# Patient Record
Sex: Female | Born: 1945
Health system: Southern US, Community
[De-identification: ages and names within clinical notes are randomized; demographics above are authoritative.]

## PROBLEM LIST (undated history)

## (undated) DIAGNOSIS — I10 Essential (primary) hypertension: Secondary | ICD-10-CM

## (undated) DIAGNOSIS — K449 Diaphragmatic hernia without obstruction or gangrene: Secondary | ICD-10-CM

## (undated) DIAGNOSIS — I509 Heart failure, unspecified: Secondary | ICD-10-CM

## (undated) DIAGNOSIS — E119 Type 2 diabetes mellitus without complications: Secondary | ICD-10-CM

## (undated) DIAGNOSIS — E785 Hyperlipidemia, unspecified: Secondary | ICD-10-CM

## (undated) DIAGNOSIS — H409 Unspecified glaucoma: Secondary | ICD-10-CM

## (undated) DIAGNOSIS — K259 Gastric ulcer, unspecified as acute or chronic, without hemorrhage or perforation: Secondary | ICD-10-CM

## (undated) DIAGNOSIS — D649 Anemia, unspecified: Secondary | ICD-10-CM

---

## 2011-08-20 ENCOUNTER — Emergency Department (HOSPITAL_COMMUNITY): Payer: Medicare Other

## 2011-08-20 ENCOUNTER — Emergency Department (HOSPITAL_COMMUNITY)
Admission: EM | Admit: 2011-08-20 | Discharge: 2011-08-20 | Disposition: A | Discharge status: Home / Self Care | Payer: Medicare Other | Attending: Emergency Medicine | Admitting: Emergency Medicine

## 2011-08-20 ENCOUNTER — Encounter (HOSPITAL_COMMUNITY): Payer: Self-pay | Admitting: Cardiology

## 2011-08-20 DIAGNOSIS — R51 Headache: Secondary | ICD-10-CM

## 2011-08-20 DIAGNOSIS — I10 Essential (primary) hypertension: Secondary | ICD-10-CM | POA: Insufficient documentation

## 2011-08-20 DIAGNOSIS — R42 Dizziness and giddiness: Secondary | ICD-10-CM | POA: Insufficient documentation

## 2011-08-20 DIAGNOSIS — R110 Nausea: Secondary | ICD-10-CM | POA: Insufficient documentation

## 2011-08-20 HISTORY — DX: Essential (primary) hypertension: I10

## 2011-08-20 MED ORDER — IBUPROFEN 200 MG PO TABS
400.0000 mg | ORAL_TABLET | Freq: Once | ORAL | Status: AC
  Administered 2011-08-20: 400 mg via ORAL
  Filled 2011-08-20: qty 2

## 2011-08-20 MED ORDER — ACETAMINOPHEN 325 MG PO TABS
975.0000 mg | ORAL_TABLET | Freq: Once | ORAL | Status: AC
  Administered 2011-08-20: 975 mg via ORAL
  Filled 2011-08-20: qty 3

## 2011-08-20 NOTE — ED Notes (Signed)
Pt reports a generalized headache for a couple of days. States that she tried Aleve once without any relief. Denies any vision changes. Pt A&Ox4.

## 2011-08-20 NOTE — ED Provider Notes (Signed)
Medical screening examination/treatment/procedure(s) were performed by non-physician practitioner and as supervising physician I was immediately available for consultation/collaboration.   Rianna Lukes B. Bernette Mayers, MD 08/20/11 [phone]

## 2011-08-20 NOTE — ED Provider Notes (Signed)
History     CSN: [id number]  Arrival date & time 08/20/11  4098   First MD Initiated Contact with Patient 08/20/11 [phone]      Chief Complaint  Patient presents with  . Headache    (Consider location/radiation/quality/duration/timing/severity/associated sxs/prior treatment) HPI  Patient has history of hypertension and non-insulin-dependent diabetes who states that her primary care provider visits her at her home however she cannot remember the primary care providers name stating that she moved to [facility] area within the last year presents to emergency department complaining of headache. Patient states that she has had a headache over the last month intermittently. Patient states that some mornings she'll wake up with a headache and others she will wake up pain free. Patient states that on morning she wakes up with a headache she'll take her blood pressure medication and her diabetes medication and at times the headache goes away. Other times patient notes that she'll take her medications and she'll have ongoing headache. Patient has attempted to take Aleve for his headaches. Patient states the headache will resolve by 10 AM stating that she thinks the Aleve "may help a little." Patient states headache is associated with mild dizziness and mild nausea. Patient denies similar headache and her lifetime stating that over the last month at the first time she's had frequent recurrent headaches. She denies fevers, chills, neck stiffness, loss of coordination, difficulty ambulating, loss of vision, double vision, chest pain, shortness of breath, vomiting, extremity numbness/tingling/weakness, slurred speech, or facial droop. Patient states that when she woke this morning she had a more severe headache and took Aleve without any relief of pain and therefore presents to the emergency department for evaluation of her recurrent headaches.  Past Medical History  Diagnosis Date  . Hypertension     History  reviewed. No pertinent past surgical history.  History reviewed. No pertinent family history.  History  Substance Use Topics  . Smoking status: Not on file  . Smokeless tobacco: Not on file  . Alcohol Use:     OB History    Grav Para Term Preterm Abortions TAB SAB Ect Mult Living                  Review of Systems  All other systems reviewed and are negative.    Allergies  Review of patient's allergies indicates no known allergies.  Home Medications  No current outpatient prescriptions on file.  BP 157/82  Pulse 88  Temp 98.7 F (37.1 C) (Oral)  Resp 18  SpO2 99%  Physical Exam  Nursing note and vitals reviewed. Constitutional: She is oriented to person, place, and time. She appears well-developed and well-nourished. No distress.  HENT:  Head: Normocephalic and atraumatic.  Eyes: Conjunctivae and EOM are normal. Pupils are equal, round, and reactive to light.       No nystagmus  Neck: Normal range of motion. Neck supple.       No meningeal signs.   Cardiovascular: Normal rate, regular rhythm, normal heart sounds and intact distal pulses.  Exam reveals no gallop and no friction rub.   No murmur heard. Pulmonary/Chest: Effort normal and breath sounds normal. No respiratory distress. She has no wheezes. She has no rales. She exhibits no tenderness.  Abdominal: Soft. Bowel sounds are normal. She exhibits no distension and no mass. There is no tenderness. There is no rebound and no guarding.  Musculoskeletal: Normal range of motion. She exhibits no edema and no tenderness.  Neurological: She is  alert and oriented to person, place, and time. No cranial nerve deficit. Coordination normal.       No ataxia. Ambulating without difficulty.   Skin: Skin is warm and dry. No rash noted. She is not diaphoretic. No erythema.  Psychiatric: She has a normal mood and affect.    ED Course  Procedures (including critical care time)  PO tylenol and ibuprofen.   Labs Reviewed -  No data to display Ct Head Wo Contrast  08/20/2011  *RADIOLOGY REPORT*  Clinical Data: Headache, blurred vision, and dizziness for 2 days.  CT HEAD WITHOUT CONTRAST  Technique:  Contiguous axial images were obtained from the base of the skull through the vertex without contrast.  Comparison: None.  Findings: No acute cortical infarct, hemorrhage, or mass lesion is present.  The ventricles are of normal size.  No significant extra- axial fluid collection is present.  The paranasal sinuses and mastoid air cells are clear.  The osseous skull is intact.  IMPRESSION: Negative CT of the head.  Original Report Authenticated By: Jamesetta Orleans. MATTERN, M.D.     1. Headache     Case discussed with Dr. Bernette Mayers who is agreeable with assessment and plan.   MDM  Patient with intermittent recurrent headache x1 month with no neuro focal deficits or neurological findings is ambulating without difficulty. No worrisome signs for infectious with negative meningeal signs and afebrile. No concern for subarachnoid hemorrhage or any other intracranial bleed. Negative CT scan for mass or lesion. Spoke at length with patient about recurrent headaches and the need for primary care and/or headache wellness Center followup for further management of headaches. Patient voices her understanding and is agreeable to plan.        [place], [place] 08/20/11 [phone]

## 2011-08-20 NOTE — Discharge Instructions (Signed)
It is important to take a dose of aleve and or tylenol immediately at onset of headache and then you can alternate with the other medication as needed for ongoing headache. Follow up with your primary care provider to further discuss recurrent headaches and your blood pressure management or may seek additional evaluation and management by [facility] however return to ER at any time for emergent changing or worsening of symptoms.   Headaches, Frequently Asked Questions MIGRAINE HEADACHES Q: What is migraine? What causes it? How can I treat it? A: Generally, migraine headaches begin as a dull ache. Then they develop into a constant, throbbing, and pulsating pain. You may experience pain at the temples. You may experience pain at the front or back of one or both sides of the head. The pain is usually accompanied by a combination of:  Nausea.   Vomiting.   Sensitivity to light and noise.  Some people (about 15%) experience an aura (see below) before an attack. The cause of migraine is believed to be chemical reactions in the brain. Treatment for migraine may include over-the-counter or prescription medications. It may also include self-help techniques. These include relaxation training and biofeedback.  Q: What is an aura? A: About 15% of people with migraine get an "aura". This is a sign of neurological symptoms that occur before a migraine headache. You may see wavy or jagged lines, dots, or flashing lights. You might experience tunnel vision or blind spots in one or both eyes. The aura can include visual or auditory hallucinations (something imagined). It may include disruptions in smell (such as strange odors), taste or touch. Other symptoms include:  Numbness.   A "pins and needles" sensation.   Difficulty in recalling or speaking the correct word.  These neurological events may last as long as 60 minutes. These symptoms will fade as the headache begins. Q: What is a trigger? A:  Certain physical or environmental factors can lead to or "trigger" a migraine. These include:  Foods.   Hormonal changes.   Weather.   Stress.  It is important to remember that triggers are different for everyone. To help prevent migraine attacks, you need to figure out which triggers affect you. Keep a headache diary. This is a good way to track triggers. The diary will help you talk to your healthcare professional about your condition. Q: Does weather affect migraines? A: Bright sunshine, hot, humid conditions, and drastic changes in barometric pressure may lead to, or "trigger," a migraine attack in some people. But studies have shown that weather does not act as a trigger for everyone with migraines. Q: What is the link between migraine and hormones? A: Hormones start and regulate many of your body's functions. Hormones keep your body in balance within a constantly changing environment. The levels of hormones in your body are unbalanced at times. Examples are during menstruation, pregnancy, or menopause. That can lead to a migraine attack. In fact, about three quarters of all women with migraine report that their attacks are related to the menstrual cycle.  Q: Is there an increased risk of stroke for migraine sufferers? A: The likelihood of a migraine attack causing a stroke is very remote. That is not to say that migraine sufferers cannot have a stroke associated with their migraines. In persons under age 24, the most common associated factor for stroke is migraine headache. But over the course of a person's normal life span, the occurrence of migraine headache may actually be associated with  a reduced risk of dying from cerebrovascular disease due to stroke.  Q: What are acute medications for migraine? A: Acute medications are used to treat the pain of the headache after it has started. Examples over-the-counter medications, NSAIDs, ergots, and triptans.  Q: What are the triptans? A:  Triptans are the newest class of abortive medications. They are specifically targeted to treat migraine. Triptans are vasoconstrictors. They moderate some chemical reactions in the brain. The triptans work on receptors in your brain. Triptans help to restore the balance of a neurotransmitter called serotonin. Fluctuations in levels of serotonin are thought to be a main cause of migraine.  Q: Are over-the-counter medications for migraine effective? A: Over-the-counter, or "OTC," medications may be effective in relieving mild to moderate pain and associated symptoms of migraine. But you should see your caregiver before beginning any treatment regimen for migraine.  Q: What are preventive medications for migraine? A: Preventive medications for migraine are sometimes referred to as "prophylactic" treatments. They are used to reduce the frequency, severity, and length of migraine attacks. Examples of preventive medications include antiepileptic medications, antidepressants, beta-blockers, calcium channel blockers, and NSAIDs (nonsteroidal anti-inflammatory drugs). Q: Why are anticonvulsants used to treat migraine? A: During the past few years, there has been an increased interest in antiepileptic drugs for the prevention of migraine. They are sometimes referred to as "anticonvulsants". Both epilepsy and migraine may be caused by similar reactions in the brain.  Q: Why are antidepressants used to treat migraine? A: Antidepressants are typically used to treat people with depression. They may reduce migraine frequency by regulating chemical levels, such as serotonin, in the brain.  Q: What alternative therapies are used to treat migraine? A: The term "alternative therapies" is often used to describe treatments considered outside the scope of conventional Western medicine. Examples of alternative therapy include acupuncture, acupressure, and yoga. Another common alternative treatment is herbal therapy. Some herbs  are believed to relieve headache pain. Always discuss alternative therapies with your caregiver before proceeding. Some herbal products contain arsenic and other toxins. TENSION HEADACHES Q: What is a tension-type headache? What causes it? How can I treat it? A: Tension-type headaches occur randomly. They are often the result of temporary stress, anxiety, fatigue, or anger. Symptoms include soreness in your temples, a tightening band-like sensation around your head (a "vice-like" ache). Symptoms can also include a pulling feeling, pressure sensations, and contracting head and neck muscles. The headache begins in your forehead, temples, or the back of your head and neck. Treatment for tension-type headache may include over-the-counter or prescription medications. Treatment may also include self-help techniques such as relaxation training and biofeedback. CLUSTER HEADACHES Q: What is a cluster headache? What causes it? How can I treat it? A: Cluster headache gets its name because the attacks come in groups. The pain arrives with little, if any, warning. It is usually on one side of the head. A tearing or bloodshot eye and a runny nose on the same side of the headache may also accompany the pain. Cluster headaches are believed to be caused by chemical reactions in the brain. They have been described as the most severe and intense of any headache type. Treatment for cluster headache includes prescription medication and oxygen. SINUS HEADACHES Q: What is a sinus headache? What causes it? How can I treat it? A: When a cavity in the bones of the face and skull (a sinus) becomes inflamed, the inflammation will cause localized pain. This condition is usually the result of  an allergic reaction, a tumor, or an infection. If your headache is caused by a sinus blockage, such as an infection, you will probably have a fever. An x-ray will confirm a sinus blockage. Your caregiver's treatment might include antibiotics for  the infection, as well as antihistamines or decongestants.  REBOUND HEADACHES Q: What is a rebound headache? What causes it? How can I treat it? A: A pattern of taking acute headache medications too often can lead to a condition known as "rebound headache." A pattern of taking too much headache medication includes taking it more than 2 days per week or in excessive amounts. That means more than the label or a caregiver advises. With rebound headaches, your medications not only stop relieving pain, they actually begin to cause headaches. Doctors treat rebound headache by tapering the medication that is being overused. Sometimes your caregiver will gradually substitute a different type of treatment or medication. Stopping may be a challenge. Regularly overusing a medication increases the potential for serious side effects. Consult a caregiver if you regularly use headache medications more than 2 days per week or more than the label advises. ADDITIONAL QUESTIONS AND ANSWERS Q: What is biofeedback? A: Biofeedback is a self-help treatment. Biofeedback uses special equipment to monitor your body's involuntary physical responses. Biofeedback monitors:  Breathing.   Pulse.   Heart rate.   Temperature.   Muscle tension.   Brain activity.  Biofeedback helps you refine and perfect your relaxation exercises. You learn to control the physical responses that are related to stress. Once the technique has been mastered, you do not need the equipment any more. Q: Are headaches hereditary? A: Four out of five (80%) of people that suffer report a family history of migraine. Scientists are not sure if this is genetic or a family predisposition. Despite the uncertainty, a child has a 50% chance of having migraine if one parent suffers. The child has a 75% chance if both parents suffer.  Q: Can children get headaches? A: By the time they reach high school, most young people have experienced some type of headache.  Many safe and effective approaches or medications can prevent a headache from occurring or stop it after it has begun.  Q: What type of doctor should I see to diagnose and treat my headache? A: Start with your primary caregiver. Discuss his or her experience and approach to headaches. Discuss methods of classification, diagnosis, and treatment. Your caregiver may decide to recommend you to a headache specialist, depending upon your symptoms or other physical conditions. Having diabetes, allergies, etc., may require a more comprehensive and inclusive approach to your headache. The [employer] will provide, upon request, a list of [facility] physician members in your state. Document Released: 04/27/2003 Document Revised: 01/24/2011 Document Reviewed: 10/05/2007 [facility] Patient Information 2012 [place], [place].

## 2012-09-10 ENCOUNTER — Other Ambulatory Visit (HOSPITAL_COMMUNITY): Payer: Self-pay | Admitting: Geriatric Medicine

## 2012-09-10 DIAGNOSIS — Z1231 Encounter for screening mammogram for malignant neoplasm of breast: Secondary | ICD-10-CM

## 2012-09-16 ENCOUNTER — Ambulatory Visit (HOSPITAL_COMMUNITY)
Admission: RE | Admit: 2012-09-16 | Discharge: 2012-09-16 | Disposition: A | Discharge status: Home / Self Care | Payer: Medicare Other | Source: Ambulatory Visit | Attending: Geriatric Medicine | Admitting: Geriatric Medicine

## 2012-09-16 DIAGNOSIS — Z1231 Encounter for screening mammogram for malignant neoplasm of breast: Secondary | ICD-10-CM

## 2012-11-26 ENCOUNTER — Encounter (HOSPITAL_COMMUNITY): Payer: Self-pay | Admitting: Emergency Medicine

## 2012-11-26 ENCOUNTER — Emergency Department (HOSPITAL_COMMUNITY)
Admission: EM | Admit: 2012-11-26 | Discharge: 2012-11-26 | Disposition: A | Discharge status: Home / Self Care | Payer: Medicare Other | Attending: Emergency Medicine | Admitting: Emergency Medicine

## 2012-11-26 ENCOUNTER — Emergency Department (HOSPITAL_COMMUNITY): Payer: Medicare Other

## 2012-11-26 DIAGNOSIS — N390 Urinary tract infection, site not specified: Secondary | ICD-10-CM | POA: Insufficient documentation

## 2012-11-26 DIAGNOSIS — E119 Type 2 diabetes mellitus without complications: Secondary | ICD-10-CM | POA: Insufficient documentation

## 2012-11-26 DIAGNOSIS — M7989 Other specified soft tissue disorders: Secondary | ICD-10-CM | POA: Insufficient documentation

## 2012-11-26 DIAGNOSIS — Z79899 Other long term (current) drug therapy: Secondary | ICD-10-CM | POA: Insufficient documentation

## 2012-11-26 DIAGNOSIS — I509 Heart failure, unspecified: Secondary | ICD-10-CM | POA: Insufficient documentation

## 2012-11-26 DIAGNOSIS — R0602 Shortness of breath: Secondary | ICD-10-CM | POA: Insufficient documentation

## 2012-11-26 DIAGNOSIS — R110 Nausea: Secondary | ICD-10-CM | POA: Insufficient documentation

## 2012-11-26 DIAGNOSIS — I10 Essential (primary) hypertension: Secondary | ICD-10-CM | POA: Insufficient documentation

## 2012-11-26 DIAGNOSIS — R5381 Other malaise: Secondary | ICD-10-CM | POA: Insufficient documentation

## 2012-11-26 DIAGNOSIS — R6883 Chills (without fever): Secondary | ICD-10-CM | POA: Insufficient documentation

## 2012-11-26 DIAGNOSIS — R61 Generalized hyperhidrosis: Secondary | ICD-10-CM | POA: Insufficient documentation

## 2012-11-26 HISTORY — DX: Heart failure, unspecified: I50.9

## 2012-11-26 HISTORY — DX: Type 2 diabetes mellitus without complications: E11.9

## 2012-11-26 LAB — CBC WITH DIFFERENTIAL/PLATELET
Basophils Absolute: 0 10*3/uL (ref 0.0–0.1)
Eosinophils Absolute: 0.1 10*3/uL (ref 0.0–0.7)
Eosinophils Relative: 1 % (ref 0–5)
MCH: 27.7 pg (ref 26.0–34.0)
MCV: 85.6 fL (ref 78.0–100.0)
Platelets: 448 10*3/uL — ABNORMAL HIGH (ref 150–400)
RDW: 14.1 % (ref 11.5–15.5)

## 2012-11-26 LAB — URINALYSIS, ROUTINE W REFLEX MICROSCOPIC
Bilirubin Urine: NEGATIVE
Glucose, UA: NEGATIVE mg/dL
Hgb urine dipstick: NEGATIVE
Ketones, ur: NEGATIVE mg/dL
Nitrite: NEGATIVE
Protein, ur: NEGATIVE mg/dL
Specific Gravity, Urine: 1.004 — ABNORMAL LOW (ref 1.005–1.030)
Urobilinogen, UA: 0.2 mg/dL (ref 0.0–1.0)
pH: 7.5 (ref 5.0–8.0)

## 2012-11-26 LAB — COMPREHENSIVE METABOLIC PANEL WITH GFR
ALT: 11 U/L (ref 0–35)
AST: 25 U/L (ref 0–37)
Albumin: 4.4 g/dL (ref 3.5–5.2)
Alkaline Phosphatase: 80 U/L (ref 39–117)
BUN: 12 mg/dL (ref 6–23)
CO2: 29 meq/L (ref 19–32)
Calcium: 9.1 mg/dL (ref 8.4–10.5)
Chloride: 95 meq/L — ABNORMAL LOW (ref 96–112)
Creatinine, Ser: 0.61 mg/dL (ref 0.50–1.10)
GFR calc Af Amer: >90 mL/min
GFR calc non Af Amer: >90 mL/min
Glucose, Bld: 100 mg/dL — ABNORMAL HIGH (ref 70–99)
Potassium: 3.8 meq/L (ref 3.5–5.1)
Sodium: 138 meq/L (ref 135–145)
Total Bilirubin: 0.2 mg/dL — ABNORMAL LOW (ref 0.3–1.2)
Total Protein: 8.5 g/dL — ABNORMAL HIGH (ref 6.0–8.3)

## 2012-11-26 LAB — URINE MICROSCOPIC-ADD ON

## 2012-11-26 LAB — PRO B NATRIURETIC PEPTIDE: Pro B Natriuretic peptide (BNP): 54.1 pg/mL (ref 0–125)

## 2012-11-26 LAB — LIPASE, BLOOD: Lipase: 28 U/L (ref 11–59)

## 2012-11-26 LAB — TROPONIN I: Troponin I: <0.3 ng/mL

## 2012-11-26 MED ORDER — SODIUM CHLORIDE 0.9 % IV SOLN: INTRAVENOUS | Status: DC

## 2012-11-26 MED ORDER — ONDANSETRON 4 MG PO TBDP: 4.0000 mg | ORAL_TABLET | Freq: Three times a day (TID) | ORAL | Status: DC | PRN

## 2012-11-26 MED ORDER — CEPHALEXIN 500 MG PO CAPS: 500.0000 mg | ORAL_CAPSULE | Freq: Four times a day (QID) | ORAL | Status: DC

## 2012-11-26 MED ORDER — SODIUM CHLORIDE 0.9 % IV BOLUS (SEPSIS)
250.0000 mL | Freq: Once | INTRAVENOUS | Status: AC
  Administered 2012-11-26: 250 mL via INTRAVENOUS

## 2012-11-26 NOTE — ED Notes (Signed)
Pt placed back on monitor, continuous pulse oximetry and blood pressure; family at bedside

## 2012-11-26 NOTE — ED Notes (Addendum)
Pt brought by daughter for weakness/dizziness and nausea since the beginning of October.  Pt states nausea increases with smells and change in position.  Hx of chf, but denies need to increase lasix and denies sob.  Pt also c/o swollen lymph node to R groin.

## 2012-11-26 NOTE — ED Notes (Signed)
Pt placed on monitor, continuous pulse oximetry and blood pressure cuff; family at bedside 

## 2012-11-26 NOTE — ED Notes (Signed)
Pt d/c'd from IV, monitor, continuous pulse oximetry and blood pressure cuff; pt getting dressed to be discharged home; family at bedside 

## 2012-11-26 NOTE — ED Notes (Signed)
Family at bedside. 

## 2012-11-26 NOTE — ED Provider Notes (Signed)
CSN: [id number]     Arrival date & time 11/26/12  1247 History   First MD Initiated Contact with Patient 11/26/12 1305     Chief Complaint  Patient presents with  . Dizziness  . Weakness   (Consider location/radiation/quality/duration/timing/severity/associated sxs/prior Treatment) The history is provided by the patient and a relative.   patient is 67 year old female brought in by daughter. Patient lives with daughter. Patient primary care doctors and retract. Patient with a week and a half of right groin inner thighs swelling. Dizziness for about a week some nausea no vomiting no diarrhea no fever some diaphoresis some chills some shortness of breath no chest pain no abdominal pain. Patients is felt fatigued with dizziness and lightheadedness. No chest pain.  Past Medical History  Diagnosis Date  . Hypertension   . CHF (congestive heart failure)   . Diabetes mellitus without complication    History reviewed. No pertinent past surgical history. No family history on file. History  Substance Use Topics  . Smoking status: Never Smoker   . Smokeless tobacco: Not on file  . Alcohol Use: No   OB History   Grav Para Term Preterm Abortions TAB SAB Ect Mult Living                 Review of Systems  Constitutional: Positive for chills, diaphoresis and fatigue. Negative for fever.  Eyes: Negative for redness.  Respiratory: Positive for shortness of breath.   Cardiovascular: Negative for chest pain.  Gastrointestinal: Positive for nausea. Negative for vomiting, abdominal pain and diarrhea.  Genitourinary: Negative for dysuria.  Musculoskeletal: Negative for back pain.  Skin: Negative for rash.  Neurological: Positive for dizziness and light-headedness.  Hematological: Does not bruise/bleed easily.  Psychiatric/Behavioral: Negative for confusion.    Allergies  Review of patient's allergies indicates no known allergies.  Home Medications   Current Outpatient Rx  Name  Route  Sig   Dispense  Refill  . cetirizine (ZYRTEC) 10 MG tablet   Oral   Take 10 mg by mouth daily.         . cholecalciferol (VITAMIN D) 1000 UNITS tablet   Oral   Take 1,000 Units by mouth daily.         . cyclobenzaprine (FLEXERIL) 5 MG tablet   Oral   Take 5 mg by mouth 2 (two) times daily.         Marland Kitchen esomeprazole (NEXIUM) 40 MG capsule   Oral   Take 40 mg by mouth daily before breakfast.         . furosemide (LASIX) 20 MG tablet   Oral   Take 20 mg by mouth 2 (two) times daily.         Marland Kitchen GARLIC PO   Oral   Take 1 tablet by mouth daily.         . hydrALAZINE (APRESOLINE) 50 MG tablet   Oral   Take 75 mg by mouth 3 (three) times daily.         . hydrochlorothiazide (HYDRODIURIL) 25 MG tablet   Oral   Take 25 mg by mouth daily.         Marland Kitchen HYDROcodone-acetaminophen (NORCO/VICODIN) 5-325 MG per tablet   Oral   Take 1 tablet by mouth every 6 (six) hours as needed for pain.         . metFORMIN (GLUCOPHAGE) 500 MG tablet   Oral   Take 500 mg by mouth daily.         Marland Kitchen  metoprolol tartrate (LOPRESSOR) 25 MG tablet   Oral   Take 25 mg by mouth 2 (two) times daily.         Marland Kitchen omega-3 acid ethyl esters (LOVAZA) 1 G capsule   Oral   Take 2 g by mouth 2 (two) times daily.         . vitamin E 400 UNIT capsule   Oral   Take 400 Units by mouth daily.         . cephALEXin (KEFLEX) 500 MG capsule   Oral   Take 1 capsule (500 mg total) by mouth 4 (four) times daily.   28 capsule   0   . ondansetron (ZOFRAN ODT) 4 MG disintegrating tablet   Oral   Take 1 tablet (4 mg total) by mouth every 8 (eight) hours as needed for nausea.   20 tablet   0    BP 129/72  Pulse 67  Temp(Src) 98.1 F (36.7 C) (Oral)  Resp 18  Ht 5\' 8"  (1.727 m)  Wt 238 lb 6.4 oz (108.138 kg)  BMI 36.26 kg/m2  SpO2 96% Physical Exam  Nursing note and vitals reviewed. Constitutional: She is oriented to person, place, and time. She appears well-developed and well-nourished. No  distress.  HENT:  Head: Normocephalic and atraumatic.  Mouth/Throat: Oropharynx is clear and moist.  Eyes: Conjunctivae and EOM are normal. Pupils are equal, round, and reactive to light.  Neck: Normal range of motion.  Cardiovascular: Normal rate, regular rhythm and normal heart sounds.   No murmur heard. Pulmonary/Chest: Effort normal and breath sounds normal. No respiratory distress.  Abdominal: Soft. Bowel sounds are normal. There is no tenderness.  Musculoskeletal: Normal range of motion. She exhibits no edema.  Patient when she stands up as a 4-5 cm soft tissue mass upper inner right thigh feels a collection of veins. This goes away when she lays down not consistent with a hernia not consistent with a lipoma. Patient does have extensive verrucous veins on the lower part of that same leg.  Neurological: She is alert and oriented to person, place, and time. No cranial nerve deficit. She exhibits normal muscle tone. Coordination normal.  Skin: Skin is warm. No rash noted.    ED Course  Procedures (including critical care time) Labs Review Labs Reviewed  CBC WITH DIFFERENTIAL - Abnormal; Notable for the following:    Platelets 448 (*)    All other components within normal limits  COMPREHENSIVE METABOLIC PANEL - Abnormal; Notable for the following:    Chloride 95 (*)    Glucose, Bld 100 (*)    Total Protein 8.5 (*)    Total Bilirubin 0.2 (*)    All other components within normal limits  URINALYSIS, ROUTINE W REFLEX MICROSCOPIC - Abnormal; Notable for the following:    Specific Gravity, Urine 1.004 (*)    Leukocytes, UA LARGE (*)    All other components within normal limits  TROPONIN I  LIPASE, BLOOD  PRO B NATRIURETIC PEPTIDE  URINE MICROSCOPIC-ADD ON   Results for orders placed during the hospital encounter of 11/26/12  CBC WITH DIFFERENTIAL      Result Value Range   WBC 7.8  4.0 - 10.5 K/uL   RBC 4.59  3.87 - 5.11 MIL/uL   Hemoglobin 12.7  12.0 - 15.0 g/dL   HCT 16.1   09.6 - 04.5 %   MCV 85.6  78.0 - 100.0 fL   MCH 27.7  26.0 - 34.0 pg   MCHC  32.3  30.0 - 36.0 g/dL   RDW 54.0  98.1 - 19.1 %   Platelets 448 (*) 150 - 400 K/uL   Neutrophils Relative % 57  43 - 77 %   Neutro Abs 4.5  1.7 - 7.7 K/uL   Lymphocytes Relative 35  12 - 46 %   Lymphs Abs 2.7  0.7 - 4.0 K/uL   Monocytes Relative 6  3 - 12 %   Monocytes Absolute 0.5  0.1 - 1.0 K/uL   Eosinophils Relative 1  0 - 5 %   Eosinophils Absolute 0.1  0.0 - 0.7 K/uL   Basophils Relative 0  0 - 1 %   Basophils Absolute 0.0  0.0 - 0.1 K/uL  COMPREHENSIVE METABOLIC PANEL      Result Value Range   Sodium 138  135 - 145 mEq/L   Potassium 3.8  3.5 - 5.1 mEq/L   Chloride 95 (*) 96 - 112 mEq/L   CO2 29  19 - 32 mEq/L   Glucose, Bld 100 (*) 70 - 99 mg/dL   BUN 12  6 - 23 mg/dL   Creatinine, Ser 4.78  0.50 - 1.10 mg/dL   Calcium 9.1  8.4 - 29.5 mg/dL   Total Protein 8.5 (*) 6.0 - 8.3 g/dL   Albumin 4.4  3.5 - 5.2 g/dL   AST 25  0 - 37 U/L   ALT 11  0 - 35 U/L   Alkaline Phosphatase 80  39 - 117 U/L   Total Bilirubin 0.2 (*) 0.3 - 1.2 mg/dL   GFR calc non Af Amer >90  >90 mL/min   GFR calc Af Amer >90  >90 mL/min  TROPONIN I      Result Value Range   Troponin I <0.30  <0.30 ng/mL  LIPASE, BLOOD      Result Value Range   Lipase 28  11 - 59 U/L  URINALYSIS, ROUTINE W REFLEX MICROSCOPIC      Result Value Range   Color, Urine YELLOW  YELLOW   APPearance CLEAR  CLEAR   Specific Gravity, Urine 1.004 (*) 1.005 - 1.030   pH 7.5  5.0 - 8.0   Glucose, UA NEGATIVE  NEGATIVE mg/dL   Hgb urine dipstick NEGATIVE  NEGATIVE   Bilirubin Urine NEGATIVE  NEGATIVE   Ketones, ur NEGATIVE  NEGATIVE mg/dL   Protein, ur NEGATIVE  NEGATIVE mg/dL   Urobilinogen, UA 0.2  0.0 - 1.0 mg/dL   Nitrite NEGATIVE  NEGATIVE   Leukocytes, UA LARGE (*) NEGATIVE  PRO B NATRIURETIC PEPTIDE      Result Value Range   Pro B Natriuretic peptide (BNP) 54.1  0 - 125 pg/mL  URINE MICROSCOPIC-ADD ON      Result Value Range   Squamous  Epithelial / LPF RARE  RARE   WBC, UA 3-6  <3 WBC/hpf   RBC / HPF 0-2  <3 RBC/hpf   Bacteria, UA RARE  RARE   Urine-Other MUCOUS PRESENT      Imaging Review Dg Chest 2 View  11/26/2012   CLINICAL DATA:  67 year old female with left lower chest pain, shortness of breath, dizziness, nausea.  EXAM: CHEST  2 VIEW  COMPARISON:  None.  FINDINGS: Cardiac size within normal limits. Tortuous descending thoracic aorta. Other mediastinal contours are within normal limits. Visualized tracheal air column is within normal limits. No pneumothorax or pulmonary edema. No pleural effusion or confluent pulmonary opacity. Suspect hiatal hernia. No acute osseous abnormality identified.  IMPRESSION:  No acute cardiopulmonary abnormality.  Hiatal hernia suspected.   Electronically Signed   By: Augusto Gamble M.D.   On: 11/26/2012 15:06   Dg Hip Bilateral W/pelvis  11/26/2012   CLINICAL DATA:  67 year old female with bilateral groin pain. Fall 1 year ago with persistent pain.  EXAM: BILATERAL HIP WITH PELVIS - 4+ VIEW  COMPARISON:  None.  FINDINGS: Diastases of the pubic symphysis, 12.5 mm. Pelvis otherwise appears intact. Mildly asymmetric sclerosis along the SI joints. Sacral ala appear intact.  Normal left hip joint space. Proximal left femur intact.  Normal right hip joint space. Proximal right femur intact.  IMPRESSION: 1. There is a degree of pubic symphysis diastases, probably chronic in this setting. Asymmetric bilateral SI joint sclerosis may reflect associated SI joint degeneration due to altered biomechanics.  2. Hip joint spaces are within normal limits. No acute fracture or dislocation identified.   Electronically Signed   By: Augusto Gamble M.D.   On: 11/26/2012 15:09   Ct Head Wo Contrast  11/26/2012   CLINICAL DATA:  Weakness, dizziness, nausea, history of hypertension, diabetes, CHF  EXAM: CT HEAD WITHOUT CONTRAST  TECHNIQUE: Contiguous axial images were obtained from the base of the skull through the vertex without  intravenous contrast.  COMPARISON:  08/20/2011  FINDINGS: Normal ventricular morphology.  No midline shift or mass effect.  Scattered dural calcifications within falx.  Normal appearance of brain parenchyma.  No definite intracranial hemorrhage, mass lesion, or evidence acute infarction.  No extra-axial fluid collections.  Bones and sinuses unremarkable  IMPRESSION: No acute intracranial abnormalities.   Electronically Signed   By: Ulyses Southward M.D.   On: 11/26/2012 16:19    EKG Interpretation   None      Results for orders placed during the hospital encounter of 11/26/12  CBC WITH DIFFERENTIAL      Result Value Range   WBC 7.8  4.0 - 10.5 K/uL   RBC 4.59  3.87 - 5.11 MIL/uL   Hemoglobin 12.7  12.0 - 15.0 g/dL   HCT 08.6  57.8 - 46.9 %   MCV 85.6  78.0 - 100.0 fL   MCH 27.7  26.0 - 34.0 pg   MCHC 32.3  30.0 - 36.0 g/dL   RDW 62.9  52.8 - 41.3 %   Platelets 448 (*) 150 - 400 K/uL   Neutrophils Relative % 57  43 - 77 %   Neutro Abs 4.5  1.7 - 7.7 K/uL   Lymphocytes Relative 35  12 - 46 %   Lymphs Abs 2.7  0.7 - 4.0 K/uL   Monocytes Relative 6  3 - 12 %   Monocytes Absolute 0.5  0.1 - 1.0 K/uL   Eosinophils Relative 1  0 - 5 %   Eosinophils Absolute 0.1  0.0 - 0.7 K/uL   Basophils Relative 0  0 - 1 %   Basophils Absolute 0.0  0.0 - 0.1 K/uL  COMPREHENSIVE METABOLIC PANEL      Result Value Range   Sodium 138  135 - 145 mEq/L   Potassium 3.8  3.5 - 5.1 mEq/L   Chloride 95 (*) 96 - 112 mEq/L   CO2 29  19 - 32 mEq/L   Glucose, Bld 100 (*) 70 - 99 mg/dL   BUN 12  6 - 23 mg/dL   Creatinine, Ser 2.44  0.50 - 1.10 mg/dL   Calcium 9.1  8.4 - 01.0 mg/dL   Total Protein 8.5 (*) 6.0 - 8.3 g/dL  Albumin 4.4  3.5 - 5.2 g/dL   AST 25  0 - 37 U/L   ALT 11  0 - 35 U/L   Alkaline Phosphatase 80  39 - 117 U/L   Total Bilirubin 0.2 (*) 0.3 - 1.2 mg/dL   GFR calc non Af Amer >90  >90 mL/min   GFR calc Af Amer >90  >90 mL/min  TROPONIN I      Result Value Range   Troponin I <0.30  <0.30  ng/mL  LIPASE, BLOOD      Result Value Range   Lipase 28  11 - 59 U/L  URINALYSIS, ROUTINE W REFLEX MICROSCOPIC      Result Value Range   Color, Urine YELLOW  YELLOW   APPearance CLEAR  CLEAR   Specific Gravity, Urine 1.004 (*) 1.005 - 1.030   pH 7.5  5.0 - 8.0   Glucose, UA NEGATIVE  NEGATIVE mg/dL   Hgb urine dipstick NEGATIVE  NEGATIVE   Bilirubin Urine NEGATIVE  NEGATIVE   Ketones, ur NEGATIVE  NEGATIVE mg/dL   Protein, ur NEGATIVE  NEGATIVE mg/dL   Urobilinogen, UA 0.2  0.0 - 1.0 mg/dL   Nitrite NEGATIVE  NEGATIVE   Leukocytes, UA LARGE (*) NEGATIVE  PRO B NATRIURETIC PEPTIDE      Result Value Range   Pro B Natriuretic peptide (BNP) 54.1  0 - 125 pg/mL  URINE MICROSCOPIC-ADD ON      Result Value Range   Squamous Epithelial / LPF RARE  RARE   WBC, UA 3-6  <3 WBC/hpf   RBC / HPF 0-2  <3 RBC/hpf   Bacteria, UA RARE  RARE   Urine-Other MUCOUS PRESENT      Date: 11/26/2012  Rate: 64  Rhythm: normal sinus rhythm  QRS Axis: left  Intervals: normal  ST/T Wave abnormalities: nonspecific ST/T changes  Conduction Disutrbances:none  Narrative Interpretation:   Old EKG Reviewed: none available    MDM   1. UTI (lower urinary tract infection)    Workup for the patient's symptoms without any significant findings other than perhaps early urinary tract infection. We'll treat that with Keflex and she is a diabetic. Has followup with primary care Dr. as needed. EKG without acute cardiac changes no evidence of a silent MI troponin was negative. No significant.chest x-ray findings to suggest pneumonia pulmonary edema or pneumothorax. No leukocytosis no anemia no significant electrolyte abnormalities renal function is normal.  The mass on her right inner thigh bulges out when she stands up this would be consistent with like a collection of varicose veins. Does not seem to be consistent with a lipoma. Goes away when she lays down. Also not consistent with a hernia.   Shelda Jakes, MD 11/26/12 [phone]

## 2012-11-26 NOTE — ED Notes (Signed)
Pt getting undressed and into a gown at this time; family at bedside 

## 2012-11-26 NOTE — ED Notes (Signed)
Pt to xray at this time.

## 2014-01-11 ENCOUNTER — Ambulatory Visit (INDEPENDENT_AMBULATORY_CARE_PROVIDER_SITE_OTHER): Payer: Medicare Other | Admitting: Podiatry

## 2014-01-11 ENCOUNTER — Ambulatory Visit (INDEPENDENT_AMBULATORY_CARE_PROVIDER_SITE_OTHER): Payer: Medicare Other

## 2014-01-11 ENCOUNTER — Encounter: Payer: Self-pay | Admitting: Podiatry

## 2014-01-11 VITALS — BP 115/69 | HR 66 | Resp 16 | Ht 71.0 in | Wt 230.0 lb

## 2014-01-11 DIAGNOSIS — M779 Enthesopathy, unspecified: Secondary | ICD-10-CM

## 2014-01-11 DIAGNOSIS — Q6650 Congenital pes planus, unspecified foot: Secondary | ICD-10-CM

## 2014-01-11 DIAGNOSIS — M722 Plantar fascial fibromatosis: Secondary | ICD-10-CM

## 2014-01-11 MED ORDER — MELOXICAM 15 MG PO TABS: 15.0000 mg | ORAL_TABLET | Freq: Every day | ORAL | Status: DC

## 2014-01-11 NOTE — Progress Notes (Signed)
   Subjective:    Patient ID: Susan BeachBetty Souter, female    DOB: [birth year], 68 y.o.   MRN: [id number]  HPI Comments: Left ankle pain medial side of the foot , and the bottom of the foot. Been going on for 2-3 months now  Diabetic for years , last blood sugar 111  Foot Pain Associated symptoms include coughing.      Review of Systems  HENT: Positive for sneezing.        Sinus problems   Respiratory: Positive for cough.   Endocrine:       Diabetes   Musculoskeletal:       Muscle pain  Difficulty walking   All other systems reviewed and are negative.      Objective:   Physical Exam I have reviewed her past mental history medications allergies surgery social history and review of systems. Pulses are strongly palpable bilateral. Neurologic sensorium is intact versus we'll see monofilament. Deep tendon reflexes are intact bilateral muscle strength +5 over 5 dorsiflexion plantar flexors and inverters everters all intrinsic musculature is intact. Orthopedic evaluation demonstrates all joints distal to the ankle have a full range of motion without crepitation. She has pain on palpation of the posterior tibial tendon with swelling overlying the tendon distal to the medial malleolar region extending to the level of the navicular tuberosity left. Pes planus is flexible in nature left. She also has pain on palpation medial calcaneal tubercle of the left heel. Radiographic evaluation does demonstrate soft tissue increase in density at the talonavicular region. Also at the plantar fascial calcaneal insertion site.        Assessment & Plan:  Assessment: Pes planus plantar fasciitis posterior tibial tendinitis all left.  Plan: Injected the plantar fascia today with Kenalog and local anesthetic. Injected the point of maximal tenderness avoiding the posterior tibial tendon with dexamethasone and local anesthetic. I then placed her in a Cam Walker and wrote a prescription for meloxicam 7.5 mg. I will  follow-up with her 1 month.

## 2014-02-08 ENCOUNTER — Ambulatory Visit (INDEPENDENT_AMBULATORY_CARE_PROVIDER_SITE_OTHER): Payer: Medicare Other | Admitting: Podiatry

## 2014-02-08 ENCOUNTER — Encounter: Payer: Self-pay | Admitting: Podiatry

## 2014-02-08 VITALS — BP 135/68 | HR 68 | Resp 16

## 2014-02-08 DIAGNOSIS — M722 Plantar fascial fibromatosis: Secondary | ICD-10-CM

## 2014-02-08 DIAGNOSIS — M779 Enthesopathy, unspecified: Secondary | ICD-10-CM

## 2014-02-08 DIAGNOSIS — Q6650 Congenital pes planus, unspecified foot: Secondary | ICD-10-CM

## 2014-02-08 MED ORDER — DICLOFENAC SODIUM 75 MG PO TBEC: 75.0000 mg | DELAYED_RELEASE_TABLET | Freq: Two times a day (BID) | ORAL | Status: DC

## 2014-02-08 NOTE — Progress Notes (Signed)
She presents for follow-up of her plantar fasciitis left heel and posterior tibial tendinitis left foot. She states that she was unable to take the meloxicam because it upset her stomach. She's been wearing her Cam Walker for the past month.  Objective: Much decreased in edema to the left lower extremity however she still has tenderness and fluctuance on palpation of the posterior tibial tendon at its insertion site left ankle and foot.  Assessment: Posterior tibial tendon dysfunction Rule out a tear of the posterior tibial tendinitis left foot. Associated pes planus and plantar fasciitis.  Plan: Injected the point of maximal tenderness today for radiating pain along the tibialis anterior insertion site. Dexamethasone was injected with local anesthetic. I wrote a prescription for diclofenac 75 mg 1 by mouth twice a day. Follow up with her in the near future.

## 2014-02-25 DIAGNOSIS — Z0000 Encounter for general adult medical examination without abnormal findings: Secondary | ICD-10-CM | POA: Diagnosis not present

## 2014-03-08 ENCOUNTER — Ambulatory Visit: Payer: Medicare Other | Admitting: Podiatry

## 2014-03-15 ENCOUNTER — Ambulatory Visit: Payer: Medicare Other | Admitting: Podiatry

## 2014-03-31 ENCOUNTER — Ambulatory Visit: Payer: Medicare Other | Admitting: Podiatry

## 2014-03-31 DIAGNOSIS — E119 Type 2 diabetes mellitus without complications: Secondary | ICD-10-CM | POA: Diagnosis not present

## 2014-03-31 DIAGNOSIS — G4700 Insomnia, unspecified: Secondary | ICD-10-CM | POA: Diagnosis not present

## 2014-03-31 DIAGNOSIS — G8929 Other chronic pain: Secondary | ICD-10-CM | POA: Diagnosis not present

## 2014-03-31 DIAGNOSIS — M1990 Unspecified osteoarthritis, unspecified site: Secondary | ICD-10-CM | POA: Diagnosis not present

## 2014-03-31 DIAGNOSIS — I119 Hypertensive heart disease without heart failure: Secondary | ICD-10-CM | POA: Diagnosis not present

## 2014-03-31 DIAGNOSIS — E559 Vitamin D deficiency, unspecified: Secondary | ICD-10-CM | POA: Diagnosis not present

## 2014-03-31 DIAGNOSIS — J310 Chronic rhinitis: Secondary | ICD-10-CM | POA: Diagnosis not present

## 2014-04-05 ENCOUNTER — Other Ambulatory Visit: Payer: Self-pay | Admitting: Podiatry

## 2014-05-13 DIAGNOSIS — G4700 Insomnia, unspecified: Secondary | ICD-10-CM | POA: Diagnosis not present

## 2014-05-13 DIAGNOSIS — M1990 Unspecified osteoarthritis, unspecified site: Secondary | ICD-10-CM | POA: Diagnosis not present

## 2014-05-13 DIAGNOSIS — G8929 Other chronic pain: Secondary | ICD-10-CM | POA: Diagnosis not present

## 2014-05-13 DIAGNOSIS — I119 Hypertensive heart disease without heart failure: Secondary | ICD-10-CM | POA: Diagnosis not present

## 2014-05-13 DIAGNOSIS — E119 Type 2 diabetes mellitus without complications: Secondary | ICD-10-CM | POA: Diagnosis not present

## 2014-05-13 DIAGNOSIS — J310 Chronic rhinitis: Secondary | ICD-10-CM | POA: Diagnosis not present

## 2014-05-13 DIAGNOSIS — E559 Vitamin D deficiency, unspecified: Secondary | ICD-10-CM | POA: Diagnosis not present

## 2014-08-19 DIAGNOSIS — G4700 Insomnia, unspecified: Secondary | ICD-10-CM | POA: Diagnosis not present

## 2014-08-19 DIAGNOSIS — I119 Hypertensive heart disease without heart failure: Secondary | ICD-10-CM | POA: Diagnosis not present

## 2014-08-19 DIAGNOSIS — J310 Chronic rhinitis: Secondary | ICD-10-CM | POA: Diagnosis not present

## 2014-08-19 DIAGNOSIS — E119 Type 2 diabetes mellitus without complications: Secondary | ICD-10-CM | POA: Diagnosis not present

## 2014-08-19 DIAGNOSIS — E559 Vitamin D deficiency, unspecified: Secondary | ICD-10-CM | POA: Diagnosis not present

## 2014-08-19 DIAGNOSIS — G8929 Other chronic pain: Secondary | ICD-10-CM | POA: Diagnosis not present

## 2014-08-19 DIAGNOSIS — M1990 Unspecified osteoarthritis, unspecified site: Secondary | ICD-10-CM | POA: Diagnosis not present

## 2014-08-19 DIAGNOSIS — I10 Essential (primary) hypertension: Secondary | ICD-10-CM | POA: Diagnosis not present

## 2014-11-22 ENCOUNTER — Other Ambulatory Visit (INDEPENDENT_AMBULATORY_CARE_PROVIDER_SITE_OTHER): Payer: Medicare Other

## 2014-11-22 ENCOUNTER — Encounter: Payer: Self-pay | Admitting: Internal Medicine

## 2014-11-22 ENCOUNTER — Ambulatory Visit (INDEPENDENT_AMBULATORY_CARE_PROVIDER_SITE_OTHER): Payer: Medicare Other | Admitting: Internal Medicine

## 2014-11-22 VITALS — BP 110/50 | HR 55 | Temp 98.1°F | Ht 71.0 in | Wt 219.0 lb

## 2014-11-22 DIAGNOSIS — M1990 Unspecified osteoarthritis, unspecified site: Secondary | ICD-10-CM | POA: Diagnosis not present

## 2014-11-22 DIAGNOSIS — I519 Heart disease, unspecified: Secondary | ICD-10-CM | POA: Diagnosis not present

## 2014-11-22 DIAGNOSIS — Z23 Encounter for immunization: Secondary | ICD-10-CM

## 2014-11-22 DIAGNOSIS — E119 Type 2 diabetes mellitus without complications: Secondary | ICD-10-CM

## 2014-11-22 DIAGNOSIS — I8390 Asymptomatic varicose veins of unspecified lower extremity: Secondary | ICD-10-CM | POA: Insufficient documentation

## 2014-11-22 DIAGNOSIS — I8000 Phlebitis and thrombophlebitis of superficial vessels of unspecified lower extremity: Secondary | ICD-10-CM | POA: Insufficient documentation

## 2014-11-22 DIAGNOSIS — H409 Unspecified glaucoma: Secondary | ICD-10-CM | POA: Insufficient documentation

## 2014-11-22 DIAGNOSIS — I10 Essential (primary) hypertension: Secondary | ICD-10-CM | POA: Diagnosis not present

## 2014-11-22 DIAGNOSIS — I252 Old myocardial infarction: Secondary | ICD-10-CM

## 2014-11-22 DIAGNOSIS — K219 Gastro-esophageal reflux disease without esophagitis: Secondary | ICD-10-CM

## 2014-11-22 DIAGNOSIS — I8001 Phlebitis and thrombophlebitis of superficial vessels of right lower extremity: Secondary | ICD-10-CM

## 2014-11-22 LAB — CBC WITH DIFFERENTIAL/PLATELET
Basophils Absolute: 0 10*3/uL (ref 0.0–0.1)
Basophils Relative: 0.3 % (ref 0.0–3.0)
EOS PCT: 2.4 % (ref 0.0–5.0)
Eosinophils Absolute: 0.1 10*3/uL (ref 0.0–0.7)
HCT: 38.2 % (ref 36.0–46.0)
HEMOGLOBIN: 12.1 g/dL (ref 12.0–15.0)
Lymphocytes Relative: 37.4 % (ref 12.0–46.0)
Lymphs Abs: 2.2 10*3/uL (ref 0.7–4.0)
MCHC: 31.7 g/dL (ref 30.0–36.0)
MCV: 88.3 fl (ref 78.0–100.0)
MONO ABS: 0.6 10*3/uL (ref 0.1–1.0)
Monocytes Relative: 10.9 % (ref 3.0–12.0)
Neutro Abs: 2.9 10*3/uL (ref 1.4–7.7)
Neutrophils Relative %: 49 % (ref 43.0–77.0)
Platelets: 381 10*3/uL (ref 150.0–400.0)
RBC: 4.33 Mil/uL (ref 3.87–5.11)
RDW: 13.7 % (ref 11.5–15.5)
WBC: 5.8 10*3/uL (ref 4.0–10.5)

## 2014-11-22 LAB — MICROALBUMIN / CREATININE URINE RATIO
Creatinine,U: 66.8 mg/dL
Microalb Creat Ratio: 1 mg/g (ref 0.0–30.0)
Microalb, Ur: <0.7 mg/dL (ref 0.0–1.9)

## 2014-11-22 LAB — COMPREHENSIVE METABOLIC PANEL
ALK PHOS: 47 U/L (ref 39–117)
ALT: 7 U/L (ref 0–35)
AST: 13 U/L (ref 0–37)
Albumin: 4.3 g/dL (ref 3.5–5.2)
BILIRUBIN TOTAL: 0.4 mg/dL (ref 0.2–1.2)
BUN: 11 mg/dL (ref 6–23)
CO2: 30 mEq/L (ref 19–32)
Calcium: 9.7 mg/dL (ref 8.4–10.5)
Chloride: 104 mEq/L (ref 96–112)
Creatinine, Ser: 0.59 mg/dL (ref 0.40–1.20)
GFR: 129.55 mL/min (ref >60.00)
GLUCOSE: 94 mg/dL (ref 70–99)
Potassium: 3.9 mEq/L (ref 3.5–5.1)
SODIUM: 142 meq/L (ref 135–145)
TOTAL PROTEIN: 7.2 g/dL (ref 6.0–8.3)

## 2014-11-22 LAB — LIPID PANEL
CHOLESTEROL: 201 mg/dL — AB (ref 0–200)
HDL: 61.1 mg/dL (ref >39.00)
LDL Cholesterol: 107 mg/dL — ABNORMAL HIGH (ref 0–99)
NonHDL: 139.55
Total CHOL/HDL Ratio: 3
Triglycerides: 164 mg/dL — ABNORMAL HIGH (ref 0.0–149.0)
VLDL: 32.8 mg/dL (ref 0.0–40.0)

## 2014-11-22 LAB — HEMOGLOBIN A1C: HEMOGLOBIN A1C: 6 % (ref 4.6–6.5)

## 2014-11-22 LAB — TSH: TSH: 0.41 u[IU]/mL (ref 0.35–4.50)

## 2014-11-22 NOTE — Assessment & Plan Note (Addendum)
Type 2, controlled according to home measurements, last a1c unknown No complications Check a1c, cmp, lipids Continue metformin at current dose Need to work on compliance with diabetic diet and increasing exercise Due for an eye appointment - she will schedule Follow up in 3 months

## 2014-11-22 NOTE — Assessment & Plan Note (Signed)
Superficial vein red, swollen and tender No evidence of DVT on exam Elevate leg when at rest, but do not remain sedentary Warm compresses Start advil 600 mg TID with food - stop if stomach becomes upset and hold diclofenac while taking advil Call if no improvement or worsening

## 2014-11-22 NOTE — Assessment & Plan Note (Signed)
Prior MI according to daughter - we need to get previous medical records Not following with cardiology Currently asymptomatic Not on a statin - has never been on one Check lipid panel and start lipitor if LFTs are ok

## 2014-11-22 NOTE — Progress Notes (Signed)
Pre visit review using our clinic review tool, if applicable. No additional management support is needed unless otherwise documented below in the visit note. 

## 2014-11-22 NOTE — Addendum Note (Signed)
Addended by: Donnita Falls on: 11/22/2014 11:32 AM   Modules accepted: Orders

## 2014-11-22 NOTE — Patient Instructions (Addendum)
It was nice to meet you today.  Test(s) ordered today. Your results will be released to MyChart (or called to you) after review, usually within 72hours after test completion. If any changes need to be made, you will be notified at that same time.  You should have your mammogram and make an eye appointment for your routine eye exam.  You received the flu vaccine today.   Medications reviewed and updated.  You should discontinue the nexium and try using Tums or Rolaids as needed for heartburn.   For the swollen, painful vein in your right leg you should apply warm compresses, elevate the leg when at rest and start taking advil 600 mg three times daily with your meals.  Hold the diclofenac while taking the advil. Stop the advil and restart the diclofenac when your symptoms have resolved.   If you have stomach upset stop the advil.  Please schedule followup in 3 months  Phlebitis Phlebitis is soreness and swelling (inflammation) of a vein. This can occur in your arms, legs, or torso (trunk), as well as deeper inside your body. Phlebitis is usually not serious when it occurs close to the surface of the body. However, it can cause serious problems when it occurs in a vein deeper inside the body. CAUSES  Phlebitis can be triggered by various things, including:   Reduced blood flow through your veins. This can happen with:  Bed rest over a long period.  Long-distance travel.  Injury.  Surgery.  Being overweight (obese) or pregnant.  Having an IV tube put in the vein and getting certain medicines through the vein.  Cancer and cancer treatment.  Use of illegal drugs taken through the vein.  Inflammatory diseases.  Inherited (genetic) diseases that increase the risk of blood clots.  Hormone therapy, such as birth control pills. SIGNS AND SYMPTOMS   Red, tender, swollen, and painful area on your skin. Usually, the area will be long and narrow.  Firmness along the center of the  affected area. This can indicate that a blood clot has formed.  Low-grade fever. DIAGNOSIS  A health care provider can usually diagnose phlebitis by examining the affected area and asking about your symptoms. To check for infection or blood clots, your health care provider may order blood tests or an ultrasound exam of the area. Blood tests and your family history may also indicate if you have an underlying genetic disease that causes blood clots. Occasionally, a piece of tissue is taken from the body (biopsy sample) if an unusual cause of phlebitis is suspected. TREATMENT  Treatment will vary depending on the severity of the condition and the area of the body affected. Treatment may include:  Use of a warm compress or heating pad.  Use of compression stockings or bandages.  Anti-inflammatory medicines.  Removal of any IV tube that may be causing the problem.  Medicines that kill germs (antibiotics) if an infection is present.  Blood-thinning medicines if a blood clot is suspected or present.  In rare cases, surgery may be needed to remove damaged sections of vein. HOME CARE INSTRUCTIONS   Only take over-the-counter or prescription medicines as directed by your health care provider. Take all medicines exactly as prescribed.  Raise (elevate) the affected area above the level of your heart as directed by your health care provider.  Apply a warm compress or heating pad to the affected area as directed by your health care provider. Do not sleep with the heating pad.  Use  compression stockings or bandages as directed. These will speed healing and prevent the condition from coming back.  If you are on blood thinners:  Get follow-up blood tests as directed by your health care provider.  Check with your health care provider before using any new medicines.  Carry a medical alert card or wear your medical alert jewelry to show that you are on blood thinners.  For phlebitis in the  legs:  Avoid prolonged standing or bed rest.  Keep your legs moving. Raise your legs when sitting or lying.  Do not smoke.  Women, particularly those over the age of 33, should consider the risks and benefits of taking the contraceptive pill. This kind of hormone treatment can increase your risk for blood clots.  Follow up with your health care provider as directed. SEEK MEDICAL CARE IF:   You have unusual bruising or any bleeding problems.  Your swelling or pain in the affected area is not improving.  You are on anti-inflammatory medicine, and you develop belly (abdominal) pain. SEEK IMMEDIATE MEDICAL CARE IF:   You have a sudden onset of chest pain or difficulty breathing.  You have a fever or persistent symptoms for more than 2-3 days.  You have a fever and your symptoms suddenly get worse. MAKE SURE YOU:  Understand these instructions.  Will watch your condition.  Will get help right away if you are not doing well or get worse. Document Released: 01/29/2001 Document Revised: 11/25/2012 Document Reviewed: 10/12/2012 [facility] Patient Information 2015 [place], [place]. This information is not intended to replace advice given to you by your health care provider. Make sure you discuss any questions you have with your health care provider.

## 2014-11-22 NOTE — Assessment & Plan Note (Signed)
Occasional, twice a month Stop nexium as needed and try an otc antacid Will do more in depth counseling on foods to avoid at next visit

## 2014-11-22 NOTE — Assessment & Plan Note (Signed)
BP at goal of less than 140/90 Continue current medication at current doses Check routine blood work, including cmp

## 2014-11-22 NOTE — Progress Notes (Signed)
Subjective:    Patient ID: Susan Brady, female    DOB: 1945-10-10, 69 y.o.   MRN: [id number]  HPI  She is here to establish with a new pcp.  Her daughter is here with her today.   Diabetes: She is taking her medication daily as prescribed. She is not compliant with a diabetic diet. She states she walks a lot, but is not exercising regularly. She monitors her sugars and they have been running 140's. She checks her feet daily and denies foot lesions. She is not up-to-date with an ophthalmology examination - her last eye exam was 1 1/2 years ago.   Hypertension: She is taking her medication daily. She is not compliant with a low sodium diet.  She has intermittent palpitations, but they are chronic and unchanged.  She denies chest pain, edema, shortness of breath and regular headaches. She is active, but not exercising regularly.    Swollen right leg vein:  She has b/l lower extremity varicose veins and for the past week the right leg has a red, swollen, and painful vein.  She has been very active over the past month and has not been able to rest.  She has tried warm compresses intermittently, but they have not helped.   GERD:  She has occaissonal GERD - once every two weeks.  She was recently prescribed nexium and she only takes it when she has heartburn.  She is unsure how well it has worked.  She does not take any otc antacids.   Heart disease, prior MI, history of CHF:  Her medical records are not available.  Her daughter states she had a small heart attack years ago.  She had an episode of CHF and takes lasix daily.  She denies chest pain, sob and edema.    Arthritis:  She has pain in her right shoulder and knee after she fell years ago.  She takes diclofenac twice daily and vicodin only for severe pain, which is not often.    Medications and allergies reviewed with patient and updated if appropriate.  Past Medical History  Diagnosis Date  . Hypertension   . CHF (congestive heart  failure) (HCC)   . Diabetes mellitus without complication (HCC)     History reviewed. No pertinent past surgical history.  Social History   Social History  . Marital Status: Divorced    Spouse Name: N/A  . Number of Children: N/A  . Years of Education: N/A   Social History Main Topics  . Smoking status: Former Games developer  . Smokeless tobacco: None  . Alcohol Use: No  . Drug Use: No  . Sexual Activity: Not Asked   Other Topics Concern  . None   Social History Narrative    Review of Systems  Constitutional: Negative for fever, chills, appetite change and unexpected weight change.       Low energy level  HENT: Negative for congestion, ear pain and sore throat.   Eyes:       Intermittent blurry vision  Respiratory: Negative for cough, shortness of breath and wheezing.   Cardiovascular: Positive for palpitations (intermittent, chronic and unchanged). Negative for chest pain and leg swelling.  Gastrointestinal: Negative for nausea, abdominal pain, diarrhea, constipation and blood in stool.       GERD about twice a month  Genitourinary: Negative for dysuria and hematuria.  Musculoskeletal: Positive for arthralgias (right knee, right shoulder).  Neurological: Positive for headaches (occasional). Negative for dizziness, light-headedness and numbness.  Psychiatric/Behavioral:  Negative for dysphoric mood. The patient is not nervous/anxious.        Objective:   Filed Vitals:   11/22/14 1014  BP: 110/50  Pulse: 55  Temp: 98.1 F (36.7 C)   Filed Weights   11/22/14 1014  Weight: 219 lb (99.338 kg)   Body mass index is 30.56 kg/(m^2).   Physical Exam  Constitutional: She appears well-developed and well-nourished. No distress.  HENT:  Head: Normocephalic and atraumatic.  Right Ear: External ear normal.  Left Ear: External ear normal.  Mouth/Throat: Oropharynx is clear and moist.  Eyes: Conjunctivae are normal.  Neck: Neck supple. No tracheal deviation present. No  thyromegaly present.  No carotid bruit  Cardiovascular: Normal rate, regular rhythm and normal heart sounds.   No murmur heard. Right anterior proximal distal leg with swollen, red, tender/palpable cord superficial vein  Pulmonary/Chest: Effort normal and breath sounds normal. No respiratory distress. She has no wheezes.  Abdominal: Soft. She exhibits no distension. There is no tenderness.  Musculoskeletal: She exhibits no edema (no ankle edema b/l).  Lymphadenopathy:    She has no cervical adenopathy.  Skin: Skin is warm and dry.  Psychiatric: She has a normal mood and affect. Her behavior is normal.          Assessment & Plan:   Flu vaccine today She will schedule a mammogram and eye appointment  See problem list for complete assessment and plan

## 2014-11-25 ENCOUNTER — Telehealth: Payer: Self-pay | Admitting: Internal Medicine

## 2014-11-25 MED ORDER — ATORVASTATIN CALCIUM 20 MG PO TABS: 20.0000 mg | ORAL_TABLET | Freq: Every day | ORAL | Status: DC

## 2014-11-25 NOTE — Telephone Encounter (Signed)
Please let her know I sent the cholesterol medication to her pharmacy.  It is lipitor (atorvastatin).  She should follow up with me in 3 months.

## 2014-12-08 ENCOUNTER — Emergency Department (HOSPITAL_COMMUNITY)
Admission: EM | Admit: 2014-12-08 | Discharge: 2014-12-09 | Disposition: A | Discharge status: Home / Self Care | Payer: Medicare Other | Attending: Emergency Medicine | Admitting: Emergency Medicine

## 2014-12-08 ENCOUNTER — Encounter (HOSPITAL_COMMUNITY): Payer: Self-pay | Admitting: Emergency Medicine

## 2014-12-08 DIAGNOSIS — I10 Essential (primary) hypertension: Secondary | ICD-10-CM | POA: Diagnosis not present

## 2014-12-08 DIAGNOSIS — J0100 Acute maxillary sinusitis, unspecified: Secondary | ICD-10-CM | POA: Diagnosis not present

## 2014-12-08 DIAGNOSIS — R202 Paresthesia of skin: Secondary | ICD-10-CM | POA: Insufficient documentation

## 2014-12-08 DIAGNOSIS — R51 Headache: Secondary | ICD-10-CM

## 2014-12-08 DIAGNOSIS — M542 Cervicalgia: Secondary | ICD-10-CM | POA: Insufficient documentation

## 2014-12-08 DIAGNOSIS — E119 Type 2 diabetes mellitus without complications: Secondary | ICD-10-CM | POA: Insufficient documentation

## 2014-12-08 DIAGNOSIS — R519 Headache, unspecified: Secondary | ICD-10-CM

## 2014-12-08 DIAGNOSIS — Z79899 Other long term (current) drug therapy: Secondary | ICD-10-CM | POA: Insufficient documentation

## 2014-12-08 DIAGNOSIS — Z791 Long term (current) use of non-steroidal anti-inflammatories (NSAID): Secondary | ICD-10-CM | POA: Insufficient documentation

## 2014-12-08 DIAGNOSIS — J329 Chronic sinusitis, unspecified: Secondary | ICD-10-CM | POA: Diagnosis not present

## 2014-12-08 DIAGNOSIS — I509 Heart failure, unspecified: Secondary | ICD-10-CM | POA: Insufficient documentation

## 2014-12-08 DIAGNOSIS — J320 Chronic maxillary sinusitis: Secondary | ICD-10-CM

## 2014-12-08 DIAGNOSIS — Z87891 Personal history of nicotine dependence: Secondary | ICD-10-CM | POA: Insufficient documentation

## 2014-12-08 LAB — BASIC METABOLIC PANEL
Anion gap: 10 (ref 5–15)
BUN: 8 mg/dL (ref 6–20)
CALCIUM: 9.6 mg/dL (ref 8.9–10.3)
CO2: 29 mmol/L (ref 22–32)
Chloride: 98 mmol/L — ABNORMAL LOW (ref 101–111)
Creatinine, Ser: 0.6 mg/dL (ref 0.44–1.00)
GFR calc Af Amer: >60 mL/min (ref >60)
Glucose, Bld: 98 mg/dL (ref 65–99)
POTASSIUM: 3.4 mmol/L — AB (ref 3.5–5.1)
SODIUM: 137 mmol/L (ref 135–145)

## 2014-12-08 LAB — CBC WITH DIFFERENTIAL/PLATELET
Basophils Absolute: 0 10*3/uL (ref 0.0–0.1)
Basophils Relative: 0 %
Eosinophils Absolute: 0.1 10*3/uL (ref 0.0–0.7)
Eosinophils Relative: 2 %
HCT: 38.6 % (ref 36.0–46.0)
Hemoglobin: 12.1 g/dL (ref 12.0–15.0)
Lymphocytes Relative: 47 %
Lymphs Abs: 3.2 10*3/uL (ref 0.7–4.0)
MCH: 27.9 pg (ref 26.0–34.0)
MCHC: 31.3 g/dL (ref 30.0–36.0)
MCV: 89.1 fL (ref 78.0–100.0)
Monocytes Absolute: 0.6 10*3/uL (ref 0.1–1.0)
Monocytes Relative: 9 %
Neutro Abs: 2.8 10*3/uL (ref 1.7–7.7)
Neutrophils Relative %: 42 %
Platelets: 364 10*3/uL (ref 150–400)
RBC: 4.33 MIL/uL (ref 3.87–5.11)
RDW: 13.3 % (ref 11.5–15.5)
WBC: 6.6 10*3/uL (ref 4.0–10.5)

## 2014-12-08 MED ORDER — KETOROLAC TROMETHAMINE 30 MG/ML IJ SOLN
30.0000 mg | Freq: Once | INTRAMUSCULAR | Status: AC
  Administered 2014-12-08: 30 mg via INTRAVENOUS
  Filled 2014-12-08: qty 1

## 2014-12-08 NOTE — ED Provider Notes (Signed)
CSN: [id number]     Arrival date & time 12/08/14  2019 History  By signing my name below, I, Freida Busman, attest that this documentation has been prepared under the direction and in the presence of Laurence Spates, MD . Electronically Signed: Freida Busman, Scribe. 12/09/2014. 12:37 AM.   Chief Complaint  Patient presents with  . Headache   The history is provided by the patient. No language interpreter was used.     HPI Comments:  Susan Brady is a 69 y.o. female who presents to the Emergency Department complaining of a gradual onset left sided HA that has progressively worsened since onset ~ 2 days ago. She notes her pain has been constant but waxes and wanes in severity. Her pain starts on the left side of her neck and radiates up to the top of her head along the left side.  She describes her pain as sharp when she moves her neck; states it feels as if she pulled a muscle. She reports occasional photophobia and intermittent mild left hand tingling. Pt denies weakness, abdominal pain, nausea, vomiting, recent head injury, dizziness or vertigo. No sudden onset of severe headache. She also denies h/o frequent HA/migraines. She has taken tylenol without relief.  Pt has been compliant with current medications.    Past Medical History  Diagnosis Date  . Hypertension   . CHF (congestive heart failure) (HCC)   . Diabetes mellitus without complication (HCC)    History reviewed. No pertinent past surgical history. Family History  Problem Relation Age of Onset  . Heart disease Mother   . Alcohol abuse Father   . Alcohol abuse Sister   . Diabetes Sister   . Hypertension Sister   . Cancer Sister   . Alcohol abuse Brother   . Diabetes Brother   . Hypertension Brother    Social History  Substance Use Topics  . Smoking status: Former Games developer  . Smokeless tobacco: None  . Alcohol Use: No   OB History    No data available     Review of Systems  10 systems reviewed and all are  negative for acute change except as noted in the HPI.  Allergies  Lisinopril  Home Medications   Prior to Admission medications   Medication Sig Start Date End Date Taking? Authorizing Provider  atorvastatin (LIPITOR) 20 MG tablet Take 1 tablet (20 mg total) by mouth daily. 11/25/14  Yes Pincus Sanes, MD  cholecalciferol (VITAMIN D) 1000 UNITS tablet Take 1,000 Units by mouth once a week.    Yes Historical Provider, MD  diclofenac (VOLTAREN) 75 MG EC tablet TAKE 1 TABLET BY MOUTH TWICE DAILY 04/05/14  Yes Max T Hyatt, DPM  furosemide (LASIX) 20 MG tablet Take 20 mg by mouth 2 (two) times daily.   Yes Historical Provider, MD  Garlic 1000 MG CAPS Take 1,000 mg by mouth daily.   Yes Historical Provider, MD  hydrALAZINE (APRESOLINE) 50 MG tablet Take 75 mg by mouth 3 (three) times daily.   Yes Historical Provider, MD  hydrochlorothiazide (HYDRODIURIL) 25 MG tablet Take 25 mg by mouth daily.   Yes Historical Provider, MD  HYDROcodone-acetaminophen (NORCO/VICODIN) 5-325 MG per tablet Take 1 tablet by mouth every 6 (six) hours as needed for pain.   Yes Historical Provider, MD  metFORMIN (GLUCOPHAGE) 500 MG tablet Take 500 mg by mouth daily.   Yes Historical Provider, MD  metoprolol (LOPRESSOR) 50 MG tablet Take 50 mg by mouth 2 (two) times daily.  12/31/13  Yes Historical Provider, MD  nystatin (MYCOSTATIN/NYSTOP) 100000 UNIT/GM POWD APPLY TO ABDOMEN FOLDS AND UNDER BREAST 2-3 TIMES DAILY 09/14/14  Yes Historical Provider, MD  omega-3 acid ethyl esters (LOVAZA) 1 G capsule Take 2 g by mouth 2 (two) times daily.   Yes Historical Provider, MD  vitamin E 400 UNIT capsule Take 400 Units by mouth daily.   Yes Historical Provider, MD  ACCU-CHEK AVIVA PLUS test strip TEST QD 09/18/14   Historical Provider, MD   BP 126/61 mmHg  Pulse 50  Temp(Src) 97.9 F (36.6 C) (Oral)  Resp 16  Ht $R'5\' 11"'Jv$  (1.803 m)  Wt 214 lb (97.07 kg)  BMI 29.86 kg/m2  SpO2 98% Physical Exam  Constitutional: She is oriented to  person, place, and time. She appears well-developed and well-nourished. No distress.  HENT:  Head: Normocephalic and atraumatic.  Mouth/Throat: Oropharynx is clear and moist. No oropharyngeal exudate.  Moist mucous membranes  Eyes: Conjunctivae and EOM are normal. Pupils are equal, round, and reactive to light.  Neck: Normal range of motion. Neck supple.  Left paraspinal muscle tenderness at base of neck   Cardiovascular: Normal rate, regular rhythm and normal heart sounds.   No murmur heard. Pulmonary/Chest: Effort normal and breath sounds normal.  Abdominal: Soft. Bowel sounds are normal. She exhibits no distension. There is no tenderness.  Musculoskeletal: She exhibits no edema.  Neurological: She is alert and oriented to person, place, and time. She displays normal reflexes. No cranial nerve deficit. She exhibits normal muscle tone.  Fluent speech 5/5 strength and normal sensation throughout negative protonator drift Normal finger to nose test  Skin: Skin is warm and dry.  Psychiatric: She has a normal mood and affect. Judgment normal.  Nursing note and vitals reviewed.   ED Course  Procedures   DIAGNOSTIC STUDIES:  Oxygen Saturation is 98% on RA, normal by my interpretation.    COORDINATION OF CARE:  11:40 PM Pt updated with partial results.  Will order toradol, and labs.  Discussed treatment plan with pt at bedside and pt agreed to plan.  Labs Review Labs Reviewed  BASIC METABOLIC PANEL - Abnormal; Notable for the following:    Potassium 3.4 (*)    Chloride 98 (*)    All other components within normal limits  CBC WITH DIFFERENTIAL/PLATELET  SEDIMENTATION RATE    Imaging Review Ct Head Wo Contrast  12/09/2014  CLINICAL DATA:  Left-sided headache for 2 days. EXAM: CT HEAD WITHOUT CONTRAST TECHNIQUE: Contiguous axial images were obtained from the base of the skull through the vertex without intravenous contrast. COMPARISON:  11/26/2012 FINDINGS: No intracranial  hemorrhage, mass effect, or midline shift. No hydrocephalus. The basilar cisterns are patent. No evidence of territorial infarct. No intracranial fluid collection. Calvarium is intact. Mild chronic mucosal thickening of left maxillary sinus, remaining paranasal sinuses are well-aerated. The mastoid air cells are well aerated. IMPRESSION: 1.  No acute intracranial abnormality. 2. Mild chronic left maxillary sinus disease. Electronically Signed   By: Jeb Levering M.D.   On: 12/09/2014 00:37   I have personally reviewed and evaluated these lab results as part of my medical decision-making.   EKG Interpretation None      MDM   Final diagnoses:  Nonintractable headache, unspecified chronicity pattern, unspecified headache type  Maxillary sinusitis, unspecified chronicity   69yo  F who p/w 2 days of intermittent, gradual onset of headache starting on L side of neck and radiating up left side of head. Pt well  appearing w/ normal VS at presentation. No neurologic deficits on exam. Obtained basic labs including ESR to r/o temporal arteritis. Labs unremarkable. No visual changes to suggest ophthalmologic problem. No vertigo sx to suggest vertebral artery dissection. No sudden onset to suggest [place]. Obtained CT head which was negative for intracranial process but showed left maxillary sinus disease. Given left-sided sx, gave pt augmentin to treat sinusitis as it may be contributing to sx. After receiving toradol, pt's headache improved. She may have some left paraspinal cervical muscle strain contributing to sx. Reviewed return precautions including sudden onset pain, vertigo, fever, or upper extremity weakness/numbness. Pt voiced understanding and discharged in satisfactory condition.  I personally performed the services described in this documentation, which was scribed in my presence. The recorded information has been reviewed and is accurate.    Sharlett Iles, MD 12/09/14 [phone]

## 2014-12-08 NOTE — ED Notes (Signed)
Pt states headache started the day before yesterday shooting up back of head and painful behind her eyes also causing neck pain and stiffness.

## 2014-12-08 NOTE — ED Notes (Signed)
Pt. reports headache onset 2 days ago , denies head injury , no nausea or vomitting , denies fever , pain radiating to left neck and left shoulder .

## 2014-12-09 ENCOUNTER — Emergency Department (HOSPITAL_COMMUNITY): Payer: Medicare Other

## 2014-12-09 DIAGNOSIS — R51 Headache: Secondary | ICD-10-CM | POA: Diagnosis not present

## 2014-12-09 DIAGNOSIS — J329 Chronic sinusitis, unspecified: Secondary | ICD-10-CM | POA: Diagnosis not present

## 2014-12-09 LAB — SEDIMENTATION RATE: SED RATE: 9 mm/h (ref 0–22)

## 2014-12-09 MED ORDER — AMOXICILLIN-POT CLAVULANATE 875-125 MG PO TABS: 1.0000 | ORAL_TABLET | Freq: Two times a day (BID) | ORAL | Status: DC

## 2014-12-09 NOTE — Discharge Instructions (Signed)

## 2014-12-09 NOTE — ED Notes (Signed)
EDP at bedside  

## 2014-12-13 ENCOUNTER — Ambulatory Visit (INDEPENDENT_AMBULATORY_CARE_PROVIDER_SITE_OTHER): Payer: Medicare Other | Admitting: Internal Medicine

## 2014-12-13 ENCOUNTER — Encounter: Payer: Self-pay | Admitting: Internal Medicine

## 2014-12-13 VITALS — BP 112/58 | HR 63 | Temp 98.4°F | Resp 16 | Wt 213.0 lb

## 2014-12-13 DIAGNOSIS — M542 Cervicalgia: Secondary | ICD-10-CM

## 2014-12-13 NOTE — Progress Notes (Signed)
Pre visit review using our clinic review tool, if applicable. No additional management support is needed unless otherwise documented below in the visit note. 

## 2014-12-13 NOTE — Patient Instructions (Addendum)
Your neck pain is muscular in nature.   Continue taking the tylenol for your neck pain. Apply heat and try an over the counter muscle ache rub, such as bengay.  If no improvement we can consider physical therapy or a mild muscle relaxant.   Cal with any questions or concerns.

## 2014-12-13 NOTE — Progress Notes (Signed)
Subjective:    Patient ID: Susan Brady, female    DOB: [birth year], 69 y.o.   MRN: [id number]  HPI She is here for follow up from the ED.  She went to the ED 10/20 for a headache.  The headache had a gradual onset and was located on the left posterior neck and radiated upward.  The pain was sharp when she moved her neck and she thought it felt like a pulled muscle.  It worsened over the two days prior to her going to the ED.  The pain was constant,but the severity of pain varied in intensity.  She told the ED she had occasional photophobia and intermittent left hand tingling.  She tried tylenol without relief.   She denied weakness, dizziness/vertigo, nausea/vomiting and recent head injuries. Neurologically she was intact in the ED.  Labs were unremarkable.  A CT of the head was unremarkalble except for possible sinus disease.  She was prescribed augmentin.  She received toradol in the EDand her headache improved.   She is stll having neck pain when she turns her head to the right, but denies pain when still. The pain shoots up her back of head. She has pain in her posterior neck, upper back radiating to her shoulders.  She denies a genrealized headache. The posterior neck feels stiff.  Tylenol and heat have not helped.  Has not had simlar symptoms in the past. There has been some improvement, but she still has the symptoms.   Medications and allergies reviewed with patient and updated if appropriate.  Patient Active Problem List   Diagnosis Date Noted  . Essential hypertension 11/22/2014  . Diabetes mellitus (HCC) 11/22/2014  . Arthritis 11/22/2014  . Glaucoma 11/22/2014  . GERD (gastroesophageal reflux disease) 11/22/2014  . Varicose veins 11/22/2014  . Heart disease 11/22/2014  . Myocardial infarct, old 11/22/2014  . Superficial thrombophlebitis of lower extremity 11/22/2014    Past Medical History  Diagnosis Date  . Hypertension   . CHF (congestive heart failure) (HCC)   .  Diabetes mellitus without complication (HCC)     No past surgical history on file.  Social History   Social History  . Marital Status: Divorced    Spouse Name: N/A  . Number of Children: N/A  . Years of Education: N/A   Social History Main Topics  . Smoking status: Former Games developer  . Smokeless tobacco: None  . Alcohol Use: No  . Drug Use: No  . Sexual Activity: Not Asked   Other Topics Concern  . None   Social History Narrative    Review of Systems  Constitutional: Negative for fever and chills.  HENT: Negative for congestion and sinus pressure.   Musculoskeletal: Positive for neck pain and neck stiffness.  Neurological: Positive for headaches. Negative for dizziness, weakness, light-headedness and numbness.       Objective:   Filed Vitals:   12/13/14 1433  BP: 112/58  Pulse: 63  Temp: 98.4 F (36.9 C)  Resp: 16   Filed Weights   12/13/14 1433  Weight: 213 lb (96.616 kg)   Body mass index is 29.72 kg/(m^2).   Physical Exam  Constitutional: She is oriented to person, place, and time. She appears well-developed and well-nourished. No distress.  Musculoskeletal: She exhibits no edema.  FROM of neck but pain with movement, muscular tenderness with palpation of b/l trapezius muscles  Neurological: She is alert and oriented to person, place, and time. She exhibits normal muscle tone. Coordination normal.  Normal strength and sensation upper extremities        Assessment & Plan:   Muscular neck pain/trapezius strain, nerve pain in back of head related to muscle strain Reassured her this pain is muscular in nature Continue tylenol and heat Gentle stretching  Already taking diclofenac Used otc topical arthritic med such as bengay  If no improvement she will call - can consider PT or mild muscle relaxer

## 2014-12-27 ENCOUNTER — Telehealth: Payer: Self-pay | Admitting: *Deleted

## 2014-12-27 MED ORDER — HYDROCODONE-ACETAMINOPHEN 5-325 MG PO TABS: 1.0000 | ORAL_TABLET | Freq: Four times a day (QID) | ORAL | Status: DC | PRN

## 2014-12-27 NOTE — Telephone Encounter (Signed)
printed

## 2014-12-27 NOTE — Telephone Encounter (Signed)
Left msg on triage requesting refill on pain medication hydrocodone...Raechel Chute/lmb

## 2014-12-28 NOTE — Telephone Encounter (Signed)
Notified caller [facility]) rx ready for pick-up...Raechel Chute/lmb

## 2015-01-02 ENCOUNTER — Telehealth: Payer: Self-pay | Admitting: Internal Medicine

## 2015-01-02 MED ORDER — HYDRALAZINE HCL 50 MG PO TABS: 75.0000 mg | ORAL_TABLET | Freq: Three times a day (TID) | ORAL | Status: DC

## 2015-01-02 NOTE — Telephone Encounter (Signed)
Notified [facility] refill sent to [facility]...Raechel Chute/lmb

## 2015-01-02 NOTE — Telephone Encounter (Signed)
Verified pharmacy is [facility] on file. requesting a fill of hydrALAZINE (APRESOLINE) 50 MG tablet [[id number]][[id number]] .

## 2015-01-27 ENCOUNTER — Telehealth: Payer: Self-pay | Admitting: Internal Medicine

## 2015-01-27 NOTE — Telephone Encounter (Signed)
Pt request refill for hydrocodone.  °

## 2015-01-27 NOTE — Telephone Encounter (Signed)
Will have to wait until Monday.  I would also like her to f/u with me to discuss other pain management options.

## 2015-01-30 MED ORDER — HYDROCODONE-ACETAMINOPHEN 5-325 MG PO TABS: 1.0000 | ORAL_TABLET | Freq: Four times a day (QID) | ORAL | Status: DC | PRN

## 2015-01-30 NOTE — Telephone Encounter (Signed)
Spoke with pts daughter to inform RX was ready for pick up at front office.

## 2015-03-24 ENCOUNTER — Other Ambulatory Visit (INDEPENDENT_AMBULATORY_CARE_PROVIDER_SITE_OTHER): Payer: Medicare Other

## 2015-03-24 ENCOUNTER — Ambulatory Visit (INDEPENDENT_AMBULATORY_CARE_PROVIDER_SITE_OTHER): Payer: Medicare Other | Admitting: Internal Medicine

## 2015-03-24 ENCOUNTER — Encounter: Payer: Self-pay | Admitting: Internal Medicine

## 2015-03-24 VITALS — BP 118/58 | HR 56 | Temp 98.4°F | Resp 16 | Wt 211.0 lb

## 2015-03-24 DIAGNOSIS — I10 Essential (primary) hypertension: Secondary | ICD-10-CM | POA: Diagnosis not present

## 2015-03-24 DIAGNOSIS — E119 Type 2 diabetes mellitus without complications: Secondary | ICD-10-CM | POA: Diagnosis not present

## 2015-03-24 DIAGNOSIS — M1990 Unspecified osteoarthritis, unspecified site: Secondary | ICD-10-CM

## 2015-03-24 DIAGNOSIS — Z139 Encounter for screening, unspecified: Secondary | ICD-10-CM

## 2015-03-24 DIAGNOSIS — I519 Heart disease, unspecified: Secondary | ICD-10-CM

## 2015-03-24 DIAGNOSIS — Z23 Encounter for immunization: Secondary | ICD-10-CM | POA: Diagnosis not present

## 2015-03-24 LAB — COMPREHENSIVE METABOLIC PANEL
ALK PHOS: 48 U/L (ref 39–117)
ALT: 12 U/L (ref 0–35)
AST: 17 U/L (ref 0–37)
Albumin: 4.5 g/dL (ref 3.5–5.2)
BILIRUBIN TOTAL: 0.6 mg/dL (ref 0.2–1.2)
BUN: 17 mg/dL (ref 6–23)
CALCIUM: 9.9 mg/dL (ref 8.4–10.5)
CO2: 32 mEq/L (ref 19–32)
Chloride: 104 mEq/L (ref 96–112)
Creatinine, Ser: 0.6 mg/dL (ref 0.40–1.20)
GFR: 126.94 mL/min (ref >60.00)
GLUCOSE: 116 mg/dL — AB (ref 70–99)
POTASSIUM: 4 meq/L (ref 3.5–5.1)
Sodium: 143 mEq/L (ref 135–145)
TOTAL PROTEIN: 7.5 g/dL (ref 6.0–8.3)

## 2015-03-24 LAB — LIPID PANEL
CHOL/HDL RATIO: 2
Cholesterol: 130 mg/dL (ref 0–200)
HDL: 60.6 mg/dL (ref >39.00)
LDL CALC: 56 mg/dL (ref 0–99)
NONHDL: 69.56
Triglycerides: 69 mg/dL (ref 0.0–149.0)
VLDL: 13.8 mg/dL (ref 0.0–40.0)

## 2015-03-24 LAB — HEMOGLOBIN A1C: Hgb A1c MFr Bld: 6 % (ref 4.6–6.5)

## 2015-03-24 MED ORDER — HYDROCHLOROTHIAZIDE 25 MG PO TABS
25.0000 mg | ORAL_TABLET | Freq: Every day | ORAL | Status: DC
Start: 2015-03-24 — End: 2015-06-22

## 2015-03-24 MED ORDER — NYSTATIN 100000 UNIT/GM EX POWD
CUTANEOUS | Status: DC
Start: 2015-03-24 — End: 2016-04-05

## 2015-03-24 MED ORDER — DICLOFENAC SODIUM 75 MG PO TBEC: 75.0000 mg | DELAYED_RELEASE_TABLET | Freq: Two times a day (BID) | ORAL | Status: DC

## 2015-03-24 MED ORDER — METFORMIN HCL 500 MG PO TABS: 500.0000 mg | ORAL_TABLET | Freq: Every day | ORAL | Status: DC

## 2015-03-24 MED ORDER — METOPROLOL TARTRATE 50 MG PO TABS
50.0000 mg | ORAL_TABLET | Freq: Two times a day (BID) | ORAL | Status: DC
Start: 2015-03-24 — End: 2016-05-27

## 2015-03-24 MED ORDER — HYDROCODONE-ACETAMINOPHEN 5-325 MG PO TABS: 1.0000 | ORAL_TABLET | Freq: Four times a day (QID) | ORAL | Status: DC | PRN

## 2015-03-24 MED ORDER — ATORVASTATIN CALCIUM 20 MG PO TABS: 20.0000 mg | ORAL_TABLET | Freq: Every day | ORAL | Status: DC

## 2015-03-24 MED ORDER — ASPIRIN EC 81 MG PO TBEC: 81.0000 mg | DELAYED_RELEASE_TABLET | Freq: Every day | ORAL | Status: DC

## 2015-03-24 MED ORDER — FUROSEMIDE 20 MG PO TABS: 20.0000 mg | ORAL_TABLET | Freq: Two times a day (BID) | ORAL | Status: DC

## 2015-03-24 MED ORDER — HYDRALAZINE HCL 50 MG PO TABS: 75.0000 mg | ORAL_TABLET | Freq: Three times a day (TID) | ORAL | Status: DC

## 2015-03-24 NOTE — Assessment & Plan Note (Signed)
Generalized osteoarthritis Taking diclofenac twice daily Exercising, although minimally Takes hydrocodone approximately once a day for pain

## 2015-03-24 NOTE — Progress Notes (Signed)
Subjective:    Patient ID: Susan Brady, female    DOB: 05-13-1945, 70 y.o.   MRN: [id number]  HPI She is here for follow up.   She has a dry mouth.  It bothers her most at night.  It has been there for a while but is worse.  She drinks a lot of water.  She denies a sore throat or difficulty swallowing.  She denies snoring or choking or gasping for air at night.    Diabetes: She is taking her medication daily as prescribed. She is not compliant with a diabetic diet. She is exercising regularly - walks the dog. She monitors her sugars and they have been running normal.   Hypertension: She is taking her medication daily. She is compliant with a low sodium diet.  She denies chest pain, palpitations, edema, shortness of breath and regular headaches. She is exercising regularly - walking dog.  She does not monitor her blood pressure at home.    CAD, s/p MI:  She is taking all of her medications as prescribed.  We started her on a statin, lipitor, a few months ago because she was not one one.  She is Tolerating the medication well and denies side effects. She is currently not taking an aspirin daily.  Osteoarthritis: She has fairly diffuse osteoarthritis. She takes diclofenac twice daily. She uses the hydrocodone only as needed and at most takes it once a day.    Medications and allergies reviewed with patient and updated if appropriate.  Patient Active Problem List   Diagnosis Date Noted  . Essential hypertension 11/22/2014  . Diabetes mellitus (HCC) 11/22/2014  . Arthritis 11/22/2014  . Glaucoma 11/22/2014  . GERD (gastroesophageal reflux disease) 11/22/2014  . Varicose veins 11/22/2014  . Heart disease 11/22/2014  . Myocardial infarct, old 11/22/2014  . Superficial thrombophlebitis of lower extremity 11/22/2014    Current Outpatient Prescriptions on File Prior to Visit  Medication Sig Dispense Refill  . ACCU-CHEK AVIVA PLUS test strip TEST QD  5  . atorvastatin (LIPITOR) 20 MG  tablet Take 1 tablet (20 mg total) by mouth daily. 90 tablet 1  . cholecalciferol (VITAMIN D) 1000 UNITS tablet Take 1,000 Units by mouth once a week.     . diclofenac (VOLTAREN) 75 MG EC tablet TAKE 1 TABLET BY MOUTH TWICE DAILY 60 tablet 0  . furosemide (LASIX) 20 MG tablet Take 20 mg by mouth 2 (two) times daily.    . Garlic 1000 MG CAPS Take 1,000 mg by mouth daily.    . hydrALAZINE (APRESOLINE) 50 MG tablet Take 1.5 tablets (75 mg total) by mouth 3 (three) times daily. 45 tablet 3  . hydrochlorothiazide (HYDRODIURIL) 25 MG tablet Take 25 mg by mouth daily.    Marland Kitchen HYDROcodone-acetaminophen (NORCO/VICODIN) 5-325 MG tablet Take 1 tablet by mouth every 6 (six) hours as needed. 30 tablet 0  . metFORMIN (GLUCOPHAGE) 500 MG tablet Take 500 mg by mouth daily.    . metoprolol (LOPRESSOR) 50 MG tablet Take 50 mg by mouth 2 (two) times daily.   5  . nystatin (MYCOSTATIN/NYSTOP) 100000 UNIT/GM POWD APPLY TO ABDOMEN FOLDS AND UNDER BREAST 2-3 TIMES DAILY  5  . omega-3 acid ethyl esters (LOVAZA) 1 G capsule Take 2 g by mouth 2 (two) times daily.    . vitamin E 400 UNIT capsule Take 400 Units by mouth daily.     No current facility-administered medications on file prior to visit.    Past Medical  History  Diagnosis Date  . Hypertension   . CHF (congestive heart failure) (HCC)   . Diabetes mellitus without complication (HCC)     No past surgical history on file.  Social History   Social History  . Marital Status: Divorced    Spouse Name: N/A  . Number of Children: N/A  . Years of Education: N/A   Social History Main Topics  . Smoking status: Former Games developer  . Smokeless tobacco: Not on file  . Alcohol Use: No  . Drug Use: No  . Sexual Activity: Not on file   Other Topics Concern  . Not on file   Social History Narrative    Family History  Problem Relation Age of Onset  . Heart disease Mother   . Alcohol abuse Father   . Alcohol abuse Sister   . Diabetes Sister   . Hypertension  Sister   . Cancer Sister   . Alcohol abuse Brother   . Diabetes Brother   . Hypertension Brother     Review of Systems  Constitutional: Negative for fever.  Respiratory: Positive for cough. Negative for shortness of breath and wheezing.   Cardiovascular: Positive for palpitations (occasional). Negative for chest pain and leg swelling.  Musculoskeletal: Negative for myalgias.  Neurological: Positive for headaches (occasional). Negative for dizziness and light-headedness.       Objective:   Filed Vitals:   03/24/15 0901  BP: 118/58  Pulse: 56  Temp: 98.4 F (36.9 C)  Resp: 16   Filed Weights   03/24/15 0901  Weight: 211 lb (95.709 kg)   Body mass index is 29.44 kg/(m^2).   Physical Exam Constitutional: Appears well-developed and well-nourished. No distress.  Neck: Neck supple. No tracheal deviation present. No thyromegaly present.  No carotid bruit. No cervical adenopathy.   Cardiovascular: Normal rate, regular rhythm and normal heart sounds.   1/6 systolic murmur 6 .  No edema Pulmonary/Chest: Effort normal and breath sounds normal. No respiratory distress. No wheezes.        Assessment & Plan:    See Problem List for Assessment and Plan of chronic medical problems.   Follow-up in 6 months, sooner if needed

## 2015-03-24 NOTE — Assessment & Plan Note (Signed)
Blood pressure well-controlled Check CMP Continue current medication

## 2015-03-24 NOTE — Assessment & Plan Note (Signed)
Diabetes has been controlled Check A1c today Will have an eye exam-she is due Continue metformin Continue regular exercise

## 2015-03-24 NOTE — Assessment & Plan Note (Signed)
History of heart attack Lipitor started and she is tolerating it well Recheck lipid panel, CMP Currently not taking an aspirin daily-start 81 mg daily Discussed risk of GI bleeds-she is also on diclofenac Discussed increased risk of bruising Continue regular exercise

## 2015-03-24 NOTE — Progress Notes (Signed)
Pre visit review using our clinic review tool, if applicable. No additional management support is needed unless otherwise documented below in the visit note. 

## 2015-03-24 NOTE — Patient Instructions (Signed)
  We have reviewed your prior records including labs and tests today.  Test(s) ordered today. Your results will be released to MyChart (or called to you) after review, usually within 72hours after test completion. If any changes need to be made, you will be notified at that same time.  All other Health Maintenance issues reviewed.   All recommended immunizations and age-appropriate screenings are up-to-date.  prenvar vaccine administered today.   Medications reviewed and updated,.  Changes include starting aspirin 81 mg daily.    Please schedule followup in 6 months

## 2015-03-25 LAB — HEPATITIS C ANTIBODY: HCV Ab: NEGATIVE

## 2015-05-02 ENCOUNTER — Telehealth: Payer: Self-pay | Admitting: *Deleted

## 2015-05-02 MED ORDER — HYDROCODONE-ACETAMINOPHEN 5-325 MG PO TABS: 1.0000 | ORAL_TABLET | Freq: Four times a day (QID) | ORAL | Status: DC | PRN

## 2015-05-02 NOTE — Telephone Encounter (Signed)
rx printed

## 2015-05-02 NOTE — Telephone Encounter (Signed)
Requesting refill on hydrocodone.../lmb 

## 2015-05-02 NOTE — Telephone Encounter (Signed)
Notified [facility] ready for pick-up...Susan Brady/lmb

## 2015-05-16 ENCOUNTER — Telehealth: Payer: Self-pay | Admitting: Internal Medicine

## 2015-05-16 NOTE — Telephone Encounter (Signed)
[facility] - Client TELEPHONE ADVICE RECORD [facility] Patient Name: Susan Brady DOB: 1945/05/18 Initial Comment Caller states mother is having pain in left shoulder and feels pins and needles tingling up and down left arm. Nurse Assessment Nurse: Izola PriceMyers, RN, Cala BradfordKimberly Date/Time Lamount Cohen(Eastern Time): 05/16/2015 3:07:41 PM Confirm and document reason for call. If symptomatic, describe symptoms. You must click the next button to save text entered. ---Caller states mother is having pain in left shoulder and feels pins and needles tingling up and down left arm. Has tried heating pad and cold pack and this helps somewhat but not a lot. Has been going off and on for 2 weeks. Lying down makes it worse. Did have MI years ago. Denies chest pain, SOB, N/V or sweating. Pt states she has "indigestion" that is new. Takes hctz, lasix, metformin, multiple vitamins, hydrocodone, allergy medication, hydralazine Has the patient traveled out of the country within the last 30 days? ---Not Applicable Does the patient have any new or worsening symptoms? ---Yes Will a triage be completed? ---Yes Related visit to physician within the last 2 weeks? ---No Does the PT have any chronic conditions? (i.e. diabetes, asthma, etc.) ---Yes List chronic conditions. ---htn, diabetes, allergies, chronic pain Is this a behavioral health or substance abuse call? ---No Guidelines Guideline Title Affirmed Question Affirmed Notes Shoulder Pain [1] Age > 40 AND [2] no obvious cause AND [3] pain even when not moving the arm (Exception: pain is clearly made worse by moving arm or bending neck) Final Disposition User Go to ED Now Izola PriceMyers, RN, Cala BradfordKimberly

## 2015-05-22 ENCOUNTER — Ambulatory Visit (INDEPENDENT_AMBULATORY_CARE_PROVIDER_SITE_OTHER): Payer: Medicare Other | Admitting: Internal Medicine

## 2015-05-22 ENCOUNTER — Encounter: Payer: Self-pay | Admitting: Internal Medicine

## 2015-05-22 VITALS — BP 124/70 | HR 54 | Temp 98.2°F | Resp 18 | Wt 216.0 lb

## 2015-05-22 DIAGNOSIS — M25512 Pain in left shoulder: Secondary | ICD-10-CM

## 2015-05-22 NOTE — Patient Instructions (Addendum)
Ice your shoulder and continue your current medications.  You can apply anything topically if you want, such as ben-gay or icy hot.    If you want a referral for physical therapy let me know, if not Dr. Katrinka BlazingSmith will advise you on treatment.    See Dr. Katrinka BlazingSmith for further evaluation of your shoulder.

## 2015-05-22 NOTE — Progress Notes (Signed)
Subjective:    Patient ID: Susan Brady, female    DOB: 14-Nov-1945, 70 y.o.   MRN: [id number]  HPI She is here for an acute visit for shoulder pain.   Left Shoulder pain:  Her left shoulder started hurting approximately one month ago.  There was no accident or activity that caused the pain-it seemed to just start.  The pain feels like it is on the inside of the shoulder.  More recently the pain is radiating up toward her neck and she has felt tingling in her left arm. Her left hand has felt slightly weak.  The pain is worse with movement. She does note more pain with reaching behind her head. She tried taking otc meds and tried topical medicaiton - nothing helped much.  She already takes diclofenac and hydrocodone on a daily basis. She has not tried icing the shoulder.  She is able to do everything she needs to do during the day, but the pain bothers her more at night if she sleeps on that side.  Medications and allergies reviewed with patient and updated if appropriate.  Patient Active Problem List   Diagnosis Date Noted  . Essential hypertension 11/22/2014  . Diabetes mellitus (HCC) 11/22/2014  . Arthritis 11/22/2014  . Glaucoma 11/22/2014  . GERD (gastroesophageal reflux disease) 11/22/2014  . Varicose veins 11/22/2014  . Heart disease 11/22/2014  . Myocardial infarct, old 11/22/2014  . Superficial thrombophlebitis of lower extremity 11/22/2014    Current Outpatient Prescriptions on File Prior to Visit  Medication Sig Dispense Refill  . ACCU-CHEK AVIVA PLUS test strip TEST QD  5  . aspirin EC 81 MG tablet Take 1 tablet (81 mg total) by mouth daily.    Marland Kitchen atorvastatin (LIPITOR) 20 MG tablet Take 1 tablet (20 mg total) by mouth daily. 90 tablet 1  . cholecalciferol (VITAMIN D) 1000 UNITS tablet Take 1,000 Units by mouth once a week.     . diclofenac (VOLTAREN) 75 MG EC tablet Take 1 tablet (75 mg total) by mouth 2 (two) times daily. 180 tablet 1  . furosemide (LASIX) 20 MG tablet  Take 1 tablet (20 mg total) by mouth 2 (two) times daily. 180 tablet 1  . Garlic 1000 MG CAPS Take 1,000 mg by mouth daily.    . hydrALAZINE (APRESOLINE) 50 MG tablet Take 1.5 tablets (75 mg total) by mouth 3 (three) times daily. 405 tablet 3  . hydrochlorothiazide (HYDRODIURIL) 25 MG tablet Take 1 tablet (25 mg total) by mouth daily. 90 tablet 1  . HYDROcodone-acetaminophen (NORCO/VICODIN) 5-325 MG tablet Take 1 tablet by mouth every 6 (six) hours as needed. 30 tablet 0  . metFORMIN (GLUCOPHAGE) 500 MG tablet Take 1 tablet (500 mg total) by mouth daily. 90 tablet 1  . metoprolol (LOPRESSOR) 50 MG tablet Take 1 tablet (50 mg total) by mouth 2 (two) times daily. 180 tablet 3  . nystatin (MYCOSTATIN/NYSTOP) 100000 UNIT/GM POWD APPLY TO ABDOMEN FOLDS AND UNDER BREAST 2-3 TIMES DAILY 60 g 5  . omega-3 acid ethyl esters (LOVAZA) 1 G capsule Take 2 g by mouth 2 (two) times daily.    . vitamin E 400 UNIT capsule Take 400 Units by mouth daily.     No current facility-administered medications on file prior to visit.    Past Medical History  Diagnosis Date  . Hypertension   . CHF (congestive heart failure) (HCC)   . Diabetes mellitus without complication (HCC)     No past surgical history  on file.  Social History   Social History  . Marital Status: Divorced    Spouse Name: N/A  . Number of Children: N/A  . Years of Education: N/A   Social History Main Topics  . Smoking status: Former Games developermoker  . Smokeless tobacco: Not on file  . Alcohol Use: No  . Drug Use: No  . Sexual Activity: Not on file   Other Topics Concern  . Not on file   Social History Narrative    Family History  Problem Relation Age of Onset  . Heart disease Mother   . Alcohol abuse Father   . Alcohol abuse Sister   . Diabetes Sister   . Hypertension Sister   . Cancer Sister   . Alcohol abuse Brother   . Diabetes Brother   . Hypertension Brother     Review of Systems See history of present illness      Objective:   Filed Vitals:   05/22/15 1107  BP: 124/70  Pulse: 54  Temp: 98.2 F (36.8 C)  Resp: 18   Filed Weights   05/22/15 1107  Weight: 216 lb (97.977 kg)   Body mass index is 30.14 kg/(m^2).   Physical Exam A left Shoulder exam was performed.   SWELLING: none  EFFUSION: no  WARMTH: no warmth  TENDERNESS: Mild tenderness throughout shoulder and upper arm with palpation  ROM: full ROM with pain NEUROLOGICAL EXAM: normal sensation and strength  PULSES: normal        Assessment & Plan:   Left shoulder pain No obvious accident or injury Possible bursitis or other tendinopathy Advised her to ice the shoulder Continue diclofenac and hydrocodone that she already takes Deferred physical therapy We will have her see Dr. Katrinka BlazingSmith for further evaluation and treatment

## 2015-05-22 NOTE — Progress Notes (Signed)
Pre visit review using our clinic review tool, if applicable. No additional management support is needed unless otherwise documented below in the visit note. 

## 2015-05-26 ENCOUNTER — Other Ambulatory Visit: Payer: Self-pay | Admitting: Internal Medicine

## 2015-06-08 ENCOUNTER — Ambulatory Visit: Payer: Medicare Other | Admitting: Family Medicine

## 2015-06-22 ENCOUNTER — Ambulatory Visit (INDEPENDENT_AMBULATORY_CARE_PROVIDER_SITE_OTHER): Payer: Medicare Other | Admitting: Internal Medicine

## 2015-06-22 ENCOUNTER — Encounter: Payer: Self-pay | Admitting: Internal Medicine

## 2015-06-22 VITALS — BP 114/64 | HR 46 | Temp 98.7°F | Resp 16 | Wt 215.0 lb

## 2015-06-22 DIAGNOSIS — M25512 Pain in left shoulder: Secondary | ICD-10-CM | POA: Diagnosis not present

## 2015-06-22 MED ORDER — HYDROCODONE-ACETAMINOPHEN 7.5-325 MG PO TABS: 1.0000 | ORAL_TABLET | Freq: Three times a day (TID) | ORAL | Status: DC | PRN

## 2015-06-22 MED ORDER — HYDROCHLOROTHIAZIDE 25 MG PO TABS: 25.0000 mg | ORAL_TABLET | Freq: Every day | ORAL | Status: DC

## 2015-06-22 MED ORDER — METFORMIN HCL 500 MG PO TABS: 500.0000 mg | ORAL_TABLET | Freq: Every day | ORAL | Status: DC

## 2015-06-22 NOTE — Patient Instructions (Signed)
You will see Dr Katrinka BlazingSmith for further evaluation and treatment of the shoulder.    Continue the diclofenac twice daily.  We will increase the pain medication, Hydrodcone-acetaminophen, dose slightly.  Take this three times a day only if needed for severe pain.  If you have any side effects please call.    You can try icing the shoulder if it is painful.

## 2015-06-22 NOTE — Progress Notes (Signed)
Pre visit review using our clinic review tool, if applicable. No additional management support is needed unless otherwise documented below in the visit note. 

## 2015-06-22 NOTE — Assessment & Plan Note (Signed)
Present for 2 months. No accident or injury No decreased range of motion Tingling in left arm intermittently Likely an internal shoulder problem, most likely cervical radiculopathy Continue diclofenac twice daily Try icing the shoulder We'll increase hydrocodone slightly, but advised her to take only as needed. Reviewed possible side effects Referred to Dr. Katrinka BlazingSmith for further evaluation and treatment

## 2015-06-22 NOTE — Progress Notes (Signed)
Subjective:    Patient ID: Susan BeachBetty Brady, female    DOB: [birth year], 70 y.o.   MRN: [id number]  HPI She is here for an acute visit for left shoulder pain.   Left shoulder: Shoulder pain started approximately 2 months ago without an accident or injury. I did see her last month and she was going to see Dr. Katrinka BlazingSmith for further evaluation and treatment, but her brother died and she had to cancel the appointment. She is still experiencing pain, but she feels it is slightly better. The pain is intermittent. She denies any radiating pain down the arm. She does have tingling intermittently in the arm. She states mild left hand weakness. She is taking diclofenac twice daily for other reasons. She has used the hydrocodone on occasion and she does not feel that it helped much. She is trying to keep the arm moving and doing exercises, which she is able to do.     Medications and allergies reviewed with patient and updated if appropriate.  Patient Active Problem List   Diagnosis Date Noted  . Essential hypertension 11/22/2014  . Diabetes mellitus (HCC) 11/22/2014  . Arthritis 11/22/2014  . Glaucoma 11/22/2014  . GERD (gastroesophageal reflux disease) 11/22/2014  . Varicose veins 11/22/2014  . Heart disease 11/22/2014  . Myocardial infarct, old 11/22/2014  . Superficial thrombophlebitis of lower extremity 11/22/2014    Current Outpatient Prescriptions on File Prior to Visit  Medication Sig Dispense Refill  . ACCU-CHEK AVIVA PLUS test strip TEST QD  5  . aspirin EC 81 MG tablet Take 1 tablet (81 mg total) by mouth daily.    Marland Kitchen. atorvastatin (LIPITOR) 20 MG tablet Take 1 tablet (20 mg total) by mouth daily. 90 tablet 1  . atorvastatin (LIPITOR) 20 MG tablet TAKE 1 TABLET(20 MG) BY MOUTH DAILY 90 tablet 3  . cholecalciferol (VITAMIN D) 1000 UNITS tablet Take 1,000 Units by mouth once a week.     . diclofenac (VOLTAREN) 75 MG EC tablet Take 1 tablet (75 mg total) by mouth 2 (two) times daily. 180  tablet 1  . furosemide (LASIX) 20 MG tablet Take 1 tablet (20 mg total) by mouth 2 (two) times daily. 180 tablet 1  . Garlic 1000 MG CAPS Take 1,000 mg by mouth daily.    . hydrALAZINE (APRESOLINE) 50 MG tablet Take 1.5 tablets (75 mg total) by mouth 3 (three) times daily. 405 tablet 3  . hydrochlorothiazide (HYDRODIURIL) 25 MG tablet Take 1 tablet (25 mg total) by mouth daily. 90 tablet 1  . HYDROcodone-acetaminophen (NORCO/VICODIN) 5-325 MG tablet Take 1 tablet by mouth every 6 (six) hours as needed. 30 tablet 0  . metFORMIN (GLUCOPHAGE) 500 MG tablet Take 1 tablet (500 mg total) by mouth daily. 90 tablet 1  . metoprolol (LOPRESSOR) 50 MG tablet Take 1 tablet (50 mg total) by mouth 2 (two) times daily. 180 tablet 3  . nystatin (MYCOSTATIN/NYSTOP) 100000 UNIT/GM POWD APPLY TO ABDOMEN FOLDS AND UNDER BREAST 2-3 TIMES DAILY 60 g 5  . omega-3 acid ethyl esters (LOVAZA) 1 G capsule Take 2 g by mouth 2 (two) times daily.    . vitamin E 400 UNIT capsule Take 400 Units by mouth daily.     No current facility-administered medications on file prior to visit.    Past Medical History  Diagnosis Date  . Hypertension   . CHF (congestive heart failure) (HCC)   . Diabetes mellitus without complication (HCC)     No past surgical  history on file.  Social History   Social History  . Marital Status: Divorced    Spouse Name: N/A  . Number of Children: N/A  . Years of Education: N/A   Social History Main Topics  . Smoking status: Former Games developer  . Smokeless tobacco: Not on file  . Alcohol Use: No  . Drug Use: No  . Sexual Activity: Not on file   Other Topics Concern  . Not on file   Social History Narrative    Family History  Problem Relation Age of Onset  . Heart disease Mother   . Alcohol abuse Father   . Alcohol abuse Sister   . Diabetes Sister   . Hypertension Sister   . Cancer Sister   . Alcohol abuse Brother   . Diabetes Brother   . Hypertension Brother     Review of  Systems See history of present illness    Objective:   Filed Vitals:   06/22/15 1114  BP: 114/64  Pulse: 46  Temp: 98.7 F (37.1 C)  Resp: 16   Filed Weights   06/22/15 1114  Weight: 215 lb (97.523 kg)   Body mass index is 30 kg/(m^2).   Physical Exam A left Shoulder exam was performed.   SWELLING: none  EFFUSION: no  WARMTH: no warmth  TENDERNESS: no tenderness on throughout shoulder joint, but it causes pressure inside the shoulder  ROM: full ROM with minimal pain NEUROLOGICAL EXAM: normal sensation and strength  PULSES: normal          Assessment & Plan:   See Problem List for Assessment and Plan of chronic medical problems.

## 2015-07-06 ENCOUNTER — Ambulatory Visit: Payer: Medicare Other | Admitting: Family Medicine

## 2015-07-24 ENCOUNTER — Ambulatory Visit (INDEPENDENT_AMBULATORY_CARE_PROVIDER_SITE_OTHER): Payer: Medicare Other | Admitting: Family Medicine

## 2015-07-24 ENCOUNTER — Encounter: Payer: Self-pay | Admitting: Family Medicine

## 2015-07-24 VITALS — BP 122/72 | HR 56 | Wt 202.0 lb

## 2015-07-24 DIAGNOSIS — E119 Type 2 diabetes mellitus without complications: Secondary | ICD-10-CM | POA: Diagnosis not present

## 2015-07-24 DIAGNOSIS — H2513 Age-related nuclear cataract, bilateral: Secondary | ICD-10-CM | POA: Diagnosis not present

## 2015-07-24 DIAGNOSIS — M25512 Pain in left shoulder: Secondary | ICD-10-CM | POA: Diagnosis not present

## 2015-07-24 DIAGNOSIS — H11053 Peripheral pterygium, progressive, bilateral: Secondary | ICD-10-CM | POA: Diagnosis not present

## 2015-07-24 NOTE — Progress Notes (Signed)
[facility] [address] [place], [place] [address] Phone: [phone] Subjective:    I'm seeing this patient by the request  of:  Pincus Sanes, MD   CC: Left shoulder pain  [id number] Susan Brady is a 70 y.o. female coming in with complaint of left shoulder pain. Had been going on for proximal wing 2 months. Does not remember any injury but did state for years ago she did fall encourage herself. Has had intermittent for quite some time but then it was severe. Patient's primary care provider and there was a concern for cervical radiculopathy. Sent here for further evaluation. Patient states that the pain was radiating down her arm. Having some mild numbness with it. States that she was having weakness initially but has improved. Patient states that she is 80-85% better. Still some mild limitation in range of motion. Still some mild discomfort at night. Nothing that is waking her up, states that she can do daily activities. Still has some discomfort when holding a heavy things with that arm.     Past Medical History  Diagnosis Date  . Hypertension   . CHF (congestive heart failure) (HCC)   . Diabetes mellitus without complication (HCC)    No past surgical history on file. Social History   Social History  . Marital Status: Divorced    Spouse Name: N/A  . Number of Children: N/A  . Years of Education: N/A   Social History Main Topics  . Smoking status: Former Games developer  . Smokeless tobacco: None  . Alcohol Use: No  . Drug Use: No  . Sexual Activity: Not Asked   Other Topics Concern  . None   Social History Narrative   Allergies  Allergen Reactions  . Lisinopril Anaphylaxis    cough   Family History  Problem Relation Age of Onset  . Heart disease Mother   . Alcohol abuse Father   . Alcohol abuse Sister   . Diabetes Sister   . Hypertension Sister   . Cancer Sister   . Alcohol abuse Brother   . Diabetes Brother   . Hypertension  Brother     Past medical history, social, surgical and family history all reviewed in electronic medical record.  No pertanent information unless stated regarding to the chief complaint.   Review of Systems: No headache, visual changes, nausea, vomiting, diarrhea, constipation, dizziness, abdominal pain, skin rash, fevers, chills, night sweats, weight loss, swollen lymph nodes, body aches, joint swelling, muscle aches, chest pain, shortness of breath, mood changes.   Objective Blood pressure 122/72, pulse 56, weight 202 lb (91.627 kg).  General: No apparent distress alert and oriented x3 mood and affect normal, dressed appropriately.  HEENT: Pupils equal, extraocular movements intact  Respiratory: Patient's speak in full sentences and does not appear short of breath  Cardiovascular: No lower extremity edema, non tender, no erythema  Skin: Warm dry intact with no signs of infection or rash on extremities or on axial skeleton.  Abdomen: Soft nontender  Neuro: Cranial nerves II through XII are intact, neurovascularly intact in all extremities with 2+ DTRs and 2+ pulses.  Lymph: No lymphadenopathy of posterior or anterior cervical chain or axillae bilaterally.  Gait normal with good balance and coordination.  MSK:  Non tender with full range of motion and good stability and symmetric strength and tone of  elbows, wrist, hip, knee and ankles bilaterally. Changes of multiple joints, mild arthritis Shoulder: Left Inspection reveals no abnormalities, atrophy  or asymmetry. Palpation is normal with no tenderness over AC joint or bicipital groove. Mild range of motion decreased with internal rotation to sacrum but does have full Rotator cuff strength normal throughout. Positive impingement signs noted Speeds and Yergason's tests normal. No labral pathology noted with negative Obrien's, negative clunk and good stability. Normal scapular function observed. No painful arc and no drop arm sign. No  apprehension sign Contralateral shoulder unremarkable   Procedure note 97110; 15 minutes spent for Therapeutic exercises as stated in above notes.  This included exercises focusing on stretching, strengthening, with significant focus on eccentric aspects.  Shoulder Exercises that included:  Basic scapular stabilization to include adduction and depression of scapula Scaption, focusing on proper movement and good control Internal and External rotation utilizing a theraband, with elbow tucked at side entire time Rows with theraband   Proper technique shown and discussed handout in great detail with ATC.  All questions were discussed and answered.      Impression and Recommendations:     This case required medical decision making of moderate complexity.      Note: This dictation was prepared with Dragon dictation along with smaller phrase technology. Any transcriptional errors that result from this process are unintentional.

## 2015-07-24 NOTE — Assessment & Plan Note (Signed)
?   Resolving frozen shoulder.  No weakness.  No Spurling today. Doing better Given pennsaid, HEP, icing protocol.  RTC in 4 weeks.  If worsening then come back sooner.

## 2015-07-24 NOTE — Patient Instructions (Signed)
Great to meet you Ice 20 minutes 2 times daily. Usually after activity and before bed. Exercises 3 times a week.  pennsaid pinkie amount topically 2 times daily as needed.  Vitamin D 2000 IU daily  Avoid any lifting where you cannot see your hands within your peripheral vision.  See me again in 4 weeks if it doesn't fully resolve.

## 2015-08-12 ENCOUNTER — Encounter (HOSPITAL_COMMUNITY): Payer: Self-pay | Admitting: Emergency Medicine

## 2015-08-12 ENCOUNTER — Other Ambulatory Visit: Payer: Self-pay

## 2015-08-12 ENCOUNTER — Emergency Department (HOSPITAL_COMMUNITY): Payer: Medicare Other

## 2015-08-12 ENCOUNTER — Emergency Department (HOSPITAL_COMMUNITY)
Admission: EM | Admit: 2015-08-12 | Discharge: 2015-08-12 | Disposition: A | Discharge status: Home / Self Care | Payer: Medicare Other | Attending: Emergency Medicine | Admitting: Emergency Medicine

## 2015-08-12 DIAGNOSIS — E119 Type 2 diabetes mellitus without complications: Secondary | ICD-10-CM | POA: Diagnosis not present

## 2015-08-12 DIAGNOSIS — R1011 Right upper quadrant pain: Secondary | ICD-10-CM | POA: Diagnosis not present

## 2015-08-12 DIAGNOSIS — R109 Unspecified abdominal pain: Secondary | ICD-10-CM | POA: Diagnosis not present

## 2015-08-12 DIAGNOSIS — I110 Hypertensive heart disease with heart failure: Secondary | ICD-10-CM | POA: Diagnosis not present

## 2015-08-12 DIAGNOSIS — Z7982 Long term (current) use of aspirin: Secondary | ICD-10-CM | POA: Insufficient documentation

## 2015-08-12 DIAGNOSIS — I509 Heart failure, unspecified: Secondary | ICD-10-CM | POA: Diagnosis not present

## 2015-08-12 DIAGNOSIS — K449 Diaphragmatic hernia without obstruction or gangrene: Secondary | ICD-10-CM | POA: Diagnosis not present

## 2015-08-12 DIAGNOSIS — Z7984 Long term (current) use of oral hypoglycemic drugs: Secondary | ICD-10-CM | POA: Insufficient documentation

## 2015-08-12 DIAGNOSIS — R0602 Shortness of breath: Secondary | ICD-10-CM | POA: Diagnosis not present

## 2015-08-12 DIAGNOSIS — Z87891 Personal history of nicotine dependence: Secondary | ICD-10-CM | POA: Insufficient documentation

## 2015-08-12 DIAGNOSIS — R1013 Epigastric pain: Secondary | ICD-10-CM | POA: Insufficient documentation

## 2015-08-12 LAB — URINALYSIS, ROUTINE W REFLEX MICROSCOPIC
GLUCOSE, UA: NEGATIVE mg/dL
HGB URINE DIPSTICK: NEGATIVE
KETONES UR: NEGATIVE mg/dL
Nitrite: NEGATIVE
PH: 5 (ref 5.0–8.0)
Protein, ur: NEGATIVE mg/dL
Specific Gravity, Urine: 1.021 (ref 1.005–1.030)

## 2015-08-12 LAB — COMPREHENSIVE METABOLIC PANEL
ALBUMIN: 4.5 g/dL (ref 3.5–5.0)
ALK PHOS: 39 U/L (ref 38–126)
ALT: 14 U/L (ref 14–54)
AST: 20 U/L (ref 15–41)
Anion gap: 9 (ref 5–15)
BILIRUBIN TOTAL: 0.8 mg/dL (ref 0.3–1.2)
BUN: 15 mg/dL (ref 6–20)
CO2: 30 mmol/L (ref 22–32)
CREATININE: 0.78 mg/dL (ref 0.44–1.00)
Calcium: 9.7 mg/dL (ref 8.9–10.3)
Chloride: 102 mmol/L (ref 101–111)
GFR calc Af Amer: >60 mL/min (ref >60)
GLUCOSE: 99 mg/dL (ref 65–99)
POTASSIUM: 3.5 mmol/L (ref 3.5–5.1)
Sodium: 141 mmol/L (ref 135–145)
TOTAL PROTEIN: 7.5 g/dL (ref 6.5–8.1)

## 2015-08-12 LAB — URINE MICROSCOPIC-ADD ON

## 2015-08-12 LAB — CBC WITH DIFFERENTIAL/PLATELET
BASOS ABS: 0 10*3/uL (ref 0.0–0.1)
BASOS PCT: 0 %
Eosinophils Absolute: 0.2 10*3/uL (ref 0.0–0.7)
Eosinophils Relative: 3 %
HEMATOCRIT: 35.5 % — AB (ref 36.0–46.0)
HEMOGLOBIN: 11.9 g/dL — AB (ref 12.0–15.0)
LYMPHS PCT: 44 %
Lymphs Abs: 2.5 10*3/uL (ref 0.7–4.0)
MCH: 28.7 pg (ref 26.0–34.0)
MCHC: 33.5 g/dL (ref 30.0–36.0)
MCV: 85.7 fL (ref 78.0–100.0)
MONO ABS: 0.5 10*3/uL (ref 0.1–1.0)
Monocytes Relative: 9 %
NEUTROS ABS: 2.5 10*3/uL (ref 1.7–7.7)
NEUTROS PCT: 44 %
Platelets: 344 10*3/uL (ref 150–400)
RBC: 4.14 MIL/uL (ref 3.87–5.11)
RDW: 13.6 % (ref 11.5–15.5)
WBC: 5.7 10*3/uL (ref 4.0–10.5)

## 2015-08-12 LAB — BRAIN NATRIURETIC PEPTIDE: B NATRIURETIC PEPTIDE 5: 82 pg/mL (ref 0.0–100.0)

## 2015-08-12 LAB — LIPASE, BLOOD: LIPASE: 32 U/L (ref 11–51)

## 2015-08-12 LAB — I-STAT CG4 LACTIC ACID, ED: Lactic Acid, Venous: 0.89 mmol/L (ref 0.5–2.0)

## 2015-08-12 LAB — I-STAT TROPONIN, ED: Troponin i, poc: 0 ng/mL (ref 0.00–0.08)

## 2015-08-12 MED ORDER — GI COCKTAIL ~~LOC~~
30.0000 mL | Freq: Once | ORAL | Status: AC
  Administered 2015-08-12: 30 mL via ORAL
  Filled 2015-08-12 (×2): qty 30

## 2015-08-12 MED ORDER — DIATRIZOATE MEGLUMINE & SODIUM 66-10 % PO SOLN
15.0000 mL | Freq: Once | ORAL | Status: AC
  Administered 2015-08-12: 15 mL via ORAL

## 2015-08-12 MED ORDER — IOPAMIDOL (ISOVUE-300) INJECTION 61%
100.0000 mL | Freq: Once | INTRAVENOUS | Status: AC | PRN
  Administered 2015-08-12: 100 mL via INTRAVENOUS

## 2015-08-12 MED ORDER — SUCRALFATE 1 GM/10ML PO SUSP: 1.0000 g | Freq: Three times a day (TID) | ORAL | Status: DC

## 2015-08-12 MED ORDER — ONDANSETRON HCL 4 MG PO TABS: 4.0000 mg | ORAL_TABLET | Freq: Four times a day (QID) | ORAL | Status: DC

## 2015-08-12 NOTE — ED Provider Notes (Signed)
CSN: [id number]     Arrival date & time 08/12/15  1604 History   First MD Initiated Contact with Patient 08/12/15 1632     Chief Complaint  Patient presents with  . Abdominal Pain  . Shortness of Breath     (Consider location/radiation/quality/duration/timing/severity/associated sxs/prior Treatment) HPI Comments: 70 y.o. Female with history of HTN, CHF, DM presents for shortness of breath and abdominal pain.  The patient and her daughter report that the symptoms began about 1 week ago.  They report that the patient had drank a chocolate milk at that time and this was followed by an upset stomach and diarrhea that lasted for a few days.  The diarrhea has since resolved but the patient has continued to have abdominal discomfort.  She also reports that over the last 2-3 days she has had intermittent shortness of breath.  Denies vomiting, fever, chills, urinary changes.  No chest pain.  She reports pain is mostly in the upper abdomen but is also lower on the right side.  Spoke with her PCP's office two days ago and was told to take antiacid that did provide some minor relief but symptoms never resolved.  Patient is a 70 y.o. female presenting with abdominal pain and shortness of breath.  Abdominal Pain Associated symptoms: nausea and shortness of breath   Associated symptoms: no chest pain, no chills, no constipation, no cough, no diarrhea (resolved), no dysuria, no fatigue, no fever, no hematuria and no vomiting   Shortness of Breath Associated symptoms: abdominal pain   Associated symptoms: no chest pain, no cough, no fever, no headaches, no rash, no vomiting and no wheezing     Past Medical History  Diagnosis Date  . Hypertension   . CHF (congestive heart failure) (HCC)   . Diabetes mellitus without complication (HCC)    History reviewed. No pertinent past surgical history. Family History  Problem Relation Age of Onset  . Heart disease Mother   . Alcohol abuse Father   . Alcohol abuse  Sister   . Diabetes Sister   . Hypertension Sister   . Cancer Sister   . Alcohol abuse Brother   . Diabetes Brother   . Hypertension Brother    Social History  Substance Use Topics  . Smoking status: Former Games developer  . Smokeless tobacco: None  . Alcohol Use: No   OB History    No data available     Review of Systems  Constitutional: Positive for appetite change. Negative for fever, chills and fatigue.  HENT: Negative for congestion, postnasal drip, rhinorrhea and sinus pressure.   Eyes: Negative for visual disturbance.  Respiratory: Positive for shortness of breath. Negative for cough, chest tightness and wheezing.   Cardiovascular: Negative for chest pain, palpitations and leg swelling.  Gastrointestinal: Positive for nausea and abdominal pain. Negative for vomiting, diarrhea (resolved) and constipation.  Genitourinary: Negative for dysuria, urgency and hematuria.  Musculoskeletal: Negative for myalgias and back pain.  Skin: Negative for rash.  Neurological: Negative for dizziness, weakness, light-headedness and headaches.  Hematological: Does not bruise/bleed easily.      Allergies  Lisinopril  Home Medications   Prior to Admission medications   Medication Sig Start Date End Date Taking? Authorizing Provider  ACCU-CHEK AVIVA PLUS test strip TEST QD 09/18/14   Historical Provider, MD  aspirin EC 81 MG tablet Take 1 tablet (81 mg total) by mouth daily. 03/24/15   Pincus Sanes, MD  atorvastatin (LIPITOR) 20 MG tablet Take 1 tablet (20  mg total) by mouth daily. 03/24/15   Pincus Sanes, MD  atorvastatin (LIPITOR) 20 MG tablet TAKE 1 TABLET(20 MG) BY MOUTH DAILY 05/26/15   Pincus Sanes, MD  cholecalciferol (VITAMIN D) 1000 UNITS tablet Take 1,000 Units by mouth once a week.     Historical Provider, MD  diclofenac (VOLTAREN) 75 MG EC tablet Take 1 tablet (75 mg total) by mouth 2 (two) times daily. 03/24/15   Pincus Sanes, MD  furosemide (LASIX) 20 MG tablet Take 1 tablet (20 mg  total) by mouth 2 (two) times daily. 03/24/15   Pincus Sanes, MD  Garlic 1000 MG CAPS Take 1,000 mg by mouth daily.    Historical Provider, MD  hydrALAZINE (APRESOLINE) 50 MG tablet Take 1.5 tablets (75 mg total) by mouth 3 (three) times daily. 03/24/15   Pincus Sanes, MD  hydrochlorothiazide (HYDRODIURIL) 25 MG tablet Take 1 tablet (25 mg total) by mouth daily. 06/22/15   Pincus Sanes, MD  HYDROcodone-acetaminophen (NORCO) 7.5-325 MG tablet Take 1 tablet by mouth every 8 (eight) hours as needed for severe pain. 06/22/15   Pincus Sanes, MD  metFORMIN (GLUCOPHAGE) 500 MG tablet Take 1 tablet (500 mg total) by mouth daily. 06/22/15   Pincus Sanes, MD  metoprolol (LOPRESSOR) 50 MG tablet Take 1 tablet (50 mg total) by mouth 2 (two) times daily. 03/24/15   Pincus Sanes, MD  nystatin (MYCOSTATIN/NYSTOP) 100000 UNIT/GM POWD APPLY TO ABDOMEN FOLDS AND UNDER BREAST 2-3 TIMES DAILY 03/24/15   Pincus Sanes, MD  omega-3 acid ethyl esters (LOVAZA) 1 G capsule Take 2 g by mouth 2 (two) times daily.    Historical Provider, MD  ondansetron (ZOFRAN) 4 MG tablet Take 1 tablet (4 mg total) by mouth every 6 (six) hours. 08/12/15   Leta Baptist, MD  sucralfate (CARAFATE) 1 GM/10ML suspension Take 10 mLs (1 g total) by mouth 4 (four) times daily -  with meals and at bedtime. 08/12/15   Leta Baptist, MD  vitamin E 400 UNIT capsule Take 400 Units by mouth daily.    Historical Provider, MD   BP 131/68 mmHg  Pulse 46  Temp(Src) 98.2 F (36.8 C) (Oral)  Resp 14  SpO2 96% Physical Exam  Constitutional: She is oriented to person, place, and time. She appears well-developed and well-nourished. No distress.  HENT:  Head: Normocephalic and atraumatic.  Right Ear: External ear normal.  Left Ear: External ear normal.  Nose: Nose normal.  Mouth/Throat: Oropharynx is clear and moist. No oropharyngeal exudate.  Eyes: EOM are normal. Pupils are equal, round, and reactive to light.  Neck: Normal range of motion. Neck supple.   Cardiovascular: Regular rhythm, normal heart sounds and intact distal pulses.  Bradycardia present.   No murmur heard. Pulmonary/Chest: Effort normal. No accessory muscle usage. No respiratory distress. She has decreased breath sounds (mild, diffuse, symmetric). She has no wheezes. She has no rales.  Abdominal: Soft. She exhibits no distension. There is tenderness (mild to moderate) in the right upper quadrant, right lower quadrant and epigastric area. There is no CVA tenderness.  Musculoskeletal: Normal range of motion. She exhibits no edema or tenderness.  Neurological: She is alert and oriented to person, place, and time.  Skin: Skin is warm and dry. No rash noted. She is not diaphoretic.  Vitals reviewed.   ED Course  Procedures (including critical care time) Labs Review Labs Reviewed  CBC WITH DIFFERENTIAL/PLATELET - Abnormal; Notable for the following:  Hemoglobin 11.9 (*)    HCT 35.5 (*)    All other components within normal limits  URINALYSIS, ROUTINE W REFLEX MICROSCOPIC (NOT AT [facility]) - Abnormal; Notable for the following:    Color, Urine AMBER (*)    APPearance HAZY (*)    Bilirubin Urine SMALL (*)    Leukocytes, UA SMALL (*)    All other components within normal limits  URINE MICROSCOPIC-ADD ON - Abnormal; Notable for the following:    Squamous Epithelial / LPF 0-5 (*)    Bacteria, UA RARE (*)    Casts HYALINE CASTS (*)    All other components within normal limits  COMPREHENSIVE METABOLIC PANEL  LIPASE, BLOOD  BRAIN NATRIURETIC PEPTIDE  I-STAT CG4 LACTIC ACID, ED  I-STAT TROPOININ, ED    Imaging Review Ct Abdomen Pelvis W Contrast  08/12/2015  CLINICAL DATA:  Diarrhea for 1 week.  Shortness of breath. EXAM: CT ABDOMEN AND PELVIS WITH CONTRAST TECHNIQUE: Multidetector CT imaging of the abdomen and pelvis was performed using the standard protocol following bolus administration of intravenous contrast. CONTRAST:  100mL ISOVUE-300 IOPAMIDOL (ISOVUE-300) INJECTION 61%  COMPARISON:  None. FINDINGS: There is a moderate-sized hiatal hernia. The lung bases are otherwise within normal limits. No free air or free fluid identified. The gallbladder is mildly distended but otherwise normal. The liver and portal vein are within normal limits. The spleen, adrenal glands, pancreas, kidneys are normal. The abdominal aorta is normal in caliber with mild atherosclerotic change. No adenopathy. The stomach and small bowel are otherwise normal. The colon is unremarkable. The appendix is well seen on the frontal view with no evidence of appendicitis. No masses or adenopathy seen within the deep pelvis. The bladder is decompressed but otherwise normal her Degenerative changes seen within the spine. The pubic symphysis is widened with degenerative change. No acute bony abnormalities. Delayed images demonstrate no filling defects in the upper renal collecting systems. IMPRESSION: 1. The gallbladder is mildly distended but otherwise normal. 2. Moderate hiatal hernia. 3. Mild atherosclerotic change in the abdominal aorta. Electronically Signed   By: Gerome Samavid  Williams III M.D   On: 08/12/2015 18:33   Dg Abd Acute W/chest  08/12/2015  CLINICAL DATA:  Shortness of breath and upper abdominal pain. Diarrhea. EXAM: DG ABDOMEN ACUTE W/ 1V CHEST COMPARISON:  Chest x-ray November 26, 2012 FINDINGS: No acute abnormalities are seen in the chest. The chest is stable. No pneumothorax, pulmonary nodule, mass, or infiltrate. No cardiomegaly. The hila and mediastinum are unchanged. No free air, portal venous gas, or pneumatosis. There is a paucity of bowel gas limiting evaluation but no evidence of obstruction. Mild widening of the pubic symphysis is of doubtful acute significance given history. The bones are otherwise unremarkable. IMPRESSION: No acute abnormalities. Electronically Signed   By: Gerome Samavid  Williams III M.D   On: 08/12/2015 17:08   [place] Abdomen Limited Ruq  08/12/2015  CLINICAL DATA:  70 year old female with  a history of right upper quadrant pain for 1 week EXAM: US ABDOMEN LIMITED - RIGHT UPPER QUADRANT COMPARISON:  CT of today's date 08/12/2015 FINDINGS: Gallbladder: Gallbladder measures as long as 13 cm, with no wall thickening, pericholecystic fluid, or echogenic debris/ stones. Negative sonographic Murphy's sign. Common bile duct: Diameter: 3 mm -4 mm Liver: No focal lesion identified. Within normal limits in parenchymal echogenicity. IMPRESSION: Gallbladder is somewhat enlarged, though with no specific sonographic signs for acute cholecystitis. No evidence of cholelithiasis. If there is ongoing concern for biliary abnormality, correlation with HIDA study  may be considered. Signed, Yvone NeuJaime S. Loreta AveWagner, DO [facility] Electronically Signed   By: Gilmer MorJaime  Wagner D.O.   On: 08/12/2015 20:46   I have personally reviewed and evaluated these images and lab results as part of my medical decision-making.   EKG Interpretation   Date/Time:  Saturday August 12 2015 16:24:00 EDT Ventricular Rate:  47 PR Interval:    QRS Duration: 94 QT Interval:  456 QTC Calculation: 404 R Axis:   -54 Text Interpretation:  Sinus bradycardia Left anterior fascicular block  Probable anterior infarct, age indeterminate Rate decreased when compared  to previous tracing Confirmed by Tyrone AppleNGUYEN, Meiya Wisler ([id number]) on 08/12/2015  5:16:48 PM      MDM  Patient seen and evaluated in stable condition.  Laboratory studies were unremarkable.  Chest xray without acute process.  CT abdomen/pelvis with enlarged gall bladder but no other acute finding.  US RUQ with enlarged gallbladder but again no other acute inflammatory or obstructive findings.  Patient was given GI cocktail with improvement in symptoms.  Patient tolerated PO challenge.  All results and clinical impression discussed with patient and daughter at bedside who expressed understanding and agreement with plan of care.  Patient  discharged home in stable condition with strict return precautions and instruction to follow up outpatient. Final diagnoses:  Abdominal pain  Epigastric pain    1. Epigastric pain, likely gastritis    Leta BaptistEmily Roe Blake Goya, MD 08/13/15 [phone]

## 2015-08-12 NOTE — ED Notes (Signed)
CRACKERS AND GINGER ALE GIVEN FOR PO CHALLENGE.

## 2015-08-12 NOTE — ED Notes (Signed)
Pt c/o upper abdominal, shortness of breath and loose watery stools for a week. States nausea but no vomiting.

## 2015-08-12 NOTE — Discharge Instructions (Signed)
You were seen and evaluated for your abdominal pain. On her results your gallbladder appears enlarged but there is no other signs of inflammation. You were given a medication that helps with pain from stomach acid. You've been prescribed a medication that will help with the same. Continue to take the Prilosec you were prescribed. Follow-up with your primary care physician as soon as possible as well as with a stomach doctor whose referral is provided. Return to the emergency department for worsening symptoms.  Gastritis, Adult Gastritis is soreness and swelling (inflammation) of the lining of the stomach. Gastritis can develop as a sudden onset (acute) or long-term (chronic) condition. If gastritis is not treated, it can lead to stomach bleeding and ulcers. CAUSES  Gastritis occurs when the stomach lining is weak or damaged. Digestive juices from the stomach then inflame the weakened stomach lining. The stomach lining may be weak or damaged due to viral or bacterial infections. One common bacterial infection is the Helicobacter pylori infection. Gastritis can also result from excessive alcohol consumption, taking certain medicines, or having too much acid in the stomach.  SYMPTOMS  In some cases, there are no symptoms. When symptoms are present, they may include:  Pain or a burning sensation in the upper abdomen.  Nausea.  Vomiting.  An uncomfortable feeling of fullness after eating. DIAGNOSIS  Your caregiver may suspect you have gastritis based on your symptoms and a physical exam. To determine the cause of your gastritis, your caregiver may perform the following:  Blood or stool tests to check for the H pylori bacterium.  Gastroscopy. A thin, flexible tube (endoscope) is passed down the esophagus and into the stomach. The endoscope has a light and camera on the end. Your caregiver uses the endoscope to view the inside of the stomach.  Taking a tissue sample (biopsy) from the stomach to  examine under a microscope. TREATMENT  Depending on the cause of your gastritis, medicines may be prescribed. If you have a bacterial infection, such as an H pylori infection, antibiotics may be given. If your gastritis is caused by too much acid in the stomach, H2 blockers or antacids may be given. Your caregiver may recommend that you stop taking aspirin, ibuprofen, or other nonsteroidal anti-inflammatory drugs (NSAIDs). HOME CARE INSTRUCTIONS  Only take over-the-counter or prescription medicines as directed by your caregiver.  If you were given antibiotic medicines, take them as directed. Finish them even if you start to feel better.  Drink enough fluids to keep your urine clear or pale yellow.  Avoid foods and drinks that make your symptoms worse, such as:  Caffeine or alcoholic drinks.  Chocolate.  Peppermint or mint flavorings.  Garlic and onions.  Spicy foods.  Citrus fruits, such as oranges, lemons, or limes.  Tomato-based foods such as sauce, chili, salsa, and pizza.  Fried and fatty foods.  Eat small, frequent meals instead of large meals. SEEK IMMEDIATE MEDICAL CARE IF:   You have black or dark red stools.  You vomit blood or material that looks like coffee grounds.  You are unable to keep fluids down.  Your abdominal pain gets worse.  You have a fever.  You do not feel better after 1 week.  You have any other questions or concerns. MAKE SURE YOU:  Understand these instructions.  Will watch your condition.  Will get help right away if you are not doing well or get worse.   This information is not intended to replace advice given to you by  your health care provider. Make sure you discuss any questions you have with your health care provider.   Document Released: 01/29/2001 Document Revised: 08/06/2011 Document Reviewed: 03/20/2011 Elsevier Interactive Patient Education [employer]2016 Elsevier Inc.

## 2015-08-14 DIAGNOSIS — R1030 Lower abdominal pain, unspecified: Secondary | ICD-10-CM | POA: Diagnosis not present

## 2015-08-14 DIAGNOSIS — R1013 Epigastric pain: Secondary | ICD-10-CM | POA: Diagnosis not present

## 2015-08-14 DIAGNOSIS — Z1211 Encounter for screening for malignant neoplasm of colon: Secondary | ICD-10-CM | POA: Diagnosis not present

## 2015-08-15 ENCOUNTER — Encounter: Payer: Medicare Other | Admitting: Internal Medicine

## 2015-08-15 DIAGNOSIS — K2970 Gastritis, unspecified, without bleeding: Secondary | ICD-10-CM | POA: Insufficient documentation

## 2015-08-15 NOTE — Patient Instructions (Signed)
  Test(s) ordered today. Your results will be released to MyChart (or called to you) after review, usually within 72hours after test completion. If any changes need to be made, you will be notified at that same time.  All other Health Maintenance issues reviewed.   All recommended immunizations and age-appropriate screenings are up-to-date or discussed.  No immunizations administered today.   Medications reviewed and updated.  Changes include  /  No changes recommended at this time.  Your prescription(s) have been submitted to your pharmacy. Please take as directed and contact our office if you believe you are having problem(s) with the medication(s).  A referral  Please followup in    

## 2015-08-15 NOTE — Assessment & Plan Note (Signed)
Presumed diagnosis from ED 08/12/15 Prescribed carafate and zofran

## 2015-08-15 NOTE — Progress Notes (Signed)
Subjective:    Patient ID: Susan Brady, female    DOB: 01-28-1946, 70 y.o.   MRN: [id number]  HPI error   Medications and allergies reviewed with patient and updated if appropriate.  Patient Active Problem List   Diagnosis Date Noted  . Left shoulder pain 06/22/2015  . Essential hypertension 11/22/2014  . Diabetes mellitus (HCC) 11/22/2014  . Arthritis 11/22/2014  . Glaucoma 11/22/2014  . GERD (gastroesophageal reflux disease) 11/22/2014  . Varicose veins 11/22/2014  . Heart disease 11/22/2014  . Myocardial infarct, old 11/22/2014  . Superficial thrombophlebitis of lower extremity 11/22/2014    Current Outpatient Prescriptions on File Prior to Visit  Medication Sig Dispense Refill  . ACCU-CHEK AVIVA PLUS test strip TEST QD  5  . aspirin EC 81 MG tablet Take 1 tablet (81 mg total) by mouth daily.    Marland Kitchen. atorvastatin (LIPITOR) 20 MG tablet Take 1 tablet (20 mg total) by mouth daily. 90 tablet 1  . atorvastatin (LIPITOR) 20 MG tablet TAKE 1 TABLET(20 MG) BY MOUTH DAILY 90 tablet 3  . cholecalciferol (VITAMIN D) 1000 UNITS tablet Take 1,000 Units by mouth once a week.     . diclofenac (VOLTAREN) 75 MG EC tablet Take 1 tablet (75 mg total) by mouth 2 (two) times daily. 180 tablet 1  . furosemide (LASIX) 20 MG tablet Take 1 tablet (20 mg total) by mouth 2 (two) times daily. 180 tablet 1  . Garlic 1000 MG CAPS Take 1,000 mg by mouth daily.    . hydrALAZINE (APRESOLINE) 50 MG tablet Take 1.5 tablets (75 mg total) by mouth 3 (three) times daily. 405 tablet 3  . hydrochlorothiazide (HYDRODIURIL) 25 MG tablet Take 1 tablet (25 mg total) by mouth daily. 90 tablet 3  . HYDROcodone-acetaminophen (NORCO) 7.5-325 MG tablet Take 1 tablet by mouth every 8 (eight) hours as needed for severe pain. 30 tablet 0  . metFORMIN (GLUCOPHAGE) 500 MG tablet Take 1 tablet (500 mg total) by mouth daily. 90 tablet 1  . metoprolol (LOPRESSOR) 50 MG tablet Take 1 tablet (50 mg total) by mouth 2 (two) times  daily. 180 tablet 3  . nystatin (MYCOSTATIN/NYSTOP) 100000 UNIT/GM POWD APPLY TO ABDOMEN FOLDS AND UNDER BREAST 2-3 TIMES DAILY 60 g 5  . omega-3 acid ethyl esters (LOVAZA) 1 G capsule Take 2 g by mouth 2 (two) times daily.    . ondansetron (ZOFRAN) 4 MG tablet Take 1 tablet (4 mg total) by mouth every 6 (six) hours. 12 tablet 0  . sucralfate (CARAFATE) 1 GM/10ML suspension Take 10 mLs (1 g total) by mouth 4 (four) times daily -  with meals and at bedtime. 420 mL 0  . vitamin E 400 UNIT capsule Take 400 Units by mouth daily.     No current facility-administered medications on file prior to visit.    Past Medical History  Diagnosis Date  . Hypertension   . CHF (congestive heart failure) (HCC)   . Diabetes mellitus without complication (HCC)     No past surgical history on file.  Social History   Social History  . Marital Status: Divorced    Spouse Name: N/A  . Number of Children: N/A  . Years of Education: N/A   Social History Main Topics  . Smoking status: Former Games developermoker  . Smokeless tobacco: Not on file  . Alcohol Use: No  . Drug Use: No  . Sexual Activity: Not on file   Other Topics Concern  . Not on  file   Social History Narrative    Family History  Problem Relation Age of Onset  . Heart disease Mother   . Alcohol abuse Father   . Alcohol abuse Sister   . Diabetes Sister   . Hypertension Sister   . Cancer Sister   . Alcohol abuse Brother   . Diabetes Brother   . Hypertension Brother     Review of Systems     Objective:  There were no vitals filed for this visit. There were no vitals filed for this visit. There is no weight on file to calculate BMI.   Physical Exam        Assessment & Plan:      This encounter was created in error - please disregard.

## 2015-08-18 DIAGNOSIS — R1013 Epigastric pain: Secondary | ICD-10-CM | POA: Diagnosis not present

## 2015-08-18 DIAGNOSIS — K259 Gastric ulcer, unspecified as acute or chronic, without hemorrhage or perforation: Secondary | ICD-10-CM | POA: Diagnosis not present

## 2015-08-18 DIAGNOSIS — K3189 Other diseases of stomach and duodenum: Secondary | ICD-10-CM | POA: Diagnosis not present

## 2015-08-18 DIAGNOSIS — K449 Diaphragmatic hernia without obstruction or gangrene: Secondary | ICD-10-CM | POA: Diagnosis not present

## 2015-08-18 DIAGNOSIS — K319 Disease of stomach and duodenum, unspecified: Secondary | ICD-10-CM | POA: Diagnosis not present

## 2015-08-18 DIAGNOSIS — K2970 Gastritis, unspecified, without bleeding: Secondary | ICD-10-CM | POA: Diagnosis not present

## 2015-08-23 ENCOUNTER — Telehealth: Payer: Self-pay | Admitting: Internal Medicine

## 2015-08-23 NOTE — Telephone Encounter (Signed)
Susan Brady, daughter, request to speak to the assistant concern about [name] has diarrhea in the past 2 days. Please call her back

## 2015-08-23 NOTE — Telephone Encounter (Signed)
Have her make an appt -- most likely will resolve over the several days

## 2015-08-23 NOTE — Telephone Encounter (Signed)
Had end[facility] done Friday 09/17/15 [facility]. Having bowel movement 4 xs a day, states it is just clear liquid. Has only had one bowel movement this morning. Pt has not tried to take any anti diarrhea medication. Pt does not have much of an appetite. Please advise if pt should come in for an appt.

## 2015-08-24 NOTE — Telephone Encounter (Signed)
Spoke with pts daughter. Pt has a follow-up appt on 08/30/15. Pt only wants to see Dr Lawerance BachBurns. Informed pts daughter that we did not have any openings and there was a Saturday Clinic on the weekends if she felt she needed to come in before the 12th.

## 2015-08-30 ENCOUNTER — Ambulatory Visit (INDEPENDENT_AMBULATORY_CARE_PROVIDER_SITE_OTHER): Payer: Medicare Other | Admitting: Internal Medicine

## 2015-08-30 ENCOUNTER — Encounter: Payer: Self-pay | Admitting: Internal Medicine

## 2015-08-30 VITALS — BP 106/70 | HR 54 | Temp 98.1°F | Resp 16 | Wt 200.0 lb

## 2015-08-30 DIAGNOSIS — M1990 Unspecified osteoarthritis, unspecified site: Secondary | ICD-10-CM | POA: Diagnosis not present

## 2015-08-30 DIAGNOSIS — K259 Gastric ulcer, unspecified as acute or chronic, without hemorrhage or perforation: Secondary | ICD-10-CM | POA: Insufficient documentation

## 2015-08-30 DIAGNOSIS — R630 Anorexia: Secondary | ICD-10-CM | POA: Diagnosis not present

## 2015-08-30 DIAGNOSIS — K449 Diaphragmatic hernia without obstruction or gangrene: Secondary | ICD-10-CM | POA: Insufficient documentation

## 2015-08-30 DIAGNOSIS — K253 Acute gastric ulcer without hemorrhage or perforation: Secondary | ICD-10-CM

## 2015-08-30 DIAGNOSIS — R2681 Unsteadiness on feet: Secondary | ICD-10-CM | POA: Diagnosis not present

## 2015-08-30 MED ORDER — OMEPRAZOLE 20 MG PO CPDR: 20.0000 mg | DELAYED_RELEASE_CAPSULE | Freq: Every day | ORAL | Status: DC

## 2015-08-30 NOTE — Patient Instructions (Addendum)
Stop diclofenac Stop hydrocodone Take tylenol extra strength 2 tabs three times a day for pain  Continue omeprazole 20 mg daily Continue carafate as prescribed to help treat ulcer  To help the ulcer heal avoid: advil, aleve, ibuprofen Spicy foods Chocolate Tea, coffee Citrus foods - tomatoes, oranges, orange juice, lemons Mint Alcohol Do not lay down with in three hours of eating  The stomach ulcer will heal over the next several weeks  Consider doing physical therapy for your unsteadiness  Follow up in 4 weeks  Peptic Ulcer A peptic ulcer is a sore in the lining of your esophagus (esophageal ulcer), stomach (gastric ulcer), or in the first part of your small intestine (duodenal ulcer). The ulcer causes erosion into the deeper tissue. CAUSES  Normally, the lining of the stomach and the small intestine protects itself from the acid that digests food. The protective lining can be damaged by:  An infection caused by a bacterium called Helicobacter pylori (H. pylori).  Regular use of nonsteroidal anti-inflammatory drugs (NSAIDs), such as ibuprofen or aspirin.  Smoking tobacco. Other risk factors include being older than 50, drinking alcohol excessively, and having a family history of ulcer disease.  SYMPTOMS   Burning pain or gnawing in the area between the chest and the belly button.  Heartburn.  Nausea and vomiting.  Bloating. The pain can be worse on an empty stomach and at night. If the ulcer results in bleeding, it can cause:  Black, tarry stools.  Vomiting of bright red blood.  Vomiting of coffee-ground-looking materials. DIAGNOSIS  A diagnosis is usually made based upon your history and an exam. Other tests and procedures may be performed to find the cause of the ulcer. Finding a cause will help determine the best treatment. Tests and procedures may include:  Blood tests, stool tests, or breath tests to check for the bacterium H. pylori.  An upper  gastrointestinal (GI) series of the esophagus, stomach, and small intestine.  An endoscopy to examine the esophagus, stomach, and small intestine.  A biopsy. TREATMENT  Treatment may include:  Eliminating the cause of the ulcer, such as smoking, NSAIDs, or alcohol.  Medicines to reduce the amount of acid in your digestive tract.  Antibiotic medicines if the ulcer is caused by the H. pylori bacterium.  An upper endoscopy to treat a bleeding ulcer.  Surgery if the bleeding is severe or if the ulcer created a hole somewhere in the digestive system. HOME CARE INSTRUCTIONS   Avoid tobacco, alcohol, and caffeine. Smoking can increase the acid in the stomach, and continued smoking will impair the healing of ulcers.  Avoid foods and drinks that seem to cause discomfort or aggravate your ulcer.  Only take medicines as directed by your caregiver. Do not substitute over-the-counter medicines for prescription medicines without talking to your caregiver.  Keep any follow-up appointments and tests as directed. SEEK MEDICAL CARE IF:   Your do not improve within 7 days of starting treatment.  You have ongoing indigestion or heartburn. SEEK IMMEDIATE MEDICAL CARE IF:   You have sudden, sharp, or persistent abdominal pain.  You have bloody or dark black, tarry stools.  You vomit blood or vomit that looks like coffee grounds.  You become light-headed, weak, or feel faint.  You become sweaty or clammy. MAKE SURE YOU:   Understand these instructions.  Will watch your condition.  Will get help right away if you are not doing well or get worse.   This information is not intended  to replace advice given to you by your health care provider. Make sure you discuss any questions you have with your health care provider.   Document Released: 02/02/2000 Document Revised: 02/25/2014 Document Reviewed: 09/04/2011 Elsevier Interactive Patient Education [employer]

## 2015-08-30 NOTE — Progress Notes (Signed)
Pre visit review using our clinic review tool, if applicable. No additional management support is needed unless otherwise documented below in the visit note. 

## 2015-08-30 NOTE — Assessment & Plan Note (Signed)
EGD last week at [facility] - report not available Pt told she had an ulcer Continue omeprazole 20 mg daily Continue carafate GERD diet Stop diclofenac

## 2015-08-30 NOTE — Assessment & Plan Note (Signed)
D/c diclofenac and hydrocodone Tylenol as needed only Arthritis is in her hands only

## 2015-08-30 NOTE — Assessment & Plan Note (Signed)
Keep walking regularly Discussed neuro evaluation or PT She deferred above for now

## 2015-08-30 NOTE — Progress Notes (Signed)
Subjective:    Patient ID: Susan Brady, female    DOB: November 25, 1945, 70 y.o.   MRN: [id number]  HPI She is here today for hospital follow up.   She went to the ED 6/24 for abdominal pain and shortness of breath.  Her symptoms started one week prior to her going to the ED.  She had drank chocolate milk that caused an upset stomach and diarrhea that lasted a few days.  The diarrhea resolved, but the abdominal pain persisted.  The pain was in the upper abdomen and lower right abdomen.  She was having intermittent shortness of breath for a few days prior to going to the ED as well.  She denied other symptoms (fever, vomiting, chest pain, urinary symptoms).  Her abdomen was tender in the ED in the RUQ, RLQ and epigastric regions.  Her CXR was normal, Ct of the ABD/Pelvis was negative for an acute process. [place] of RUQ showed an enlarged GB, but acute inflammation.  Labs were unremarkable.  Her EKG was unchanged.  GI cocktail improved her symptoms.  She was discharged home with a likely diagnosis of gastritis.    Since leaving the ED, when she wants something to eat she will fix herself food and then does not have an appetite.  She will only eat a little bit.  Initally she was not able to eat anything and now she can eat a little, so it has improved.  Chocolate milk does upset her stoamch.  She has occasional dull pain in her epigastric region. She denies any other pain in her stomach except the epigastric pain.  The epigastric pain has gotten better - she gets it less often and it is not as bad.  Her bowels are not regular - it can be "weeks before she can go to the bathroom".    She does have nausea.  She denies GERD.  She burps a lot. She is taking omeprazole 20 mg daily and carafate.  She has seen GI at [facility] since being discharged.  She had an EGD last week and she was told she had an ulcer.  I have not received the report yet.    Unsteadiness:  For the past months or so she has felt more unsteady when  she walks.  She denies any falls.  She does do some walking regularly.   Medications and allergies reviewed with patient and updated if appropriate.  Patient Active Problem List   Diagnosis Date Noted  . Hiatal hernia, moderate 08/30/2015  . Gastritis 08/15/2015  . Left shoulder pain 06/22/2015  . Essential hypertension 11/22/2014  . Diabetes mellitus (HCC) 11/22/2014  . Arthritis 11/22/2014  . Glaucoma 11/22/2014  . GERD (gastroesophageal reflux disease) 11/22/2014  . Varicose veins 11/22/2014  . Heart disease 11/22/2014  . Myocardial infarct, old 11/22/2014  . Superficial thrombophlebitis of lower extremity 11/22/2014    Current Outpatient Prescriptions on File Prior to Visit  Medication Sig Dispense Refill  . ACCU-CHEK AVIVA PLUS test strip TEST QD  5  . aspirin EC 81 MG tablet Take 1 tablet (81 mg total) by mouth daily.    Marland Kitchen atorvastatin (LIPITOR) 20 MG tablet TAKE 1 TABLET(20 MG) BY MOUTH DAILY 90 tablet 3  . cholecalciferol (VITAMIN D) 1000 UNITS tablet Take 1,000 Units by mouth once a week.     . furosemide (LASIX) 20 MG tablet Take 1 tablet (20 mg total) by mouth 2 (two) times daily. 180 tablet 1  . Garlic 1000  MG CAPS Take 1,000 mg by mouth daily.    . hydrALAZINE (APRESOLINE) 50 MG tablet Take 1.5 tablets (75 mg total) by mouth 3 (three) times daily. 405 tablet 3  . hydrochlorothiazide (HYDRODIURIL) 25 MG tablet Take 1 tablet (25 mg total) by mouth daily. 90 tablet 3  . metFORMIN (GLUCOPHAGE) 500 MG tablet Take 1 tablet (500 mg total) by mouth daily. 90 tablet 1  . metoprolol (LOPRESSOR) 50 MG tablet Take 1 tablet (50 mg total) by mouth 2 (two) times daily. 180 tablet 3  . nystatin (MYCOSTATIN/NYSTOP) 100000 UNIT/GM POWD APPLY TO ABDOMEN FOLDS AND UNDER BREAST 2-3 TIMES DAILY 60 g 5  . omega-3 acid ethyl esters (LOVAZA) 1 G capsule Take 2 g by mouth 2 (two) times daily.    . ondansetron (ZOFRAN) 4 MG tablet Take 1 tablet (4 mg total) by mouth every 6 (six) hours. 12 tablet  0  . sucralfate (CARAFATE) 1 GM/10ML suspension Take 10 mLs (1 g total) by mouth 4 (four) times daily -  with meals and at bedtime. 420 mL 0  . vitamin E 400 UNIT capsule Take 400 Units by mouth daily.     No current facility-administered medications on file prior to visit.    Past Medical History  Diagnosis Date  . Hypertension   . CHF (congestive heart failure) (HCC)   . Diabetes mellitus without complication (HCC)     No past surgical history on file.  Social History   Social History  . Marital Status: Divorced    Spouse Name: N/A  . Number of Children: N/A  . Years of Education: N/A   Social History Main Topics  . Smoking status: Former Games developer  . Smokeless tobacco: Not on file  . Alcohol Use: No  . Drug Use: No  . Sexual Activity: Not on file   Other Topics Concern  . Not on file   Social History Narrative    Family History  Problem Relation Age of Onset  . Heart disease Mother   . Alcohol abuse Father   . Alcohol abuse Sister   . Diabetes Sister   . Hypertension Sister   . Cancer Sister   . Alcohol abuse Brother   . Diabetes Brother   . Hypertension Brother     Review of Systems  Constitutional: Positive for appetite change (low). Negative for fever and fatigue.  HENT: Positive for rhinorrhea.   Respiratory: Positive for cough (dry) and shortness of breath (one episdoe since the ED). Negative for wheezing.   Cardiovascular: Positive for palpitations. Negative for chest pain and leg swelling.  Gastrointestinal: Positive for nausea, abdominal pain (epigrastic region - occasional, mild) and constipation. Negative for vomiting, diarrhea and blood in stool (no melana).       Increased belching, No GERD  Genitourinary: Positive for vaginal discharge.  Musculoskeletal: Positive for gait problem (unsteady when walking for about one month or so).  Neurological: Positive for light-headedness (sometimes) and headaches (on left side of head only - intermittent).  Negative for dizziness.       Objective:   Filed Vitals:   08/30/15 1450  BP: 106/70  Pulse: 54  Temp: 98.1 F (36.7 C)  Resp: 16   Filed Weights   08/30/15 1450  Weight: 200 lb (90.719 kg)   Body mass index is 27.91 kg/(m^2).   Physical Exam Constitutional: Appears well-developed and well-nourished. No distress.  Neck: Neck supple. No tracheal deviation present. No thyromegaly present.  No carotid bruit. No cervical  adenopathy.   Cardiovascular: Normal rate, regular rhythm and normal heart sounds.   No murmur heard.  No edema Pulmonary/Chest: Effort normal and breath sounds normal. No respiratory distress. No wheezes.  Abdomen: soft,non-tender, non-distended, no masses Psych: normal mood and affect       Assessment & Plan:   See Problem List for Assessment and Plan of chronic medical problems.

## 2015-09-19 ENCOUNTER — Encounter: Payer: Self-pay | Admitting: Internal Medicine

## 2015-10-04 ENCOUNTER — Ambulatory Visit: Payer: Medicare Other | Admitting: Internal Medicine

## 2015-11-01 ENCOUNTER — Other Ambulatory Visit: Payer: Self-pay | Admitting: Internal Medicine

## 2015-11-14 ENCOUNTER — Ambulatory Visit (INDEPENDENT_AMBULATORY_CARE_PROVIDER_SITE_OTHER): Payer: Medicare Other | Admitting: Internal Medicine

## 2015-11-14 ENCOUNTER — Encounter: Payer: Self-pay | Admitting: Internal Medicine

## 2015-11-14 VITALS — BP 122/70 | HR 58 | Temp 98.0°F | Resp 18 | Wt 193.0 lb

## 2015-11-14 DIAGNOSIS — K5900 Constipation, unspecified: Secondary | ICD-10-CM | POA: Insufficient documentation

## 2015-11-14 DIAGNOSIS — Z23 Encounter for immunization: Secondary | ICD-10-CM | POA: Diagnosis not present

## 2015-11-14 DIAGNOSIS — G478 Other sleep disorders: Secondary | ICD-10-CM

## 2015-11-14 DIAGNOSIS — M542 Cervicalgia: Secondary | ICD-10-CM | POA: Insufficient documentation

## 2015-11-14 NOTE — Assessment & Plan Note (Signed)
Muscle tightness in right back at base of neck Likely causing a pinched nerve transiently Will try heat and stretching Deferred PT Discussed possible muscle relaxer, but will try to avoid given age Call if no improvement

## 2015-11-14 NOTE — Progress Notes (Signed)
Patient received education resource, including the self-management goal and tool. Patient verbalized understanding. 

## 2015-11-14 NOTE — Assessment & Plan Note (Signed)
Discussed otc medication and natural supplements Avoid prescription medication Work on sleep hygiene - will try reading in middle of hte night and return to bed when tired

## 2015-11-14 NOTE — Assessment & Plan Note (Signed)
Discussed natural fiber Prunes Probiotics Supplemental fiber Stool softener miralax

## 2015-11-14 NOTE — Patient Instructions (Signed)
  Try heat for your upper back and neck muscle tightness  Flu vaccine administered today.   Medications reviewed and updated.  No changes recommended at this time.

## 2015-11-14 NOTE — Progress Notes (Signed)
Subjective:    Patient ID: Susan Brady, female    DOB: Apr 11, 1945, 70 y.o.   MRN: [id number]  HPI She is here for an acute visit.   Three weeks ago she started having pain from the right side of her neck going up to her head. She denies pain radiating pain in to the arm, back or chest.  She denies headaches.  She has some lightheadedness intermittently, which is also new.  It often occurs when sitting down.  She has tried tylenol for the next pain.  The pain is intermittent and is sharp.  It will last a few seconds and occurs several times a day .  She has some muscle tightness in her right upper back near her neck.   She can hear her heartbeat in her left ear when she is laying down.  She denies change in her hearing or ear pain.   Constipation:  She has been constipated.  She is unsure what she should take.  She drinks plenty of water.    Goes to bed at 10:30-11pm and wakes up around 2am.  Falls back asleep at 4am and gets out of bed at 7:30 -8am.  She is unsure if there is something she can take.   Medications and allergies reviewed with patient and updated if appropriate.  Patient Active Problem List   Diagnosis Date Noted  . Hiatal hernia, large 08/30/2015  . Gastric ulcer 08/30/2015  . Anorexia 08/30/2015  . Unsteadiness 08/30/2015  . Gastritis 08/15/2015  . Left shoulder pain 06/22/2015  . Essential hypertension 11/22/2014  . Diabetes mellitus (HCC) 11/22/2014  . Arthritis 11/22/2014  . Glaucoma 11/22/2014  . GERD (gastroesophageal reflux disease) 11/22/2014  . Varicose veins 11/22/2014  . Heart disease 11/22/2014  . Myocardial infarct, old 11/22/2014  . Superficial thrombophlebitis of lower extremity 11/22/2014    Current Outpatient Prescriptions on File Prior to Visit  Medication Sig Dispense Refill  . ACCU-CHEK AVIVA PLUS test strip TEST QD  5  . aspirin EC 81 MG tablet Take 1 tablet (81 mg total) by mouth daily.    Marland Kitchen atorvastatin (LIPITOR) 20 MG tablet TAKE 1  TABLET(20 MG) BY MOUTH DAILY 90 tablet 3  . cholecalciferol (VITAMIN D) 1000 UNITS tablet Take 1,000 Units by mouth once a week.     . furosemide (LASIX) 20 MG tablet TAKE 1 TABLET(20 MG) BY MOUTH TWICE DAILY 180 tablet 2  . Garlic 1000 MG CAPS Take 1,000 mg by mouth daily.    . hydrALAZINE (APRESOLINE) 50 MG tablet Take 1.5 tablets (75 mg total) by mouth 3 (three) times daily. 405 tablet 3  . hydrochlorothiazide (HYDRODIURIL) 25 MG tablet Take 1 tablet (25 mg total) by mouth daily. 90 tablet 3  . metFORMIN (GLUCOPHAGE) 500 MG tablet Take 1 tablet (500 mg total) by mouth daily. 90 tablet 1  . metoprolol (LOPRESSOR) 50 MG tablet Take 1 tablet (50 mg total) by mouth 2 (two) times daily. 180 tablet 3  . nystatin (MYCOSTATIN/NYSTOP) 100000 UNIT/GM POWD APPLY TO ABDOMEN FOLDS AND UNDER BREAST 2-3 TIMES DAILY 60 g 5  . omega-3 acid ethyl esters (LOVAZA) 1 G capsule Take 2 g by mouth 2 (two) times daily.    Marland Kitchen omeprazole (PRILOSEC) 20 MG capsule Take 1 capsule (20 mg total) by mouth daily. 30 capsule 3  . ondansetron (ZOFRAN) 4 MG tablet Take 1 tablet (4 mg total) by mouth every 6 (six) hours. 12 tablet 0  . sucralfate (CARAFATE) 1  GM/10ML suspension Take 10 mLs (1 g total) by mouth 4 (four) times daily -  with meals and at bedtime. 420 mL 0  . vitamin E 400 UNIT capsule Take 400 Units by mouth daily.     No current facility-administered medications on file prior to visit.     Past Medical History:  Diagnosis Date  . CHF (congestive heart failure) (HCC)   . Diabetes mellitus without complication (HCC)   . Hypertension     No past surgical history on file.  Social History   Social History  . Marital status: Divorced    Spouse name: N/A  . Number of children: N/A  . Years of education: N/A   Social History Main Topics  . Smoking status: Former Games developermoker  . Smokeless tobacco: Not on file  . Alcohol use No  . Drug use: No  . Sexual activity: Not on file   Other Topics Concern  . Not on  file   Social History Narrative  . No narrative on file    Family History  Problem Relation Age of Onset  . Heart disease Mother   . Alcohol abuse Father   . Alcohol abuse Sister   . Diabetes Sister   . Hypertension Sister   . Cancer Sister   . Alcohol abuse Brother   . Diabetes Brother   . Hypertension Brother     Review of Systems     Objective:   Vitals:   11/14/15 1433  BP: 122/70  Pulse: (!) 58  Resp: 18  Temp: 98 F (36.7 C)   Filed Weights   11/14/15 1433  Weight: 193 lb (87.5 kg)   Body mass index is 26.92 kg/m.   Physical Exam  Constitutional: She appears well-developed and well-nourished. No distress.  HENT:  Head: Normocephalic and atraumatic.  Right Ear: External ear normal.  Left Ear: External ear normal.  B/l ear canals and TM normal  Eyes: Conjunctivae are normal.  Neck: Normal range of motion. Neck supple. No tracheal deviation present. No thyromegaly present.  Cardiovascular: Normal rate, regular rhythm and normal heart sounds.   No murmur heard. Pulmonary/Chest: Effort normal and breath sounds normal. No respiratory distress. She has no wheezes. She has no rales.  Musculoskeletal: She exhibits no edema.  Tenderness in right upper back near base of neck, no neck tenderness, no spine tenderness  Lymphadenopathy:    She has no cervical adenopathy.  Neurological: No cranial nerve deficit.  Normal sensation and strength in UE b/l  Skin: Skin is warm and dry. No rash noted. She is not diaphoretic. No erythema.          Assessment & Plan:   Hearing heartbeat in ear Exam is normal Only occurs when she lays down reassured Will follow up in November at her next appt   See Problem List for Assessment and Plan of chronic medical problems.   Flu vaccine

## 2015-11-17 DIAGNOSIS — R1013 Epigastric pain: Secondary | ICD-10-CM | POA: Diagnosis not present

## 2015-11-17 DIAGNOSIS — Z1211 Encounter for screening for malignant neoplasm of colon: Secondary | ICD-10-CM | POA: Diagnosis not present

## 2015-11-17 DIAGNOSIS — K259 Gastric ulcer, unspecified as acute or chronic, without hemorrhage or perforation: Secondary | ICD-10-CM | POA: Diagnosis not present

## 2015-11-25 ENCOUNTER — Other Ambulatory Visit: Payer: Self-pay | Admitting: Internal Medicine

## 2015-12-05 DIAGNOSIS — K2970 Gastritis, unspecified, without bleeding: Secondary | ICD-10-CM | POA: Diagnosis not present

## 2015-12-05 DIAGNOSIS — Z8711 Personal history of peptic ulcer disease: Secondary | ICD-10-CM | POA: Diagnosis not present

## 2015-12-05 DIAGNOSIS — K3189 Other diseases of stomach and duodenum: Secondary | ICD-10-CM | POA: Diagnosis not present

## 2015-12-05 DIAGNOSIS — K253 Acute gastric ulcer without hemorrhage or perforation: Secondary | ICD-10-CM | POA: Diagnosis not present

## 2015-12-05 DIAGNOSIS — K449 Diaphragmatic hernia without obstruction or gangrene: Secondary | ICD-10-CM | POA: Diagnosis not present

## 2015-12-26 ENCOUNTER — Other Ambulatory Visit (INDEPENDENT_AMBULATORY_CARE_PROVIDER_SITE_OTHER): Payer: Medicare Other

## 2015-12-26 ENCOUNTER — Ambulatory Visit (INDEPENDENT_AMBULATORY_CARE_PROVIDER_SITE_OTHER): Payer: Medicare Other | Admitting: Internal Medicine

## 2015-12-26 ENCOUNTER — Encounter: Payer: Self-pay | Admitting: Internal Medicine

## 2015-12-26 VITALS — BP 112/68 | HR 57 | Temp 98.1°F | Resp 16 | Ht 66.0 in | Wt 192.0 lb

## 2015-12-26 DIAGNOSIS — K219 Gastro-esophageal reflux disease without esophagitis: Secondary | ICD-10-CM

## 2015-12-26 DIAGNOSIS — E119 Type 2 diabetes mellitus without complications: Secondary | ICD-10-CM

## 2015-12-26 DIAGNOSIS — I519 Heart disease, unspecified: Secondary | ICD-10-CM

## 2015-12-26 DIAGNOSIS — I10 Essential (primary) hypertension: Secondary | ICD-10-CM

## 2015-12-26 LAB — COMPREHENSIVE METABOLIC PANEL
ALBUMIN: 4.6 g/dL (ref 3.5–5.2)
ALK PHOS: 42 U/L (ref 39–117)
ALT: 9 U/L (ref 0–35)
AST: 14 U/L (ref 0–37)
BILIRUBIN TOTAL: 0.5 mg/dL (ref 0.2–1.2)
BUN: 11 mg/dL (ref 6–23)
CALCIUM: 10 mg/dL (ref 8.4–10.5)
CO2: 34 mEq/L — ABNORMAL HIGH (ref 19–32)
Chloride: 102 mEq/L (ref 96–112)
Creatinine, Ser: 0.71 mg/dL (ref 0.40–1.20)
GFR: 104.3 mL/min (ref >60.00)
GLUCOSE: 99 mg/dL (ref 70–99)
POTASSIUM: 3.7 meq/L (ref 3.5–5.1)
Sodium: 142 mEq/L (ref 135–145)
TOTAL PROTEIN: 7.6 g/dL (ref 6.0–8.3)

## 2015-12-26 LAB — HEMOGLOBIN A1C: HEMOGLOBIN A1C: 5.9 % (ref 4.6–6.5)

## 2015-12-26 LAB — LIPID PANEL
CHOLESTEROL: 150 mg/dL (ref 0–200)
HDL: 67.9 mg/dL (ref >39.00)
LDL Cholesterol: 65 mg/dL (ref 0–99)
NonHDL: 81.69
TRIGLYCERIDES: 83 mg/dL (ref 0.0–149.0)
Total CHOL/HDL Ratio: 2
VLDL: 16.6 mg/dL (ref 0.0–40.0)

## 2015-12-26 LAB — MICROALBUMIN / CREATININE URINE RATIO
CREATININE, U: 33.8 mg/dL
MICROALB/CREAT RATIO: 2.1 mg/g (ref 0.0–30.0)
Microalb, Ur: <0.7 mg/dL (ref 0.0–1.9)

## 2015-12-26 NOTE — Progress Notes (Signed)
Pre visit review using our clinic review tool, if applicable. No additional management support is needed unless otherwise documented below in the visit note. 

## 2015-12-26 NOTE — Progress Notes (Signed)
Subjective:    Patient ID: Susan Brady, female    DOB: 14-Jan-1946, 70 y.o.   MRN: [id number]  HPI The patient is here for follow up.  CAD, Hypertension: She is taking her medication daily. She is compliant with a low sodium diet.  She denies chest pain, edema, shortness of breath and regular headaches. She is exercising regularly.  She does not monitor her blood pressure at home.    Diabetes: She is taking her medication daily as prescribed. She is compliant with a diabetic diet. She is exercising regularly. She monitors her sugars and they have been running 100 this morning, often in 80's. She denies symptomatic hypoglycemia.  She does not check her feet daily.  She is not up-to-date with an ophthalmology examination.   Hyperlipidemia: She is taking her medication daily. She is compliant with a low fat/cholesterol diet. She is exercising regularly. She denies myalgias.   GERD:  She is taking her medication daily as prescribed.  She denies any GERD symptoms and feels her GERD is well controlled. She has been with a GERD diet.    Medications and allergies reviewed with patient and updated if appropriate.  Patient Active Problem List   Diagnosis Date Noted  . Neck pain on right side 11/14/2015  . Poor sleep pattern 11/14/2015  . Constipation 11/14/2015  . Hiatal hernia, large 08/30/2015  . Gastric ulcer 08/30/2015  . Anorexia 08/30/2015  . Unsteadiness 08/30/2015  . Gastritis 08/15/2015  . Left shoulder pain 06/22/2015  . Essential hypertension 11/22/2014  . Diabetes mellitus (HCC) 11/22/2014  . Arthritis 11/22/2014  . Glaucoma 11/22/2014  . GERD (gastroesophageal reflux disease) 11/22/2014  . Varicose veins 11/22/2014  . Heart disease 11/22/2014  . Myocardial infarct, old 11/22/2014  . Superficial thrombophlebitis of lower extremity 11/22/2014    Current Outpatient Prescriptions on File Prior to Visit  Medication Sig Dispense Refill  . ACCU-CHEK AVIVA PLUS test strip  TEST QD  5  . aspirin EC 81 MG tablet Take 1 tablet (81 mg total) by mouth daily.    Marland Kitchen. atorvastatin (LIPITOR) 20 MG tablet TAKE 1 TABLET(20 MG) BY MOUTH DAILY 90 tablet 3  . cholecalciferol (VITAMIN D) 1000 UNITS tablet Take 1,000 Units by mouth once a week.     . furosemide (LASIX) 20 MG tablet TAKE 1 TABLET(20 MG) BY MOUTH TWICE DAILY 180 tablet 2  . Garlic 1000 MG CAPS Take 1,000 mg by mouth daily.    . hydrALAZINE (APRESOLINE) 50 MG tablet Take 1.5 tablets (75 mg total) by mouth 3 (three) times daily. 405 tablet 3  . hydrochlorothiazide (HYDRODIURIL) 25 MG tablet Take 1 tablet (25 mg total) by mouth daily. 90 tablet 3  . metFORMIN (GLUCOPHAGE) 500 MG tablet TAKE 1 TABLET(500 MG) BY MOUTH DAILY 90 tablet 1  . metoprolol (LOPRESSOR) 50 MG tablet Take 1 tablet (50 mg total) by mouth 2 (two) times daily. 180 tablet 3  . nystatin (MYCOSTATIN/NYSTOP) 100000 UNIT/GM POWD APPLY TO ABDOMEN FOLDS AND UNDER BREAST 2-3 TIMES DAILY 60 g 5  . omega-3 acid ethyl esters (LOVAZA) 1 G capsule Take 2 g by mouth 2 (two) times daily.    Marland Kitchen. omeprazole (PRILOSEC) 20 MG capsule Take 1 capsule (20 mg total) by mouth daily. 30 capsule 3  . vitamin E 400 UNIT capsule Take 400 Units by mouth daily.     No current facility-administered medications on file prior to visit.     Past Medical History:  Diagnosis Date  .  CHF (congestive heart failure) (HCC)   . Diabetes mellitus without complication (HCC)   . Hypertension     No past surgical history on file.  Social History   Social History  . Marital status: Divorced    Spouse name: N/A  . Number of children: N/A  . Years of education: N/A   Social History Main Topics  . Smoking status: Former Games developermoker  . Smokeless tobacco: None  . Alcohol use No  . Drug use: No  . Sexual activity: Not Asked   Other Topics Concern  . None   Social History Narrative  . None    Family History  Problem Relation Age of Onset  . Heart disease Mother   . Alcohol abuse  Father   . Alcohol abuse Sister   . Diabetes Sister   . Hypertension Sister   . Cancer Sister   . Alcohol abuse Brother   . Diabetes Brother   . Hypertension Brother     Review of Systems  Constitutional: Negative for fever.  Respiratory: Negative for cough, shortness of breath and wheezing.   Cardiovascular: Positive for palpitations (occ). Negative for chest pain and leg swelling.  Gastrointestinal: Negative for abdominal pain, blood in stool (no black stool) and nausea.       GERD controlled  Neurological: Positive for headaches (occ). Negative for dizziness and light-headedness.       Objective:   Vitals:   12/26/15 1048  BP: 112/68  Pulse: (!) 57  Resp: 16  Temp: 98.1 F (36.7 C)   Filed Weights   12/26/15 1048  Weight: 192 lb (87.1 kg)   Body mass index is 30.99 kg/m.   Physical Exam    Constitutional: Appears well-developed and well-nourished. No distress.  HENT:  Head: Normocephalic and atraumatic.  Neck: Neck supple. No tracheal deviation present. No thyromegaly present.  No cervical lymphadenopathy Cardiovascular: Normal rate, regular rhythm and normal heart sounds.   No murmur heard. No carotid bruit .  No edema Pulmonary/Chest: Effort normal and breath sounds normal. No respiratory distress. No has no wheezes. No rales.  Skin: Skin is warm and dry. Not diaphoretic.  Psychiatric: Normal mood and affect. Behavior is normal.      Assessment & Plan:    See Problem List for Assessment and Plan of chronic medical problems.

## 2015-12-26 NOTE — Assessment & Plan Note (Signed)
GERD controlled Continue daily medication Continue GERD diet

## 2015-12-26 NOTE — Assessment & Plan Note (Signed)
No chest pain, SOB Continue current medications - ASA, statin, metoprolol

## 2015-12-26 NOTE — Patient Instructions (Addendum)

## 2015-12-26 NOTE — Assessment & Plan Note (Signed)
BP well controlled Current regimen effective and well tolerated Continue current medications at current doses cmp  

## 2015-12-26 NOTE — Assessment & Plan Note (Signed)
Sugars well controlled Call if she has sugar below 80 We may be able to discontinue metformin at her next visit - if a1c is stable will consider discontinuing it Will make an eye appt Discussed checking her feet daily

## 2015-12-27 ENCOUNTER — Other Ambulatory Visit: Payer: Self-pay | Admitting: Internal Medicine

## 2015-12-27 DIAGNOSIS — E119 Type 2 diabetes mellitus without complications: Secondary | ICD-10-CM

## 2015-12-31 ENCOUNTER — Other Ambulatory Visit: Payer: Self-pay | Admitting: Internal Medicine

## 2016-04-02 ENCOUNTER — Other Ambulatory Visit: Payer: Self-pay | Admitting: Internal Medicine

## 2016-04-05 ENCOUNTER — Other Ambulatory Visit: Payer: Self-pay | Admitting: *Deleted

## 2016-04-05 MED ORDER — NYSTATIN 100000 UNIT/GM EX POWD
CUTANEOUS | 5 refills | Status: DC
Start: 2016-04-05 — End: 2019-03-03

## 2016-05-27 ENCOUNTER — Other Ambulatory Visit: Payer: Self-pay | Admitting: Internal Medicine

## 2016-06-04 ENCOUNTER — Other Ambulatory Visit: Payer: Self-pay | Admitting: Internal Medicine

## 2016-06-25 ENCOUNTER — Encounter: Payer: Self-pay | Admitting: Internal Medicine

## 2016-06-25 ENCOUNTER — Other Ambulatory Visit (INDEPENDENT_AMBULATORY_CARE_PROVIDER_SITE_OTHER): Payer: Medicare Other

## 2016-06-25 ENCOUNTER — Ambulatory Visit (INDEPENDENT_AMBULATORY_CARE_PROVIDER_SITE_OTHER): Payer: Medicare Other | Admitting: Internal Medicine

## 2016-06-25 VITALS — BP 130/64 | HR 67 | Temp 98.3°F | Resp 16 | Ht 66.0 in | Wt 212.0 lb

## 2016-06-25 DIAGNOSIS — I10 Essential (primary) hypertension: Secondary | ICD-10-CM

## 2016-06-25 DIAGNOSIS — E119 Type 2 diabetes mellitus without complications: Secondary | ICD-10-CM

## 2016-06-25 DIAGNOSIS — K219 Gastro-esophageal reflux disease without esophagitis: Secondary | ICD-10-CM

## 2016-06-25 DIAGNOSIS — E785 Hyperlipidemia, unspecified: Secondary | ICD-10-CM | POA: Insufficient documentation

## 2016-06-25 DIAGNOSIS — I2510 Atherosclerotic heart disease of native coronary artery without angina pectoris: Secondary | ICD-10-CM | POA: Diagnosis not present

## 2016-06-25 DIAGNOSIS — E7800 Pure hypercholesterolemia, unspecified: Secondary | ICD-10-CM

## 2016-06-25 LAB — COMPREHENSIVE METABOLIC PANEL
ALT: 14 U/L (ref 0–35)
AST: 19 U/L (ref 0–37)
Albumin: 4.4 g/dL (ref 3.5–5.2)
Alkaline Phosphatase: 42 U/L (ref 39–117)
BUN: 13 mg/dL (ref 6–23)
CALCIUM: 9.6 mg/dL (ref 8.4–10.5)
CHLORIDE: 104 meq/L (ref 96–112)
CO2: 33 meq/L — AB (ref 19–32)
CREATININE: 0.66 mg/dL (ref 0.40–1.20)
GFR: 113.31 mL/min (ref >60.00)
GLUCOSE: 103 mg/dL — AB (ref 70–99)
Potassium: 3.7 mEq/L (ref 3.5–5.1)
Sodium: 142 mEq/L (ref 135–145)
Total Bilirubin: 0.5 mg/dL (ref 0.2–1.2)
Total Protein: 7.2 g/dL (ref 6.0–8.3)

## 2016-06-25 LAB — HEMOGLOBIN A1C: HEMOGLOBIN A1C: 6.2 % (ref 4.6–6.5)

## 2016-06-25 NOTE — Progress Notes (Signed)
Subjective:    Patient ID: Susan Brady, female    DOB: [birth year], 71 y.o.   MRN: [id number]  HPI The patient is here for follow up.  CAD, Hypertension: She is taking her medication daily. She is compliant with a low sodium diet.  She denies chest pain.  She is exercising  - walking a little bit - less walking than was doing previously.      Diabetes: She is controlling her sugars with diet. She has been less compliant with a diabetic diet and overall has been eating more.  She has gained weight. She is exercising - walking some. She has not checked her sugars recently.   Hyperlipidemia: She is taking her medication daily. She is fairly compliant with a low fat/cholesterol diet. She is exercising  - walking a little. She denies myalgias.   GERD:  She is taking her medication daily as prescribed.  She denies any GERD symptoms and feels her GERD is well controlled.    Medications and allergies reviewed with patient and updated if appropriate.  Patient Active Problem List   Diagnosis Date Noted  . CAD (coronary artery disease) 06/25/2016  . Hyperlipidemia 06/25/2016  . Neck pain on right side 11/14/2015  . Poor sleep pattern 11/14/2015  . Constipation 11/14/2015  . Hiatal hernia, large 08/30/2015  . Gastric ulcer 08/30/2015  . Anorexia 08/30/2015  . Unsteadiness 08/30/2015  . Gastritis 08/15/2015  . Left shoulder pain 06/22/2015  . Essential hypertension 11/22/2014  . Diabetes mellitus (HCC) 11/22/2014  . Arthritis 11/22/2014  . Glaucoma 11/22/2014  . GERD (gastroesophageal reflux disease) 11/22/2014  . Varicose veins 11/22/2014  . Heart disease 11/22/2014  . Myocardial infarct, old 11/22/2014  . Superficial thrombophlebitis of lower extremity 11/22/2014    Current Outpatient Prescriptions on File Prior to Visit  Medication Sig Dispense Refill  . ACCU-CHEK AVIVA PLUS test strip TEST QD  5  . aspirin EC 81 MG tablet Take 1 tablet (81 mg total) by mouth daily.    Marland Kitchen  atorvastatin (LIPITOR) 20 MG tablet TAKE 1 TABLET(20 MG) BY MOUTH DAILY 90 tablet 1  . cholecalciferol (VITAMIN D) 1000 UNITS tablet Take 1,000 Units by mouth once a week.     . furosemide (LASIX) 20 MG tablet TAKE 1 TABLET(20 MG) BY MOUTH TWICE DAILY 180 tablet 2  . Garlic 1000 MG CAPS Take 1,000 mg by mouth daily.    . hydrALAZINE (APRESOLINE) 50 MG tablet TAKE 1 AND 1/2 TABLETS BY MOUTH THREE TIMES DAILY 405 tablet 1  . hydrochlorothiazide (HYDRODIURIL) 25 MG tablet TAKE 1 TABLET(25 MG) BY MOUTH DAILY 90 tablet 3  . metoprolol (LOPRESSOR) 50 MG tablet TAKE 1 TABLET(50 MG) BY MOUTH TWICE DAILY 180 tablet 0  . nystatin (NYSTATIN) powder APPLY TO ABDOMEN FOLDS AND UNDER BREAST 2-3 TIMES DAILY 60 g 5  . omega-3 acid ethyl esters (LOVAZA) 1 G capsule Take 2 g by mouth 2 (two) times daily.    Marland Kitchen omeprazole (PRILOSEC) 20 MG capsule Take 1 capsule (20 mg total) by mouth daily. 30 capsule 3  . vitamin E 400 UNIT capsule Take 400 Units by mouth daily.     No current facility-administered medications on file prior to visit.     Past Medical History:  Diagnosis Date  . CHF (congestive heart failure) (HCC)   . Diabetes mellitus without complication (HCC)   . Hypertension     No past surgical history on file.  Social History   Social History  .  Marital status: Divorced    Spouse name: N/A  . Number of children: N/A  . Years of education: N/A   Social History Main Topics  . Smoking status: Former Games developermoker  . Smokeless tobacco: Not on file  . Alcohol use No  . Drug use: No  . Sexual activity: Not on file   Other Topics Concern  . Not on file   Social History Narrative  . No narrative on file    Family History  Problem Relation Age of Onset  . Heart disease Mother   . Alcohol abuse Father   . Alcohol abuse Sister   . Diabetes Sister   . Hypertension Sister   . Cancer Sister   . Alcohol abuse Brother   . Diabetes Brother   . Hypertension Brother     Review of Systems    Constitutional: Negative for chills and fever.  Respiratory: Positive for cough (sometimes) and shortness of breath (stairs, household activities). Negative for wheezing.   Cardiovascular: Positive for palpitations (3 times a week, mostly when she lays down) and leg swelling (occ RLE edema  - varicose veins). Negative for chest pain.  Gastrointestinal: Positive for nausea (occasional). Negative for abdominal pain.       No gerd  Neurological: Positive for light-headedness (when gets up) and headaches (rare).       Objective:   Vitals:   06/25/16 1012  BP: 130/64  Pulse: 67  Resp: 16  Temp: 98.3 F (36.8 C)   Wt Readings from Last 3 Encounters:  06/25/16 212 lb (96.2 kg)  12/26/15 192 lb (87.1 kg)  11/14/15 193 lb (87.5 kg)   Body mass index is 34.22 kg/m.   Physical Exam    Constitutional: Appears well-developed and well-nourished. No distress.  HENT:  Head: Normocephalic and atraumatic.  Neck: Neck supple. No tracheal deviation present. No thyromegaly present.  No cervical lymphadenopathy Cardiovascular: Normal rate, regular rhythm and normal heart sounds.   No murmur heard. No carotid bruit .  No edema Pulmonary/Chest: Effort normal and breath sounds normal. No respiratory distress. No has no wheezes. No rales.  Skin: Skin is warm and dry. Not diaphoretic.  Psychiatric: Normal mood and affect. Behavior is normal.      Assessment & Plan:    See Problem List for Assessment and Plan of chronic medical problems.

## 2016-06-25 NOTE — Progress Notes (Signed)
Pre visit review using our clinic review tool, if applicable. No additional management support is needed unless otherwise documented below in the visit note. 

## 2016-06-25 NOTE — Assessment & Plan Note (Signed)
GERD controlled Continue daily medication  

## 2016-06-25 NOTE — Assessment & Plan Note (Signed)
BP well controlled Current regimen effective and well tolerated Continue current medications at current doses  

## 2016-06-25 NOTE — Patient Instructions (Addendum)
  Test(s) ordered today. Your results will be released to MyChart (or called to you) after review, usually within 72hours after test completion. If any changes need to be made, you will be notified at that same time.  Medications reviewed and updated.  No changes recommended at this time.    Please followup in 6 months   

## 2016-06-25 NOTE — Assessment & Plan Note (Addendum)
Metformin d/c last fall  Check a1c Low sugar / carb diet Stressed regular exercise, weight loss

## 2016-06-25 NOTE — Assessment & Plan Note (Signed)
Continue statin. 

## 2016-06-25 NOTE — Assessment & Plan Note (Signed)
Has some SOB with stairs and household activities, but not with walking.  She is not active enough and her SOB is likely related to deconditioning Increase activity, work on weight loss If SOB continues or worsens will need cardiology referral

## 2016-06-30 IMAGING — CT CT HEAD W/O CM
2 series · 16 of 30 positions shown, 20 images · non-contrast
Comparison: 11/26/2012

CLINICAL DATA: Left-sided headache for 2 days.

EXAM:
CT HEAD WITHOUT CONTRAST
TECHNIQUE: Contiguous axial images were obtained from the base of the skull
through the vertex without intravenous contrast.

[Series 201: head w/o, idose (1) · axial · non-contrast · 0.49mm/px · z∈[+80,+200]mm · 13 of 30 slices shown, 17 images]
[im 3/30  brain]
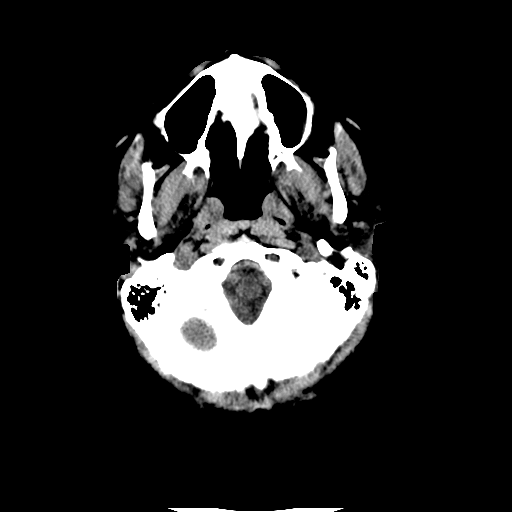
[im 3/30  bone]
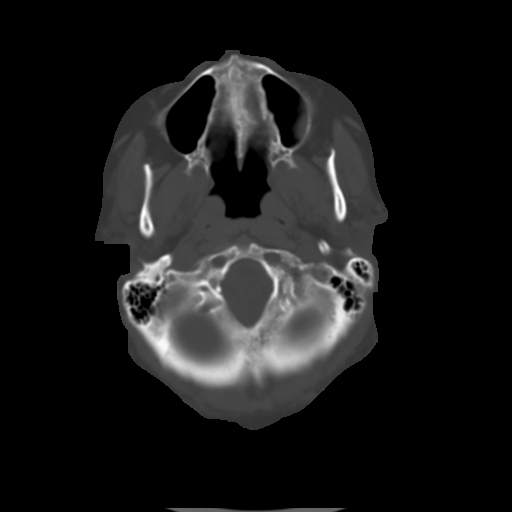
[im 5/30  brain]
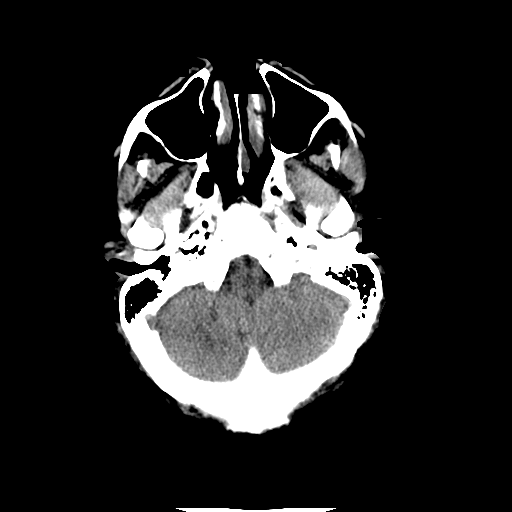
[im 7/30  brain]
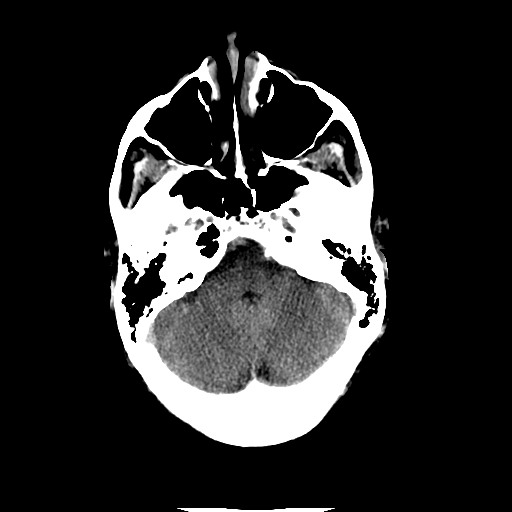
[im 9/30  brain]
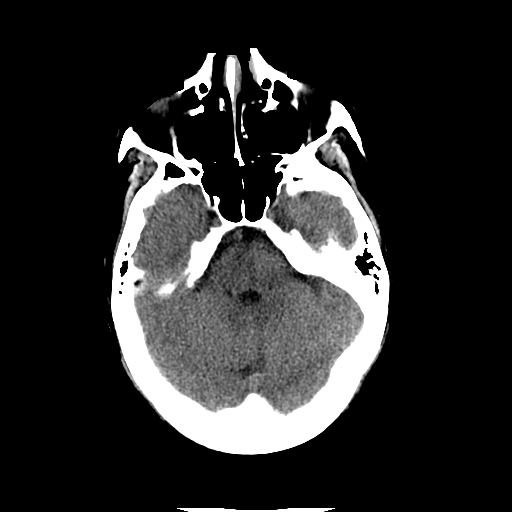
[im 11/30  brain]
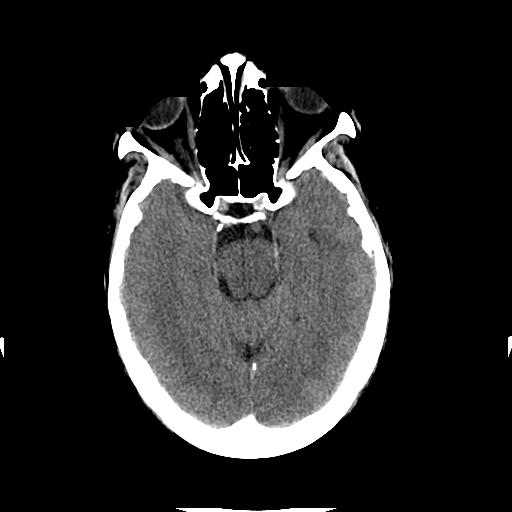
[im 11/30  bone]
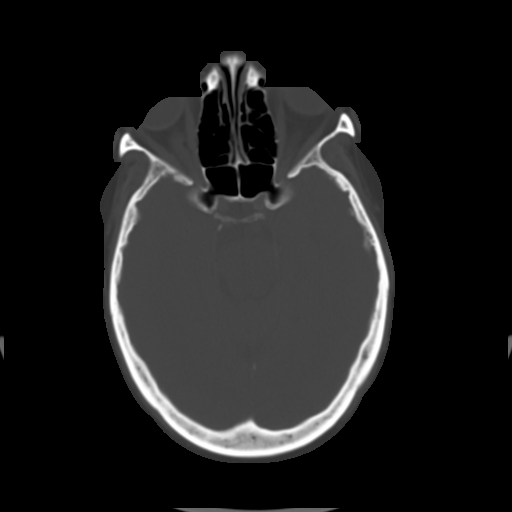
[im 13/30  brain]
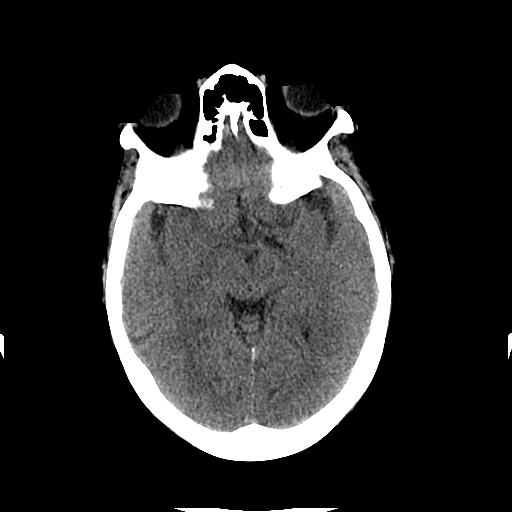
[im 15/30  brain]
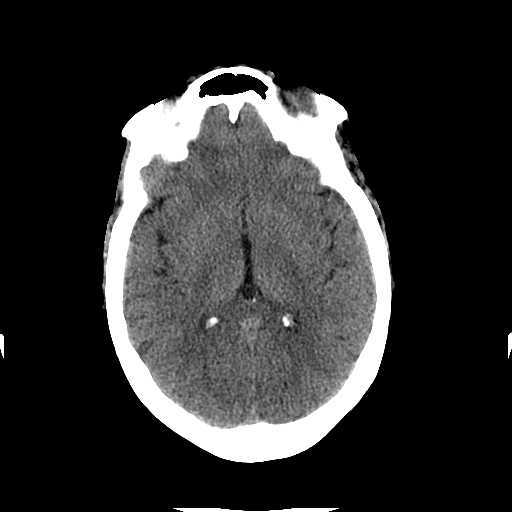
[im 17/30  brain]
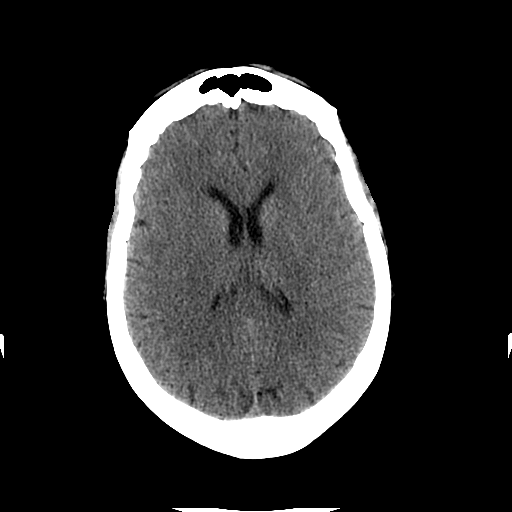
[im 19/30  brain]
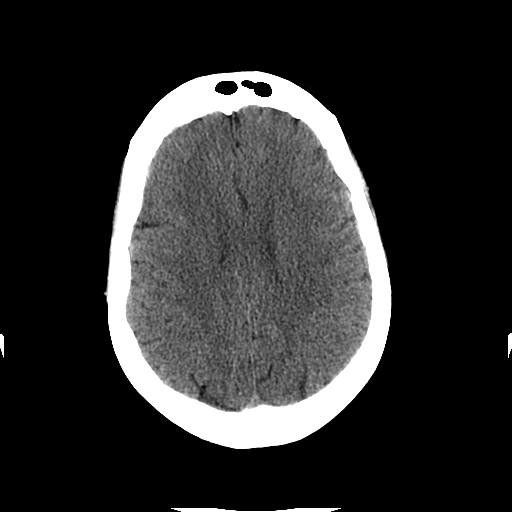
[im 19/30  bone]
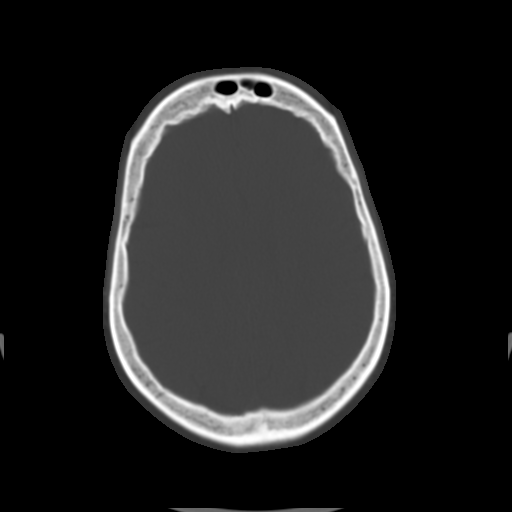
[im 21/30  brain]
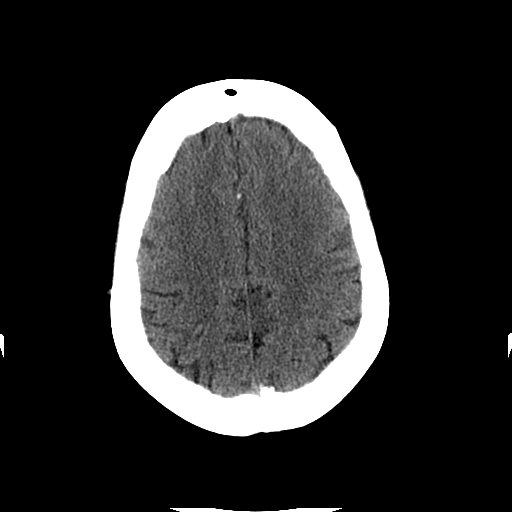
[im 23/30  brain]
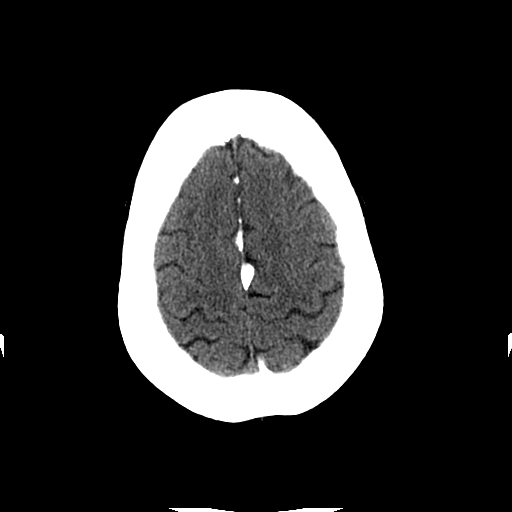
[im 25/30  brain]
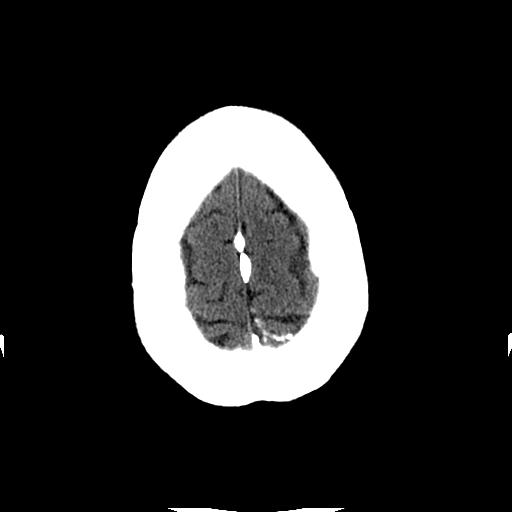
[im 27/30  brain]
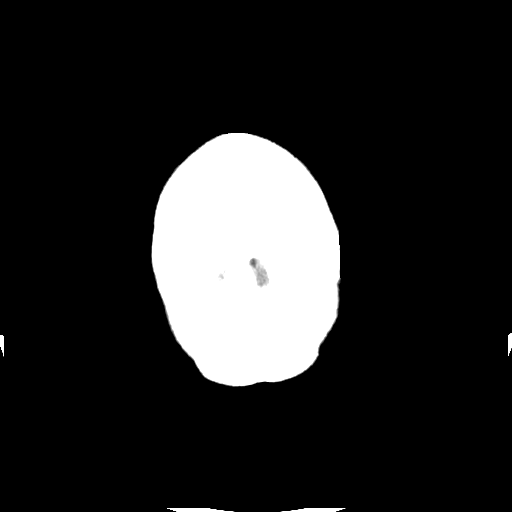
[im 27/30  bone]
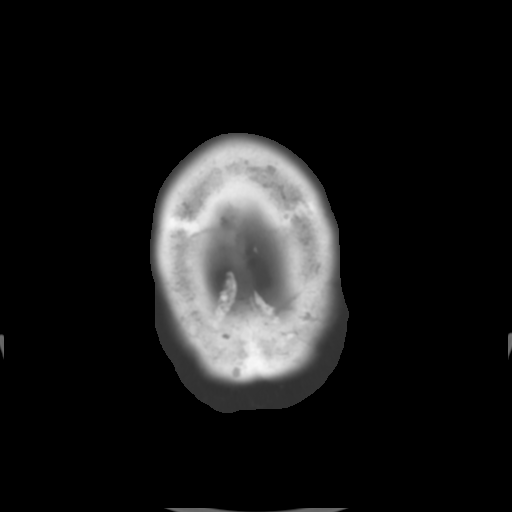

[Series 202: head w/o bone, idose (1) · axial · non-contrast · 0.49mm/px · z∈[+80,+120]mm · 3 of 30 slices shown]
[im 3/30  bone]
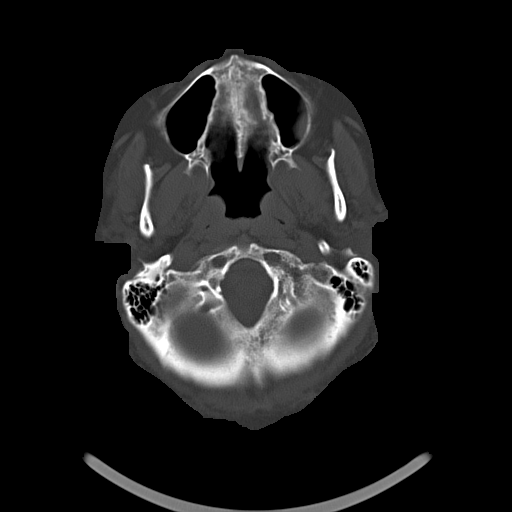
[im 7/30  bone]
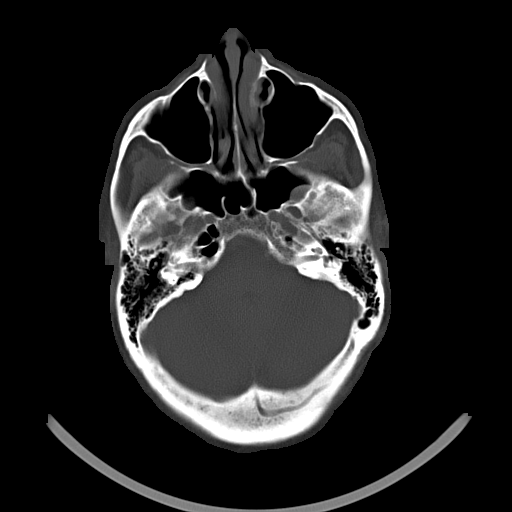
[im 11/30  bone]
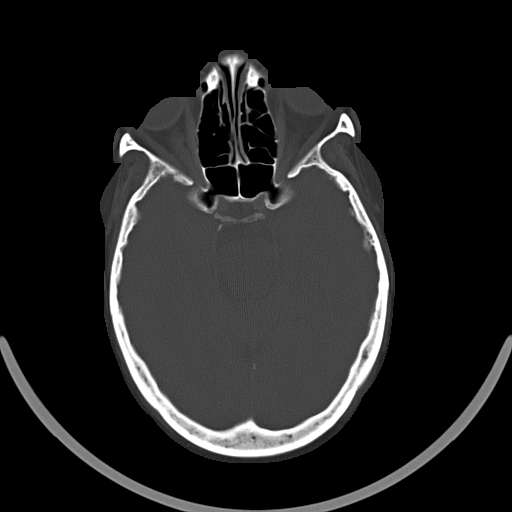

[16 of 30 positions shown; findings below may reference images not displayed]

FINDINGS: No intracranial hemorrhage, mass effect, or midline shift. No
hydrocephalus. The basilar cisterns are patent. No evidence of
territorial infarct. No intracranial fluid collection. Calvarium is
intact. Mild chronic mucosal thickening of left maxillary sinus,
remaining paranasal sinuses are well-aerated. The mastoid air cells
are well aerated.
IMPRESSION: 1.  No acute intracranial abnormality.
2. Mild chronic left maxillary sinus disease.

## 2016-07-19 ENCOUNTER — Encounter: Payer: Self-pay | Admitting: Internal Medicine

## 2016-07-19 ENCOUNTER — Ambulatory Visit (INDEPENDENT_AMBULATORY_CARE_PROVIDER_SITE_OTHER): Payer: Medicare Other | Admitting: Internal Medicine

## 2016-07-19 VITALS — BP 130/62 | HR 65 | Temp 98.5°F | Resp 16 | Wt 207.0 lb

## 2016-07-19 DIAGNOSIS — I2510 Atherosclerotic heart disease of native coronary artery without angina pectoris: Secondary | ICD-10-CM

## 2016-07-19 DIAGNOSIS — M720 Palmar fascial fibromatosis [Dupuytren]: Secondary | ICD-10-CM | POA: Diagnosis not present

## 2016-07-19 NOTE — Progress Notes (Signed)
Subjective:    Patient ID: Susan Brady, female    DOB: Mar 04, 1945, 71 y.o.   MRN: [id number]  HPI She is here for an acute visit for hand pain.  Right hand pain:  She has a couple of knots in the palm of her right hand - she noticed them about two weeks ago after hitting her hands against something and it hurt.  It sometimes hurts at rest.  The hand feels weaker.  She has mild numbness/tingling.  There is mild swelling.  She has intermittent numbness in her 2-4 fingers in the finger tips.  Occasionally her pain from the hand radiates up the forearm.   Tylenol has helped a little.    Medications and allergies reviewed with patient and updated if appropriate.  Patient Active Problem List   Diagnosis Date Noted  . CAD (coronary artery disease) 06/25/2016  . Hyperlipidemia 06/25/2016  . Neck pain on right side 11/14/2015  . Poor sleep pattern 11/14/2015  . Constipation 11/14/2015  . Hiatal hernia, large 08/30/2015  . Gastric ulcer 08/30/2015  . Anorexia 08/30/2015  . Unsteadiness 08/30/2015  . Gastritis 08/15/2015  . Left shoulder pain 06/22/2015  . Essential hypertension 11/22/2014  . Diabetes mellitus (HCC) 11/22/2014  . Arthritis 11/22/2014  . Glaucoma 11/22/2014  . GERD (gastroesophageal reflux disease) 11/22/2014  . Varicose veins 11/22/2014  . Heart disease 11/22/2014  . Myocardial infarct, old 11/22/2014  . Superficial thrombophlebitis of lower extremity 11/22/2014    Current Outpatient Prescriptions on File Prior to Visit  Medication Sig Dispense Refill  . ACCU-CHEK AVIVA PLUS test strip TEST QD  5  . aspirin EC 81 MG tablet Take 1 tablet (81 mg total) by mouth daily.    Marland Kitchen. atorvastatin (LIPITOR) 20 MG tablet TAKE 1 TABLET(20 MG) BY MOUTH DAILY 90 tablet 1  . cholecalciferol (VITAMIN D) 1000 UNITS tablet Take 1,000 Units by mouth once a week.     . furosemide (LASIX) 20 MG tablet TAKE 1 TABLET(20 MG) BY MOUTH TWICE DAILY 180 tablet 2  . Garlic 1000 MG CAPS Take  1,000 mg by mouth daily.    . hydrALAZINE (APRESOLINE) 50 MG tablet TAKE 1 AND 1/2 TABLETS BY MOUTH THREE TIMES DAILY 405 tablet 1  . hydrochlorothiazide (HYDRODIURIL) 25 MG tablet TAKE 1 TABLET(25 MG) BY MOUTH DAILY 90 tablet 3  . metoprolol (LOPRESSOR) 50 MG tablet TAKE 1 TABLET(50 MG) BY MOUTH TWICE DAILY 180 tablet 0  . nystatin (NYSTATIN) powder APPLY TO ABDOMEN FOLDS AND UNDER BREAST 2-3 TIMES DAILY 60 g 5  . omega-3 acid ethyl esters (LOVAZA) 1 G capsule Take 2 g by mouth 2 (two) times daily.    Marland Kitchen. omeprazole (PRILOSEC) 20 MG capsule Take 1 capsule (20 mg total) by mouth daily. 30 capsule 3  . vitamin E 400 UNIT capsule Take 400 Units by mouth daily.     No current facility-administered medications on file prior to visit.     Past Medical History:  Diagnosis Date  . CHF (congestive heart failure) (HCC)   . Diabetes mellitus without complication (HCC)   . Hypertension     No past surgical history on file.  Social History   Social History  . Marital status: Divorced    Spouse name: N/A  . Number of children: N/A  . Years of education: N/A   Social History Main Topics  . Smoking status: Former Games developermoker  . Smokeless tobacco: Never Used  . Alcohol use No  . Drug  use: No  . Sexual activity: Not on file   Other Topics Concern  . Not on file   Social History Narrative  . No narrative on file    Family History  Problem Relation Age of Onset  . Heart disease Mother   . Alcohol abuse Father   . Alcohol abuse Sister   . Diabetes Sister   . Hypertension Sister   . Cancer Sister   . Alcohol abuse Brother   . Diabetes Brother   . Hypertension Brother     Review of Systems  Constitutional: Negative for fever.  Skin: Negative for color change and rash.  Neurological: Positive for weakness and numbness.       Objective:   Vitals:   07/19/16 1613  BP: 130/62  Pulse: 65  Resp: 16  Temp: 98.5 F (36.9 C)   Filed Weights   07/19/16 1613  Weight: 207 lb (93.9  kg)   Body mass index is 33.41 kg/m.  Wt Readings from Last 3 Encounters:  07/19/16 207 lb (93.9 kg)  06/25/16 212 lb (96.2 kg)  12/26/15 192 lb (87.1 kg)     Physical Exam  Constitutional: She appears well-developed and well-nourished. No distress.  Cardiovascular: Intact distal pulses.   Musculoskeletal:  Palpable lumps along right third finger flexor tendon, tender to palpation and increase pain with flexion/extension of third finger  Neurological:  Right hand with normal sensation  Skin: Skin is warm and dry. No rash noted. She is not diaphoretic. No erythema.          Assessment & Plan:   See Problem List for Assessment and Plan of chronic medical problems.

## 2016-07-19 NOTE — Assessment & Plan Note (Signed)
Declined PT or ortho referral Continue tylenol - it is helping Hand exercises at home  Call if no improvement

## 2016-07-19 NOTE — Patient Instructions (Signed)
Continue taking tylenol as needed.  Doing hand exercises at home.  Let me know if you want to do physical therapy or see a hand specialist.     Dupuytren Contracture Dupuytren contracture is a condition in which tissue under the skin of the palm becomes abnormally thickened. This causes one or more of the fingers to curl inward (contract) toward the palm. Eventually, the fingers may not be able to straighten out. This condition affects some or all of the fingers and the palm of the hand. It is often passed along from parent to child (inherited). Dupuytren contracture is a long-term (chronic) condition that develops (progresses) slowly over time. There is no cure, but symptoms can be managed and progression can be slowed with treatment. This condition is usually not dangerous or painful, but it can interfere with everyday tasks. What are the causes? This condition is caused by tissue (fascia) in the palm getting thicker and tighter. When the fascia thickens, it pulls on the cords of tissue (tendons) that control finger movement. This causes the fingers to contract. The cause of fascia thickening is not known. What increases the risk? This condition may be more likely to develop in:  People who are age 79 or older.  Men.  People with a family history of this condition.  People who use tobacco products, including cigarettes, chewing tobacco, and e-cigarettes.  People who drink alcohol excessively.  People with diabetes.  People with autoimmune diseases, such as HIV.  People with seizure disorders.  What are the signs or symptoms? Symptoms may develop in one or both hands. Any of the fingers can contract. The fingers farthest from the thumb are commonly affected. Usually, this condition is painless. You may have discomfort when holding or grabbing objects. Early symptoms of this condition may include:  Thick, puckered skin on the hand.  One or more lumps (nodules) on the palm. Nodules  may be tender when they first appear, but they are generally painless.  Symptoms of this condition develop slowly over months or years. Later symptoms of this condition may include:  Thick cords of tissue in the palm.  Fingers curled up toward the palm.  Inability to straighten the fingers into their normal position.  How is this diagnosed? This condition is diagnosed with a physical exam, which may include:  Looking at your hands and feeling your hands. This is to check for thickened fascia and nodules.  Measuring finger motion.  Doing the [employer]. You may be asked to try to put your hand on a surface, with your palm down and your fingers straight out.  How is this treated? There is no cure for this condition, but treatment can make symptoms more manageable and relieve discomfort. Treatment options may include:  Physical therapy. This can strengthen your hand and increase flexibility.  Occupational therapy. This can help you with everyday tasks that may be more difficult because of your condition.  A hand splint.  Shots (injections). Substances may be injected into your hand, such as: ? Medicines that help to decrease swelling (corticosteroids). ? Proteins (collagenase) to weaken thick tissue. After a collagenase injection, your health care provider may stretch your fingers.  Needle aponeurotomy. In this procedure, a needle is pushed through the skin and into the fascia. Moving the needle against the fascia can weaken or break up the thick tissue.  Surgery. This may be needed if your condition causes discomfort or interferes with everyday activities. Physical therapy is usually needed  after surgery.  In some cases, symptoms never develop to the point of needing major treatment, and caring for yourself at home can be enough to manage your condition. Symptoms often return after treatment. Follow these instructions at home: If you have a splint:  Do not put  pressure on any part of the splint until it is fully hardened. This may take several hours.  Wear the splint as told by your health care provider. Remove it only as told by your health care provider.  Loosen the splint if your fingers tingle, become numb, or turn cold and blue.  Do not let your splint get wet if it is not waterproof. ? If your splint is not waterproof, cover it with a watertight covering when you take a bath or a shower. ? Do not take baths, swim, or use a hot tub until your health care provider approves. Ask your health care provider if you can take showers. You may only be allowed to take sponge baths for bathing.  Keep the splint clean.  Ask your health care provider when it is safe to drive. Hand Care  Take these actions to help protect your hand from possible injury: ? Use tools that have padded grips. ? Wear protective gloves while you work with your hands. ? Avoid repetitive hand movements.  Avoid actions that cause pain or discomfort.  Stretch your hand by gently pulling your fingers backward toward your wrist. Do this as often as is comfortable. Stop if this causes pain.  Gently massage your hand as often as is comfortable.  If directed, apply heat to the affected area as often as told by your health care provider. Use the heat source that your health care provider recommends, such as a moist heat pack or a heating pad. ? Place a towel between your skin and the heat source. ? Leave the heat on for 20-30 minutes. ? Remove the heat if your skin turns bright red. This is especially important if you are unable to feel pain, heat, or cold. You may have a greater risk of getting burned. General instructions  Take over-the-counter and prescription medicines only as told by your health care provider.  Manage any other conditions that you have, such as diabetes.  If physical therapy was prescribed, do exercises as told by your health care provider.  Keep all  follow-up visits as told by your health care provider. This is important. Contact a health care provider if:  You develop new symptoms, or your symptoms get worse.  You have pain that gets worse or does not get better with medicine.  You have difficulty or discomfort with everyday tasks.  You have problems with your splint.  You develop numbness or tingling. Get help right away if:  You have severe pain.  Your fingers change color or become unusually cold. This information is not intended to replace advice given to you by your health care provider. Make sure you discuss any questions you have with your health care provider. Document Released: 12/02/2008 Document Revised: 03/21/2015 Document Reviewed: 06/29/2014 Elsevier Interactive Patient Education  [employer]2018 [employer]

## 2016-07-23 ENCOUNTER — Encounter (HOSPITAL_COMMUNITY): Payer: Self-pay | Admitting: Emergency Medicine

## 2016-07-23 ENCOUNTER — Emergency Department (HOSPITAL_COMMUNITY): Payer: Medicare Other

## 2016-07-23 ENCOUNTER — Inpatient Hospital Stay (HOSPITAL_COMMUNITY)
Admission: EM | Admit: 2016-07-23 | Discharge: 2016-07-25 | DRG: 287 | Disposition: A | Discharge status: Home / Self Care | Payer: Medicare Other | Attending: Family Medicine | Admitting: Family Medicine

## 2016-07-23 DIAGNOSIS — R072 Precordial pain: Secondary | ICD-10-CM | POA: Diagnosis not present

## 2016-07-23 DIAGNOSIS — I509 Heart failure, unspecified: Secondary | ICD-10-CM | POA: Diagnosis present

## 2016-07-23 DIAGNOSIS — I25119 Atherosclerotic heart disease of native coronary artery with unspecified angina pectoris: Secondary | ICD-10-CM | POA: Diagnosis not present

## 2016-07-23 DIAGNOSIS — K219 Gastro-esophageal reflux disease without esophagitis: Secondary | ICD-10-CM | POA: Diagnosis present

## 2016-07-23 DIAGNOSIS — R001 Bradycardia, unspecified: Secondary | ICD-10-CM | POA: Diagnosis not present

## 2016-07-23 DIAGNOSIS — I110 Hypertensive heart disease with heart failure: Secondary | ICD-10-CM | POA: Diagnosis present

## 2016-07-23 DIAGNOSIS — R9439 Abnormal result of other cardiovascular function study: Secondary | ICD-10-CM | POA: Diagnosis present

## 2016-07-23 DIAGNOSIS — R1013 Epigastric pain: Secondary | ICD-10-CM

## 2016-07-23 DIAGNOSIS — Z7982 Long term (current) use of aspirin: Secondary | ICD-10-CM

## 2016-07-23 DIAGNOSIS — I10 Essential (primary) hypertension: Secondary | ICD-10-CM | POA: Diagnosis not present

## 2016-07-23 DIAGNOSIS — I472 Ventricular tachycardia: Secondary | ICD-10-CM | POA: Diagnosis not present

## 2016-07-23 DIAGNOSIS — Z8249 Family history of ischemic heart disease and other diseases of the circulatory system: Secondary | ICD-10-CM

## 2016-07-23 DIAGNOSIS — R0789 Other chest pain: Secondary | ICD-10-CM

## 2016-07-23 DIAGNOSIS — Z888 Allergy status to other drugs, medicaments and biological substances status: Secondary | ICD-10-CM

## 2016-07-23 DIAGNOSIS — Z87891 Personal history of nicotine dependence: Secondary | ICD-10-CM

## 2016-07-23 DIAGNOSIS — R079 Chest pain, unspecified: Secondary | ICD-10-CM | POA: Diagnosis not present

## 2016-07-23 DIAGNOSIS — E119 Type 2 diabetes mellitus without complications: Secondary | ICD-10-CM

## 2016-07-23 DIAGNOSIS — E784 Other hyperlipidemia: Secondary | ICD-10-CM | POA: Diagnosis not present

## 2016-07-23 DIAGNOSIS — R008 Other abnormalities of heart beat: Secondary | ICD-10-CM | POA: Diagnosis not present

## 2016-07-23 DIAGNOSIS — E785 Hyperlipidemia, unspecified: Secondary | ICD-10-CM | POA: Diagnosis not present

## 2016-07-23 DIAGNOSIS — Z79899 Other long term (current) drug therapy: Secondary | ICD-10-CM

## 2016-07-23 DIAGNOSIS — K5900 Constipation, unspecified: Secondary | ICD-10-CM | POA: Diagnosis present

## 2016-07-23 DIAGNOSIS — I252 Old myocardial infarction: Secondary | ICD-10-CM

## 2016-07-23 DIAGNOSIS — R0602 Shortness of breath: Secondary | ICD-10-CM | POA: Diagnosis not present

## 2016-07-23 DIAGNOSIS — B369 Superficial mycosis, unspecified: Secondary | ICD-10-CM | POA: Diagnosis present

## 2016-07-23 DIAGNOSIS — I208 Other forms of angina pectoris: Secondary | ICD-10-CM | POA: Diagnosis present

## 2016-07-23 DIAGNOSIS — K449 Diaphragmatic hernia without obstruction or gangrene: Secondary | ICD-10-CM | POA: Diagnosis present

## 2016-07-23 HISTORY — DX: Anemia, unspecified: D64.9

## 2016-07-23 HISTORY — DX: Gastric ulcer, unspecified as acute or chronic, without hemorrhage or perforation: K25.9

## 2016-07-23 HISTORY — DX: Hyperlipidemia, unspecified: E78.5

## 2016-07-23 HISTORY — DX: Unspecified glaucoma: H40.9

## 2016-07-23 HISTORY — DX: Diaphragmatic hernia without obstruction or gangrene: K44.9

## 2016-07-23 LAB — CBC WITH DIFFERENTIAL/PLATELET
Basophils Absolute: 0 10*3/uL (ref 0.0–0.1)
Basophils Relative: 0 %
Eosinophils Absolute: 0.2 10*3/uL (ref 0.0–0.7)
Eosinophils Relative: 4 %
HEMATOCRIT: 35.1 % — AB (ref 36.0–46.0)
Hemoglobin: 11.1 g/dL — ABNORMAL LOW (ref 12.0–15.0)
Lymphocytes Relative: 42 %
Lymphs Abs: 2.2 10*3/uL (ref 0.7–4.0)
MCH: 28 pg (ref 26.0–34.0)
MCHC: 31.6 g/dL (ref 30.0–36.0)
MCV: 88.6 fL (ref 78.0–100.0)
MONO ABS: 0.5 10*3/uL (ref 0.1–1.0)
MONOS PCT: 9 %
NEUTROS ABS: 2.4 10*3/uL (ref 1.7–7.7)
Neutrophils Relative %: 45 %
Platelets: 339 10*3/uL (ref 150–400)
RBC: 3.96 MIL/uL (ref 3.87–5.11)
RDW: 13.4 % (ref 11.5–15.5)
WBC: 5.3 10*3/uL (ref 4.0–10.5)

## 2016-07-23 LAB — COMPREHENSIVE METABOLIC PANEL
ALT: 15 U/L (ref 14–54)
AST: 25 U/L (ref 15–41)
Albumin: 3.6 g/dL (ref 3.5–5.0)
Alkaline Phosphatase: 45 U/L (ref 38–126)
Anion gap: 8 (ref 5–15)
BILIRUBIN TOTAL: 0.3 mg/dL (ref 0.3–1.2)
BUN: 13 mg/dL (ref 6–20)
CO2: 29 mmol/L (ref 22–32)
Calcium: 9.1 mg/dL (ref 8.9–10.3)
Chloride: 104 mmol/L (ref 101–111)
Creatinine, Ser: 0.88 mg/dL (ref 0.44–1.00)
Glucose, Bld: 101 mg/dL — ABNORMAL HIGH (ref 65–99)
POTASSIUM: 3.4 mmol/L — AB (ref 3.5–5.1)
Sodium: 141 mmol/L (ref 135–145)
TOTAL PROTEIN: 6.3 g/dL — AB (ref 6.5–8.1)

## 2016-07-23 LAB — I-STAT TROPONIN, ED: TROPONIN I, POC: 0 ng/mL (ref 0.00–0.08)

## 2016-07-23 LAB — TROPONIN I

## 2016-07-23 LAB — TSH: TSH: 0.549 u[IU]/mL (ref 0.350–4.500)

## 2016-07-23 LAB — BRAIN NATRIURETIC PEPTIDE: B NATRIURETIC PEPTIDE 5: 95.2 pg/mL (ref 0.0–100.0)

## 2016-07-23 MED ORDER — ASPIRIN EC 325 MG PO TBEC
325.0000 mg | DELAYED_RELEASE_TABLET | Freq: Every day | ORAL | Status: DC
  Administered 2016-07-24: 325 mg via ORAL
  Filled 2016-07-23: qty 1

## 2016-07-23 MED ORDER — NITROGLYCERIN 0.4 MG SL SUBL
0.4000 mg | SUBLINGUAL_TABLET | SUBLINGUAL | Status: DC | PRN
  Administered 2016-07-23 (×2): 0.4 mg via SUBLINGUAL
  Filled 2016-07-23: qty 1

## 2016-07-23 MED ORDER — MORPHINE SULFATE (PF) 4 MG/ML IV SOLN: 2.0000 mg | INTRAVENOUS | Status: DC | PRN

## 2016-07-23 MED ORDER — NYSTATIN 100000 UNIT/GM EX POWD
Freq: Two times a day (BID) | CUTANEOUS | Status: DC
  Administered 2016-07-23: 21:00:00 via TOPICAL
  Administered 2016-07-24: 1 via TOPICAL
  Administered 2016-07-24 – 2016-07-25 (×2): via TOPICAL
  Filled 2016-07-23: qty 15

## 2016-07-23 MED ORDER — ONDANSETRON HCL 4 MG/2ML IJ SOLN: 4.0000 mg | Freq: Four times a day (QID) | INTRAMUSCULAR | Status: DC | PRN

## 2016-07-23 MED ORDER — ACETAMINOPHEN 325 MG PO TABS: 650.0000 mg | ORAL_TABLET | ORAL | Status: DC | PRN

## 2016-07-23 MED ORDER — PANTOPRAZOLE SODIUM 40 MG PO TBEC
40.0000 mg | DELAYED_RELEASE_TABLET | Freq: Every day | ORAL | Status: DC
  Administered 2016-07-24 – 2016-07-25 (×2): 40 mg via ORAL
  Filled 2016-07-23 (×2): qty 1

## 2016-07-23 MED ORDER — ENOXAPARIN SODIUM 40 MG/0.4ML ~~LOC~~ SOLN
40.0000 mg | SUBCUTANEOUS | Status: DC
  Administered 2016-07-23 – 2016-07-24 (×2): 40 mg via SUBCUTANEOUS
  Filled 2016-07-23 (×2): qty 0.4

## 2016-07-23 MED ORDER — AMOXICILLIN-POT CLAVULANATE 875-125 MG PO TABS: 1.0000 | ORAL_TABLET | Freq: Once | ORAL | Status: DC

## 2016-07-23 MED ORDER — GI COCKTAIL ~~LOC~~: 30.0000 mL | Freq: Four times a day (QID) | ORAL | Status: DC | PRN

## 2016-07-23 MED ORDER — ATORVASTATIN CALCIUM 20 MG PO TABS
20.0000 mg | ORAL_TABLET | Freq: Every day | ORAL | Status: DC
  Administered 2016-07-23 – 2016-07-24 (×2): 20 mg via ORAL
  Filled 2016-07-23 (×2): qty 1

## 2016-07-23 MED ORDER — POTASSIUM CHLORIDE CRYS ER 20 MEQ PO TBCR
40.0000 meq | EXTENDED_RELEASE_TABLET | Freq: Once | ORAL | Status: AC
  Administered 2016-07-23: 40 meq via ORAL
  Filled 2016-07-23: qty 2

## 2016-07-23 NOTE — Plan of Care (Signed)
Problem: Cardiac: Goal: Ability to achieve and maintain adequate cardiovascular perfusion will improve Outcome: Progressing Pt denies any CP, awaiting nuclear stress test in the AM.   Problem: Education: Goal: Understanding of cardiac disease, CV risk reduction, and recovery process will improve Outcome: Progressing Pt verbalizes understanding of stress test and it purpose. All questions have been answered at this time.

## 2016-07-23 NOTE — H&P (Signed)
History and Physical    Susan Brady ZOX:[id number] DOB: 1945/06/09 DOA: 07/23/2016  PCP: Pincus Sanes, MD Patient coming from: home  Chief Complaint: chest pain  HPI: Susan Brady is a very pleasant 71 y.o. female with medical history significant for diabetes, hypertension, hyperlipidemia, anemia, hiatal hernia, gastric ulcer resents to the emergency Department chief complaint chest pain. Patient being admitted for chest pain rule out.  Information is obtained from the patient and her daughter who is at the bedside. She states this morning she was outside the third she developed substernal/epigastric pain. She said he came on gradually she describes it as a pressure. She went inside and sat down and broke out into a sweat and developed some shortness of breath. She denies any nausea vomiting. She denies headache dizziness syncope or near-syncope. She denies lower extremity edema or orthopnea. EMS was called she was provided with 324 mg of aspirin and a nitroglycerin which provided relief. Prior to that she said nothing made the pain better or worse. She thinks the pain lasted about 30 minutes. Associated symptoms include decreased oral intake but denies unintentional weight loss.   ED Course: In emergency department she's afebrile somewhat soft blood pressure and intermittent bradycardia. At this time of admission she is chest pain-free  Review of Systems: As per HPI otherwise all other systems reviewed and are negative.   Ambulatory Status: Ambulates independently recent falls  Past Medical History:  Diagnosis Date  . Anemia   . CHF (congestive heart failure) (HCC)   . Diabetes mellitus without complication (HCC)   . Gastric ulcer   . Glaucoma   . Hiatal hernia   . Hyperlipidemia   . Hypertension     History reviewed. No pertinent surgical history.  Social History   Social History  . Marital status: Divorced    Spouse name: N/A  . Number of children: N/A  . Years of  education: N/A   Occupational History  . Not on file.   Social History Main Topics  . Smoking status: Former Games developer  . Smokeless tobacco: Never Used  . Alcohol use No  . Drug use: No  . Sexual activity: Not on file   Other Topics Concern  . Not on file   Social History Narrative  . No narrative on file    Allergies  Allergen Reactions  . Lisinopril Anaphylaxis    cough    Family History  Problem Relation Age of Onset  . Heart disease Mother   . Alcohol abuse Father   . Alcohol abuse Sister   . Diabetes Sister   . Hypertension Sister   . Cancer Sister   . CAD Sister        One sister with stents  . Alcohol abuse Brother   . Diabetes Brother   . Hypertension Brother     Prior to Admission medications   Medication Sig Start Date End Date Taking? Authorizing Provider  aspirin EC 81 MG tablet Take 1 tablet (81 mg total) by mouth daily. 03/24/15  Yes Burns, Bobette Mo, MD  cholecalciferol (VITAMIN D) 1000 UNITS tablet Take 1,000 Units by mouth daily.    Yes [provider]  Garlic 1000 MG CAPS Take 1,000 mg by mouth daily.   Yes [provider]  hydrALAZINE (APRESOLINE) 50 MG tablet TAKE 1 AND 1/2 TABLETS BY MOUTH THREE TIMES DAILY 04/03/16  Yes Burns, Bobette Mo, MD  hydrochlorothiazide (HYDRODIURIL) 25 MG tablet TAKE 1 TABLET(25 MG) BY MOUTH DAILY 01/01/16  Yes Pincus SanesBurns, Stacy J, MD  metoprolol tartrate (LOPRESSOR) 50 MG tablet Take 50 mg by mouth daily.   Yes [provider]  ACCU-CHEK AVIVA PLUS test strip TEST QD 09/18/14   [provider]  atorvastatin (LIPITOR) 20 MG tablet TAKE 1 TABLET(20 MG) BY MOUTH DAILY Patient not taking: Reported on 07/23/2016 06/04/16   Pincus SanesBurns, Stacy J, MD  furosemide (LASIX) 20 MG tablet TAKE 1 TABLET(20 MG) BY MOUTH TWICE DAILY Patient not taking: Reported on 07/23/2016 11/01/15   Pincus SanesBurns, Stacy J, MD  nystatin (NYSTATIN) powder APPLY TO ABDOMEN FOLDS AND UNDER BREAST 2-3 TIMES DAILY 04/05/16   Pincus SanesBurns, Stacy J, MD  omega-3  acid ethyl esters (LOVAZA) 1 G capsule Take 2 g by mouth 2 (two) times daily.    [provider]  omeprazole (PRILOSEC) 20 MG capsule Take 1 capsule (20 mg total) by mouth daily. 08/30/15   Pincus SanesBurns, Stacy J, MD  vitamin E 400 UNIT capsule Take 400 Units by mouth daily.    [provider]    Physical Exam: Vitals:   07/23/16 1415 07/23/16 1430 07/23/16 1445 07/23/16 1500  BP: 107/65 101/64 103/60 (!) 94/54  Pulse: (!) 49 (!) 54 63 (!) 53  Resp: (!) 25 (!) 24 (!) 21 (!) 23  Temp:      TempSrc:      SpO2: 97% 96% 93% 94%  Weight:      Height:         General:  Appears calm and comfortable Sitting up in bed in no acute distress Eyes:  PERRL, EOMI, normal lids, iris ENT:  grossly normal hearing, lips & tongue, mucous membranes of her mouth are moist and pink Neck:  no LAD, masses or thyromegaly Cardiovascular:  RRR, no m/r/g. No LE edema. Pedal pulses present and palpable Respiratory:  CTA bilaterally, no w/r/r. Normal respiratory effort. Abdomen:  soft, ntnd, is a bowel sounds no guarding or rebounding Skin:  no rash or induration seen on limited exam Musculoskeletal:  grossly normal tone BUE/BLE, good ROM, no bony abnormality Psychiatric:  grossly normal mood and affect, speech fluent and appropriate, AOx3 Neurologic:  CN 2-12 grossly intact, moves all extremities in coordinated fashion, sensation intact oriented 3 speech clear facial symmetry  Labs on Admission: I have personally reviewed following labs and imaging studies  CBC:  Recent Labs Lab 07/23/16 1345  WBC 5.3  NEUTROABS 2.4  HGB 11.1*  HCT 35.1*  MCV 88.6  PLT 339   Basic Metabolic Panel:  Recent Labs Lab 07/23/16 1345  NA 141  K 3.4*  CL 104  CO2 29  GLUCOSE 101*  BUN 13  CREATININE 0.88  CALCIUM 9.1   GFR: Estimated Creatinine Clearance: 69 mL/min (by C-G formula based on SCr of 0.88 mg/dL). Liver Function Tests:  Recent Labs Lab 07/23/16 1345  AST 25  ALT 15  ALKPHOS 45    BILITOT 0.3  PROT 6.3*  ALBUMIN 3.6   No results for input(s): LIPASE, AMYLASE in the last 168 hours. No results for input(s): AMMONIA in the last 168 hours. Coagulation Profile: No results for input(s): INR, PROTIME in the last 168 hours. Cardiac Enzymes: No results for input(s): CKTOTAL, CKMB, CKMBINDEX, TROPONINI in the last 168 hours. BNP (last 3 results) No results for input(s): PROBNP in the last 8760 hours. HbA1C: No results for input(s): HGBA1C in the last 72 hours. CBG: No results for input(s): GLUCAP in the last 168 hours. Lipid Profile: No results for input(s): CHOL, HDL,  LDLCALC, TRIG, CHOLHDL, LDLDIRECT in the last 72 hours. Thyroid Function Tests: No results for input(s): TSH, T4TOTAL, FREET4, T3FREE, THYROIDAB in the last 72 hours. Anemia Panel: No results for input(s): VITAMINB12, FOLATE, FERRITIN, TIBC, IRON, RETICCTPCT in the last 72 hours. Urine analysis:    Component Value Date/Time   COLORURINE AMBER (A) 08/12/2015 1755   APPEARANCEUR HAZY (A) 08/12/2015 1755   LABSPEC 1.021 08/12/2015 1755   PHURINE 5.0 08/12/2015 1755   GLUCOSEU NEGATIVE 08/12/2015 1755   HGBUR NEGATIVE 08/12/2015 1755   BILIRUBINUR SMALL (A) 08/12/2015 1755   KETONESUR NEGATIVE 08/12/2015 1755   PROTEINUR NEGATIVE 08/12/2015 1755   UROBILINOGEN 0.2 11/26/2012 1525   NITRITE NEGATIVE 08/12/2015 1755   LEUKOCYTESUR SMALL (A) 08/12/2015 1755    Creatinine Clearance: Estimated Creatinine Clearance: 69 mL/min (by C-G formula based on SCr of 0.88 mg/dL).  Sepsis Labs: @LABRCNTIP (procalcitonin:4,lacticidven:4) )No results found for this or any previous visit (from the past 240 hour(s)).   Radiological Exams on Admission: Dg Chest 2 View  Result Date: 07/23/2016 CLINICAL DATA:  Chest tightness.  Shortness breath. EXAM: CHEST  2 VIEW COMPARISON:  08/12/2015. FINDINGS: Mediastinum hilar structures normal. Lungs are clear. Cardiomegaly with normal pulmonary vascularity. No pleural  effusion or pneumothorax. Sliding hiatal hernia. IMPRESSION: 1. Cardiomegaly with normal pulmonary vascularity. No CHF. No acute pulmonary disease. 2. Sliding hiatal hernia . Chest is stable from prior study of 08/12/2015 . Electronically Signed   By: Maisie Fus  Register   On: 07/23/2016 14:15    EKG: Independently reviewed. Sinus rhythm Ventricular premature complex Left axis deviation Low voltage, extremity and precordial leads Borderline repolarization abnorm  Assessment/Plan Principal Problem:   Chest pain Active Problems:   Essential hypertension   GERD (gastroesophageal reflux disease)   Constipation   CAD (coronary artery disease)   Hyperlipidemia   Bradycardia   #1. Chest pain. Heart score 4. Patient with a history of CAD but no cardiac workup in the system. She reports having stress tests in the past but unsure of results. Pain-free on admission. Initial troponin negative. EKG as noted above. He is provided with aspirin and nitroglycerin by EMS and pain resolved -Admit to telemetry -Cycle troponin -Serial EKG -Lipid panel -Continue aspirin -Continue statin -GI cocktail -PPI -Cardiology consult -If stress test negative consider GI work up OP as hx gastric ulcer/gerd  #2. Bradycardia. Heart rate range 48-63. Home medications include metoprolol. -Hold metoprolol -Obtain a TSH  #3. Hypertension. Blood pressure soft in the emergency department. Home medications include metoprolol, hydralazine, Lasix, hydrochlorothiazide. -Hold hydralazine Lasix and hydrochlorothiazide -Hold metoprolol -Monitor  #4.Genella Rife. Stable at baseline --PPI    DVT prophylaxis: lovenox Code Status: full  Family Communication: daughter at bedside  Disposition Plan: home  Consults called: cardmaster  Admission status: obs    Gwenyth Bender MD [facility]  If 7PM-7AM, please contact night-coverage [email or web address] Password [facility]  07/23/2016, 4:41 PM

## 2016-07-23 NOTE — ED Notes (Signed)
Admitting at bedside 

## 2016-07-23 NOTE — Consult Note (Addendum)
Cardiology Consultation:   Patient ID: Susan Brady; [id number]; 1946-02-15   Admit date: 07/23/2016 Date of Consult: 07/23/2016  Primary Care Provider: Pincus SanesBurns, Stacy J, MD Primary Cardiologist: New to Dr. Antoine PocheHochrein  Patient Profile:   Susan Brady is a 71 y.o. female with a hx of DM, HTN, hyperlipidemia, mild anemia by prior labs, hiatal hernia, gastric ulcer whom we are asked to see by Dr. Erma HeritageIsaacs for chest pain. She and her daughter report a prior history of small heart attack 10-12 years ago in [place] in which they did a "dye test" but results unknown, did not require stenting or bypass. Her PCP has been following her for "fluid around the heart"/CHF with low dose Lasix. No prior cardiac studies available in system.  History of Present Illness:   She was in her USOH this AM. Earlier today she was outside with her dog. She began to come inside and noticed aching substernal lower chest/upper epigastric pressure. She went into her bedroom and sat down on the bed and broke out into a sweat and became SOB. She called for her daughter who lives with her, who called EMS. She was given ASA 324mg . She was also given NTG with eventual relief after about 20 minutes. She is pain free now. Pain was not worse with inspiration, palpation or movement. Total duration of pain was somewhere between 30 minutes to an hour. She's not had any more pain since being in the ER. Reports somewhat poor appetite intermittently lately. Also reports long history of orthostatic type dizziness, mild, no associated syncope or presyncope. In the ED, HR noted to be in the mid 40s-low 50s, labs notable for K 3.4, BNP 95, troponin neg, Hgb 11.1 (stable compared to 07/2015). CXR: Cardiomegaly with normal pulm vasculature, no CHF, no acute disease, + sliding hiatal hernia. EKG without significant change compared to prior. In general she is sedentary.  Of note in 07/2015 she was evaluated for abdominal pain with US showing enlarged GB  but no acute inflammation, symptoms improved with GI cocktail, symptoms felt due to gastritis. She subsequently underwent EGD and was diagnosed with a gastric ulcer. This was f/u by [facility] with subsequent endoscopy 11/2015 showing large hiatal hernia, evolving healing prior ulcer, and mild inflammation of gastrum.   Past Medical History:  Diagnosis Date  . Anemia   . CHF (congestive heart failure) (HCC)   . Diabetes mellitus without complication (HCC)   . Gastric ulcer   . Glaucoma   . Hiatal hernia   . Hypertension     History reviewed. No pertinent surgical history.   Inpatient Medications: Scheduled Meds: . [START ON 07/24/2016] aspirin EC  325 mg Oral Daily  . atorvastatin  20 mg Oral q1800  . enoxaparin (LOVENOX) injection  40 mg Subcutaneous Q24H  . nystatin   Topical BID  . [START ON 07/24/2016] pantoprazole  40 mg Oral Daily  . potassium chloride  40 mEq Oral Once   Continuous Infusions:  PRN Meds: acetaminophen, gi cocktail, morphine injection, nitroGLYCERIN, ondansetron (ZOFRAN) IV  Allergies:    Allergies  Allergen Reactions  . Lisinopril Anaphylaxis    cough    Social History:   Social History   Social History  . Marital status: Divorced    Spouse name: N/A  . Number of children: N/A  . Years of education: N/A   Occupational History  . Not on file.   Social History Main Topics  . Smoking status: Former Games developermoker  . Smokeless tobacco:  Never Used  . Alcohol use No  . Drug use: No  . Sexual activity: Not on file   Other Topics Concern  . Not on file   Social History Narrative  . No narrative on file    Family History:   The patient's family history includes Alcohol abuse in her brother, father, and sister; CAD in her sister; Cancer in her sister; Diabetes in her brother and sister; Heart disease in her mother; Hypertension in her brother and sister.  ROS:  Please see the history of present illness. No recent LEE, weight gain, bleeding. All other  ROS reviewed and negative.     Physical Exam/Data:   Vitals:   07/23/16 1415 07/23/16 1430 07/23/16 1445 07/23/16 1500  BP: 107/65 101/64 103/60 (!) 94/54  Pulse: (!) 49 (!) 54 63 (!) 53  Resp: (!) 25 (!) 24 (!) 21 (!) 23  Temp:      TempSrc:      SpO2: 97% 96% 93% 94%  Weight:      Height:       No intake or output data in the 24 hours ending 07/23/16 1601 Filed Weights   07/23/16 1257  Weight: 207 lb (93.9 kg)   Body mass index is 32.42 kg/m.  General: Well developed, well nourished obese AAF, in no acute distress. Head: Normocephalic, atraumatic, sclera non-icteric, no xanthomas, nares are without discharge. Poor eye contact at times Neck: Negative for carotid bruits. JVD not elevated. Lungs: Clear bilaterally to auscultation without wheezes, rales, or rhonchi. Breathing is unlabored. Heart: RRR with S1 S2. No murmurs, rubs, or gallops appreciated. Abdomen: Soft, non-tender, non-distended with normoactive bowel sounds. No hepatomegaly. No rebound/guarding. No obvious abdominal masses. Msk:  Strength and tone appear normal for age. Extremities: No clubbing or cyanosis. No edema.  Distal pedal pulses are 2+ and equal bilaterally. Neuro: Alert and oriented X 3. No facial asymmetry. No focal deficit. Moves all extremities spontaneously. Psych:  Responds to questions appropriately with a normal affect.  EKG:  The EKG was personally reviewed and demonstrates NSR 63bpm, one PVC, left axis deviation, low voltage extremity and precordial leads, nonspecific TW changes (no sig change from 07/2015)  Relevant CV Studies: N/A  Laboratory Data:  Chemistry Recent Labs Lab 07/23/16 1345  NA 141  K 3.4*  CL 104  CO2 29  GLUCOSE 101*  BUN 13  CREATININE 0.88  CALCIUM 9.1  GFRNONAA >60  GFRAA >60  ANIONGAP 8     Recent Labs Lab 07/23/16 1345  PROT 6.3*  ALBUMIN 3.6  AST 25  ALT 15  ALKPHOS 45  BILITOT 0.3   Hematology Recent Labs Lab 07/23/16 1345  WBC 5.3  RBC  3.96  HGB 11.1*  HCT 35.1*  MCV 88.6  MCH 28.0  MCHC 31.6  RDW 13.4  PLT 339    Recent Labs Lab 07/23/16 1410  TROPIPOC 0.00    BNP Recent Labs Lab 07/23/16 1345  BNP 95.2    Radiology/Studies:  Dg Chest 2 View  Result Date: 07/23/2016 CLINICAL DATA:  Chest tightness.  Shortness breath. EXAM: CHEST  2 VIEW COMPARISON:  08/12/2015. FINDINGS: Mediastinum hilar structures normal. Lungs are clear. Cardiomegaly with normal pulmonary vascularity. No pleural effusion or pneumothorax. Sliding hiatal hernia. IMPRESSION: 1. Cardiomegaly with normal pulmonary vascularity. No CHF. No acute pulmonary disease. 2. Sliding hiatal hernia . Chest is stable from prior study of 08/12/2015 . Electronically Signed   By: Maisie Fus  Register   On: 07/23/2016 14:15  Assessment and Plan:  59F with 71 y.o. female with a hx of DM, HTN, hyperlipidemia, mild anemia by prior labs, hiatal hernia, gastric ulcer per PCP notes whom we are asked to see by Dr. Erma Heritage for chest pain.   1. Chest pain - mixed atypical/typical features. No objective evidence of ischemia thus far. EKG shows generally low voltage but this has been present in the past. Agree with admission for rule out. Would cycle troponins. Given prior issues with gallbladder distention, gastric ulcer and hiatal hernia, consider evaluation for GI etiology as well. Will arrange Lexiscan nuclear stress test for AM. She is still yet to be evaluated by internal medicine - she would be OK to eat from cardiac standpoint but not sure if they have other procedures planned for her for today. I will place order for NPO after midnight.  2. Sinus bradycardia - she is on metoprolol 50mg  BID at home. HR in the 40s-50s here. She is not acutely symptomatic but does report a history of what sounds like orthostasis-type dizziness periodically. Would hold metoprolol and resume at lower dose in AM if HR stable. Check TSH. Replete potassium.   3. HTN - BP running softer. Would  hold antihypertensives and follow for now.  4. Hyperlipidemia - check lipid panel in AM.  5. Reported h/o CHF - appears euvolemic. F/u EF by nuclear. If abnormal can arrange echocardiogram.   Signed, Laurann Montana, PA-C  07/23/2016 4:01 PM   History and all data above reviewed.  Patient examined.  I agree with the findings as above. The patient presents with chest pain that is somewhat atypical.  She has upper epigastric pain that feels like her previous heart pain.  I do not have any records from this event.  She has no objective evidence of ischemia.  She had pain today while playing with the dog.  This was severe with nausea and SOB. It improved after NTG in the ED.  Currently pain free.  low voltage on the EKG but no acute changes The patient exam reveals COR:RRR  ,  Lungs: Clear  ,  Abd: Positive bowel sounds, no rebound no guarding, Ext No edema  .  All available labs, radiology testing, previous records reviewed. Agree with documented assessment and plan. Chest pain:  Typical and atypical features.  Moderate pretest prob of obstructive CAD.  Needs stress testing.  Would not be able to walk on a treadmill and so she will have a [employer].  Fayrene Fearing Jameica Couts  4:56 PM  07/23/2016

## 2016-07-23 NOTE — ED Triage Notes (Signed)
To ED via [facility] from home, pt c/o chest pain started while outside with dog, pain on EMS arrival 4/10-- received NTG x 1 sl, and 324mg  ASA enroute. Pt pain is currently 4/10.

## 2016-07-23 NOTE — ED Provider Notes (Signed)
[facility]-EMERGENCY DEPT Provider Note   CSN: [id number] Arrival date & time: 07/23/16  1247     History   Chief Complaint Chief Complaint  Patient presents with  . Chest Pain    HPI Susan Brady is a 71 y.o. female.  HPI   71 year old female with history of hypertension, hyperlipidemia, diabetes, who presents with chest pain. The patient states that she was walking her dog today when she developed acute onset of severe, aching, substernal chest pressure. She broke out in a cold sweat and had associated shortness of breath. The symptoms lasted approximately one hour until EMS arrived. She was given an aspirin and nitroglycerin with resolution of her chest pressure and pain. She has had similar symptoms in the past when she had "a small heart attack." She denies any stent placement, however. Denies any recent fevers or chills. No recent medication changes. Symptoms were worse with movement and relieved with rest.  Past Medical History:  Diagnosis Date  . Anemia   . CHF (congestive heart failure) (HCC)   . Diabetes mellitus without complication (HCC)   . Gastric ulcer   . Glaucoma   . Hiatal hernia   . Hyperlipidemia   . Hypertension     Patient Active Problem List   Diagnosis Date Noted  . Chest pain 07/23/2016  . Dupuytren's contracture of right hand 07/19/2016  . CAD (coronary artery disease) 06/25/2016  . Hyperlipidemia 06/25/2016  . Neck pain on right side 11/14/2015  . Poor sleep pattern 11/14/2015  . Constipation 11/14/2015  . Hiatal hernia, large 08/30/2015  . Gastric ulcer 08/30/2015  . Anorexia 08/30/2015  . Unsteadiness 08/30/2015  . Gastritis 08/15/2015  . Left shoulder pain 06/22/2015  . Essential hypertension 11/22/2014  . Diabetes mellitus (HCC) 11/22/2014  . Arthritis 11/22/2014  . Glaucoma 11/22/2014  . GERD (gastroesophageal reflux disease) 11/22/2014  . Varicose veins 11/22/2014  . Heart disease 11/22/2014  . Myocardial infarct, old 11/22/2014  .  Superficial thrombophlebitis of lower extremity 11/22/2014    History reviewed. No pertinent surgical history.  OB History    No data available       Home Medications    Prior to Admission medications   Medication Sig Start Date End Date Taking? Authorizing Provider  ACCU-CHEK AVIVA PLUS test strip TEST QD 09/18/14   [provider]  aspirin EC 81 MG tablet Take 1 tablet (81 mg total) by mouth daily. 03/24/15   Pincus Sanes, MD  atorvastatin (LIPITOR) 20 MG tablet TAKE 1 TABLET(20 MG) BY MOUTH DAILY 06/04/16   Pincus Sanes, MD  cholecalciferol (VITAMIN D) 1000 UNITS tablet Take 1,000 Units by mouth once a week.     [provider]  furosemide (LASIX) 20 MG tablet TAKE 1 TABLET(20 MG) BY MOUTH TWICE DAILY 11/01/15   Pincus Sanes, MD  Garlic 1000 MG CAPS Take 1,000 mg by mouth daily.    [provider]  hydrALAZINE (APRESOLINE) 50 MG tablet TAKE 1 AND 1/2 TABLETS BY MOUTH THREE TIMES DAILY 04/03/16   Pincus Sanes, MD  hydrochlorothiazide (HYDRODIURIL) 25 MG tablet TAKE 1 TABLET(25 MG) BY MOUTH DAILY 01/01/16   Pincus Sanes, MD  metoprolol (LOPRESSOR) 50 MG tablet TAKE 1 TABLET(50 MG) BY MOUTH TWICE DAILY 05/27/16   Pincus Sanes, MD  nystatin (NYSTATIN) powder APPLY TO ABDOMEN FOLDS AND UNDER BREAST 2-3 TIMES DAILY 04/05/16   Pincus Sanes, MD  omega-3 acid ethyl esters (LOVAZA) 1 G capsule Take 2 g  by mouth 2 (two) times daily.    [provider]  omeprazole (PRILOSEC) 20 MG capsule Take 1 capsule (20 mg total) by mouth daily. 08/30/15   Pincus Sanes, MD  vitamin E 400 UNIT capsule Take 400 Units by mouth daily.    [provider]    Family History Family History  Problem Relation Age of Onset  . Heart disease Mother   . Alcohol abuse Father   . Alcohol abuse Sister   . Diabetes Sister   . Hypertension Sister   . Cancer Sister   . CAD Sister        One sister with stents  . Alcohol abuse Brother   . Diabetes Brother   .  Hypertension Brother     Social History Social History  Substance Use Topics  . Smoking status: Former Games developer  . Smokeless tobacco: Never Used  . Alcohol use No     Allergies   Lisinopril   Review of Systems Review of Systems  Constitutional: Positive for fatigue.  Respiratory: Positive for chest tightness and shortness of breath.   Cardiovascular: Positive for chest pain.  All other systems reviewed and are negative.    Physical Exam Updated Vital Signs BP (!) 94/54   Pulse (!) 53   Temp 98.3 F (36.8 C) (Oral)   Resp (!) 23   Ht 5\' 7"  (1.702 m)   Wt 93.9 kg (207 lb)   SpO2 94%   BMI 32.42 kg/m   Physical Exam  Constitutional: She is oriented to person, place, and time. She appears well-developed and well-nourished. No distress.  HENT:  Head: Normocephalic and atraumatic.  Eyes: Conjunctivae are normal.  Neck: Neck supple.  Cardiovascular: Normal rate, regular rhythm and normal heart sounds.  Exam reveals no friction rub.   No murmur heard. Pulmonary/Chest: Effort normal and breath sounds normal. No respiratory distress. She has no wheezes. She has no rales.  Abdominal: She exhibits no distension.  Musculoskeletal: She exhibits no edema.  Neurological: She is alert and oriented to person, place, and time. She exhibits normal muscle tone.  Skin: Skin is warm. Capillary refill takes less than 2 seconds.  Psychiatric: She has a normal mood and affect.  Nursing note and vitals reviewed.    ED Treatments / Results  Labs (all labs ordered are listed, but only abnormal results are displayed) Labs Reviewed  CBC WITH DIFFERENTIAL/PLATELET - Abnormal; Notable for the following:       Result Value   Hemoglobin 11.1 (*)    HCT 35.1 (*)    All other components within normal limits  COMPREHENSIVE METABOLIC PANEL - Abnormal; Notable for the following:    Potassium 3.4 (*)    Glucose, Bld 101 (*)    Total Protein 6.3 (*)    All other components within normal  limits  BRAIN NATRIURETIC PEPTIDE  TROPONIN I  TROPONIN I  TROPONIN I  I-STAT TROPOININ, ED    EKG  EKG Interpretation  Date/Time:  Tuesday July 23 2016 12:56:00 EDT Ventricular Rate:  63 PR Interval:    QRS Duration: 93 QT Interval:  426 QTC Calculation: 412 R Axis:   -38 Text Interpretation:  Sinus rhythm Ventricular premature complex Left axis deviation Low voltage, extremity and precordial leads Borderline repolarization abnormality Baseline wander in lead(s) V3 No significant change since last tracing Confirmed by Shaune Pollack [phone]) on 07/23/2016 4:09:41 PM       Radiology Dg Chest 2 View  Result Date: 07/23/2016  CLINICAL DATA:  Chest tightness.  Shortness breath. EXAM: CHEST  2 VIEW COMPARISON:  08/12/2015. FINDINGS: Mediastinum hilar structures normal. Lungs are clear. Cardiomegaly with normal pulmonary vascularity. No pleural effusion or pneumothorax. Sliding hiatal hernia. IMPRESSION: 1. Cardiomegaly with normal pulmonary vascularity. No CHF. No acute pulmonary disease. 2. Sliding hiatal hernia . Chest is stable from prior study of 08/12/2015 . Electronically Signed   By: Maisie Fushomas  Register   On: 07/23/2016 14:15    Procedures Procedures (including critical care time)  Medications Ordered in ED Medications  nitroGLYCERIN (NITROSTAT) SL tablet 0.4 mg (0.4 mg Sublingual Given 07/23/16 1438)  atorvastatin (LIPITOR) tablet 20 mg (not administered)  nystatin (MYCOSTATIN/NYSTOP) topical powder (not administered)  pantoprazole (PROTONIX) EC tablet 40 mg (not administered)  acetaminophen (TYLENOL) tablet 650 mg (not administered)  ondansetron (ZOFRAN) injection 4 mg (not administered)  enoxaparin (LOVENOX) injection 40 mg (not administered)  morphine 4 MG/ML injection 2 mg (not administered)  gi cocktail (Maalox,Lidocaine,Donnatal) (not administered)  aspirin EC tablet 325 mg (not administered)  potassium chloride SA (K-DUR,KLOR-CON) CR tablet 40 mEq (not administered)      Initial Impression / Assessment and Plan / ED Course  I have reviewed the triage vital signs and the nursing notes.  Pertinent labs & imaging results that were available during my care of the patient were reviewed by me and considered in my medical decision making (see chart for details).    71 year old female with history of hypertension, hyperlipidemia, diabetes, coronary disease, here with fairly typical and concerning chest pain. She is chest pain-free on arrival, however, and has received aspirin. Initial labs and EKG are reassuring. Troponin is negative. Will admit to hospitalists for high-risk chest pain evaluation. Cardiology consulted.  Final Clinical Impressions(s) / ED Diagnoses   Final diagnoses:  Chest pain, unspecified type     Shaune PollackIsaacs, Lesli Issa, MD 07/23/16 1610

## 2016-07-24 ENCOUNTER — Observation Stay (HOSPITAL_COMMUNITY): Payer: Medicare Other

## 2016-07-24 DIAGNOSIS — R079 Chest pain, unspecified: Secondary | ICD-10-CM | POA: Diagnosis not present

## 2016-07-24 DIAGNOSIS — I509 Heart failure, unspecified: Secondary | ICD-10-CM | POA: Diagnosis not present

## 2016-07-24 DIAGNOSIS — I25119 Atherosclerotic heart disease of native coronary artery with unspecified angina pectoris: Secondary | ICD-10-CM | POA: Diagnosis not present

## 2016-07-24 DIAGNOSIS — I110 Hypertensive heart disease with heart failure: Secondary | ICD-10-CM | POA: Diagnosis not present

## 2016-07-24 DIAGNOSIS — K219 Gastro-esophageal reflux disease without esophagitis: Secondary | ICD-10-CM | POA: Diagnosis not present

## 2016-07-24 DIAGNOSIS — E785 Hyperlipidemia, unspecified: Secondary | ICD-10-CM | POA: Diagnosis not present

## 2016-07-24 DIAGNOSIS — K449 Diaphragmatic hernia without obstruction or gangrene: Secondary | ICD-10-CM | POA: Diagnosis not present

## 2016-07-24 DIAGNOSIS — B369 Superficial mycosis, unspecified: Secondary | ICD-10-CM | POA: Diagnosis not present

## 2016-07-24 DIAGNOSIS — I472 Ventricular tachycardia: Secondary | ICD-10-CM | POA: Diagnosis not present

## 2016-07-24 DIAGNOSIS — R0789 Other chest pain: Secondary | ICD-10-CM | POA: Diagnosis not present

## 2016-07-24 DIAGNOSIS — I252 Old myocardial infarction: Secondary | ICD-10-CM | POA: Diagnosis not present

## 2016-07-24 DIAGNOSIS — R0602 Shortness of breath: Secondary | ICD-10-CM | POA: Diagnosis not present

## 2016-07-24 DIAGNOSIS — R001 Bradycardia, unspecified: Secondary | ICD-10-CM

## 2016-07-24 DIAGNOSIS — E119 Type 2 diabetes mellitus without complications: Secondary | ICD-10-CM | POA: Diagnosis not present

## 2016-07-24 DIAGNOSIS — R008 Other abnormalities of heart beat: Secondary | ICD-10-CM | POA: Diagnosis not present

## 2016-07-24 DIAGNOSIS — I10 Essential (primary) hypertension: Secondary | ICD-10-CM | POA: Diagnosis not present

## 2016-07-24 DIAGNOSIS — K5900 Constipation, unspecified: Secondary | ICD-10-CM | POA: Diagnosis not present

## 2016-07-24 LAB — BASIC METABOLIC PANEL
Anion gap: 9 (ref 5–15)
BUN: 11 mg/dL (ref 6–20)
CALCIUM: 9 mg/dL (ref 8.9–10.3)
CO2: 30 mmol/L (ref 22–32)
CREATININE: 0.71 mg/dL (ref 0.44–1.00)
Chloride: 101 mmol/L (ref 101–111)
GFR calc non Af Amer: >60 mL/min (ref >60)
Glucose, Bld: 92 mg/dL (ref 65–99)
Potassium: 3.6 mmol/L (ref 3.5–5.1)
SODIUM: 140 mmol/L (ref 135–145)

## 2016-07-24 LAB — NM MYOCAR MULTI W/SPECT W/WALL MOTION / EF
CHL CUP RESTING HR STRESS: 49 {beats}/min
CSEPED: 5 min
CSEPEDS: 17 s
Estimated workload: 1 METS
Peak HR: 109 {beats}/min

## 2016-07-24 LAB — LIPID PANEL
CHOL/HDL RATIO: 2 ratio
CHOLESTEROL: 111 mg/dL (ref 0–200)
HDL: 56 mg/dL (ref >40)
LDL CALC: 42 mg/dL (ref 0–99)
Triglycerides: 66 mg/dL (ref <150)
VLDL: 13 mg/dL (ref 0–40)

## 2016-07-24 LAB — MAGNESIUM: Magnesium: 2 mg/dL (ref 1.7–2.4)

## 2016-07-24 MED ORDER — REGADENOSON 0.4 MG/5ML IV SOLN
INTRAVENOUS | Status: AC
Start: 2016-07-24 — End: 2016-07-24
  Filled 2016-07-24: qty 5

## 2016-07-24 MED ORDER — REGADENOSON 0.4 MG/5ML IV SOLN
0.4000 mg | Freq: Once | INTRAVENOUS | Status: AC
  Administered 2016-07-24: 0.4 mg via INTRAVENOUS

## 2016-07-24 MED ORDER — ASPIRIN EC 325 MG PO TBEC
325.0000 mg | DELAYED_RELEASE_TABLET | Freq: Every day | ORAL | Status: DC
  Administered 2016-07-25: 325 mg via ORAL
  Filled 2016-07-24 (×2): qty 1

## 2016-07-24 MED ORDER — POTASSIUM CHLORIDE CRYS ER 20 MEQ PO TBCR
40.0000 meq | EXTENDED_RELEASE_TABLET | Freq: Once | ORAL | Status: AC
  Administered 2016-07-24: 40 meq via ORAL
  Filled 2016-07-24: qty 2

## 2016-07-24 MED ORDER — TECHNETIUM TC 99M TETROFOSMIN IV KIT
30.0000 | PACK | Freq: Once | INTRAVENOUS | Status: AC | PRN
  Administered 2016-07-24: 30 via INTRAVENOUS

## 2016-07-24 MED ORDER — TECHNETIUM TC 99M TETROFOSMIN IV KIT
10.0000 | PACK | Freq: Once | INTRAVENOUS | Status: AC | PRN
  Administered 2016-07-24: 10 via INTRAVENOUS

## 2016-07-24 NOTE — Progress Notes (Signed)
Progress Note  Patient Name: Susan Brady Date of Encounter: 07/24/2016  Primary Cardiologist: New to Dr. Antoine Poche  Subjective   Feeling well. No chest pain, sob or palpitations. Seen in nuc med.   Inpatient Medications    Scheduled Meds: . aspirin EC  325 mg Oral Daily  . atorvastatin  20 mg Oral q1800  . enoxaparin (LOVENOX) injection  40 mg Subcutaneous Q24H  . nystatin   Topical BID  . pantoprazole  40 mg Oral Daily  . regadenoson       Continuous Infusions:  PRN Meds: acetaminophen, gi cocktail, morphine injection, nitroGLYCERIN, ondansetron (ZOFRAN) IV   Vital Signs    Vitals:   07/24/16 0933 07/24/16 0934 07/24/16 0935 07/24/16 0936  BP: 129/84 (!) 158/75  (!) 174/71  Pulse: 99 (!) 104 100 94  Resp:      Temp:      TempSrc:      SpO2:      Weight:      Height:        Intake/Output Summary (Last 24 hours) at 07/24/16 0937 Last data filed at 07/24/16 0021  Gross per 24 hour  Intake              240 ml  Output                0 ml  Net              240 ml   Filed Weights   07/23/16 1257 07/23/16 1741  Weight: 207 lb (93.9 kg) 208 lb 6.4 oz (94.5 kg)    Telemetry    Unable to Personally Review as patient seen in nuc med  ECG    NSR with TWI in anterior inferior and lateral leads - Personally Reviewed  Physical Exam   GEN: No acute distress.   Neck: No JVD Cardiac: RRR, no murmurs, rubs, or gallops.  Respiratory: Clear to auscultation bilaterally. GI: Soft, nontender, non-distended  MS: No edema; No deformity. Neuro:  Nonfocal  Psych: Normal affect   Labs    Chemistry Recent Labs Lab 07/23/16 1345 07/24/16 0741  NA 141 140  K 3.4* 3.6  CL 104 101  CO2 29 30  GLUCOSE 101* 92  BUN 13 11  CREATININE 0.88 0.71  CALCIUM 9.1 9.0  PROT 6.3*  --   ALBUMIN 3.6  --   AST 25  --   ALT 15  --   ALKPHOS 45  --   BILITOT 0.3  --   GFRNONAA >60 >60  GFRAA >60 >60  ANIONGAP 8 9     Hematology Recent Labs Lab 07/23/16 1345  WBC  5.3  RBC 3.96  HGB 11.1*  HCT 35.1*  MCV 88.6  MCH 28.0  MCHC 31.6  RDW 13.4  PLT 339    Cardiac Enzymes Recent Labs Lab 07/23/16 1642 07/23/16 1833 07/23/16 2134  TROPONINI <0.03 <0.03 <0.03    Recent Labs Lab 07/23/16 1410  TROPIPOC 0.00     BNP Recent Labs Lab 07/23/16 1345  BNP 95.2     DDimer No results for input(s): DDIMER in the last 168 hours.   Radiology    Dg Chest 2 View  Result Date: 07/23/2016 CLINICAL DATA:  Chest tightness.  Shortness breath. EXAM: CHEST  2 VIEW COMPARISON:  08/12/2015. FINDINGS: Mediastinum hilar structures normal. Lungs are clear. Cardiomegaly with normal pulmonary vascularity. No pleural effusion or pneumothorax. Sliding hiatal hernia. IMPRESSION: 1. Cardiomegaly with normal pulmonary vascularity.  No CHF. No acute pulmonary disease. 2. Sliding hiatal hernia . Chest is stable from prior study of 08/12/2015 . Electronically Signed   By: Maisie Fushomas  Register   On: 07/23/2016 14:15    Cardiac Studies   Pending lexiscan today   Patient Profile     Susan Brady is a 71 y.o. female with a hx of DM, HTN, hyperlipidemia, mild anemia by prior labs, hiatal hernia, gastric ulcer whom we are asked to see by Dr. Erma HeritageIsaacs for chest pain.  Assessment & Plan    1. Chest pain - troponin x 3 negative. EKG with new TWI today. No further chest pain. Pending lexiscan today. Continue ASA and statin.   2. HTN - Antihypertensive on hold for soft BP. Intermittently elevated today. Consider resuming later today.   3. HLD - 07/24/2016: Cholesterol 111; HDL 56; LDL Cholesterol 42; Triglycerides 66; VLDL 13  - Continue statin  Signed, Bhagat,Bhavinkumar, PA  07/24/2016, 9:37 AM    History and all data above reviewed.  Patient examined.  I agree with the findings as above .  she has not had further chest pain.  However, she has had palpitations.  She had bigeminy briefly on tele. The patient exam reveals COR:RRR  ,  Lungs: Clear  ,  Abd: Positive bowel  sounds, no rebound no guarding, Ext no edema  .  All available labs, radiology testing, previous records reviewed. Agree with documented assessment and plan. Chest pain:  No objective evidence of ischemia.  Home if Lexiscan Myoview is negative.  I would not suggest a change in her meds with her palpitations.  She has baseline bradycardia.    Susan Brady  11:19 AM  07/24/2016

## 2016-07-24 NOTE — Progress Notes (Signed)
Patient presented for Lexiscan. Tolerated procedure well. Pending final stress imaging result.  

## 2016-07-24 NOTE — Care Management Obs Status (Signed)
MEDICARE OBSERVATION STATUS NOTIFICATION   Patient Details  Name: Susan Brady MRN: [id number] Date of Birth: Mar 25, 1945   Medicare Observation Status Notification Given:  Yes    Lawerance Sabalebbie Sanaai Doane, RN 07/24/2016, 2:39 PM

## 2016-07-24 NOTE — Progress Notes (Signed)
RN received a call that patient had 18 runs of V-tach. This RN went and assessed patient would stated that she was just fine. Charge CopyN and on-coming RN made aware. Strip was reviewed. Consulting civil engineerCharge RN paging MD for notification.

## 2016-07-24 NOTE — Progress Notes (Signed)
PROGRESS NOTE    Susan Brady  ZOX:[id number] DOB: 1945-03-04 DOA: 07/23/2016 PCP: Pincus SanesBurns, Stacy J, MD   Outpatient Specialists:    Brief Narrative:  Susan BeachBetty Brady is a 71 y.o. female who presents with CP warranting ACS r/o . Cardiology to see pt in consultation. From a bradycardia standpoint and in review of patient's records in the outpatient setting she tends to run low bradycardic routinely with a heart rate in the 50s. Patient asymptomatic in that regard.     Assessment & Plan:   Principal Problem:   Chest pain Active Problems:   Essential hypertension   GERD (gastroesophageal reflux disease)   Constipation   CAD (coronary artery disease)   Hyperlipidemia   Bradycardia   Chest pain-  -stress test is intermediate-- defer plan to cardiology -ASA/statin -CE negative  Brief run of v tach -Mg ok -replace K -resume BB  Bradycardia -TSH: normal --resume BB at same home dose upon d/c per cards  HTN -resume home meds as tolerated  GERD -PPI   DVT prophylaxis:  scd  Code Status: Full Code   Family Communication:   Disposition Plan:     Consultants:      Subjective: No chest pain this AM Back from stress test  Objective: Vitals:   07/24/16 0934 07/24/16 0935 07/24/16 0936 07/24/16 1052  BP: (!) 158/75  (!) 174/71 122/71  Pulse: (!) 104 100 94 61  Resp:      Temp:      TempSrc:      SpO2:      Weight:      Height:        Intake/Output Summary (Last 24 hours) at 07/24/16 1345 Last data filed at 07/24/16 0021  Gross per 24 hour  Intake              240 ml  Output                0 ml  Net              240 ml   Filed Weights   07/23/16 1257 07/23/16 1741  Weight: 93.9 kg (207 lb) 94.5 kg (208 lb 6.4 oz)    Examination:  General exam: Appears calm and comfortable  Respiratory system: Clear to auscultation. Respiratory effort normal. Cardiovascular system: S1 & S2 heard, RRR. No JVD, murmurs, rubs, gallops or clicks. No pedal  edema. Gastrointestinal system: Abdomen is nondistended, soft and nontender. No organomegaly or masses felt. Normal bowel sounds heard. Central nervous system: Alert and oriented. No focal neurological deficits. Extremities: Symmetric 5 x 5 power. Skin: No rashes, lesions or ulcers Psychiatry: Judgement and insight appear normal. Mood & affect appropriate.     Data Reviewed: I have personally reviewed following labs and imaging studies  CBC:  Recent Labs Lab 07/23/16 1345  WBC 5.3  NEUTROABS 2.4  HGB 11.1*  HCT 35.1*  MCV 88.6  PLT 339   Basic Metabolic Panel:  Recent Labs Lab 07/23/16 1345 07/24/16 0741  NA 141 140  K 3.4* 3.6  CL 104 101  CO2 29 30  GLUCOSE 101* 92  BUN 13 11  CREATININE 0.88 0.71  CALCIUM 9.1 9.0  MG  --  2.0   GFR: Estimated Creatinine Clearance: 74.7 mL/min (by C-G formula based on SCr of 0.71 mg/dL). Liver Function Tests:  Recent Labs Lab 07/23/16 1345  AST 25  ALT 15  ALKPHOS 45  BILITOT 0.3  PROT 6.3*  ALBUMIN 3.6  No results for input(s): LIPASE, AMYLASE in the last 168 hours. No results for input(s): AMMONIA in the last 168 hours. Coagulation Profile: No results for input(s): INR, PROTIME in the last 168 hours. Cardiac Enzymes:  Recent Labs Lab 07/23/16 1642 07/23/16 1833 07/23/16 2134  TROPONINI <0.03 <0.03 <0.03   BNP (last 3 results) No results for input(s): PROBNP in the last 8760 hours. HbA1C: No results for input(s): HGBA1C in the last 72 hours. CBG: No results for input(s): GLUCAP in the last 168 hours. Lipid Profile:  Recent Labs  07/24/16 0254  CHOL 111  HDL 56  LDLCALC 42  TRIG 66  CHOLHDL 2.0   Thyroid Function Tests:  Recent Labs  07/23/16 1642  TSH 0.549   Anemia Panel: No results for input(s): VITAMINB12, FOLATE, FERRITIN, TIBC, IRON, RETICCTPCT in the last 72 hours. Urine analysis:    Component Value Date/Time   COLORURINE AMBER (A) 08/12/2015 1755   APPEARANCEUR HAZY (A)  08/12/2015 1755   LABSPEC 1.021 08/12/2015 1755   PHURINE 5.0 08/12/2015 1755   GLUCOSEU NEGATIVE 08/12/2015 1755   HGBUR NEGATIVE 08/12/2015 1755   BILIRUBINUR SMALL (A) 08/12/2015 1755   KETONESUR NEGATIVE 08/12/2015 1755   PROTEINUR NEGATIVE 08/12/2015 1755   UROBILINOGEN 0.2 11/26/2012 1525   NITRITE NEGATIVE 08/12/2015 1755   LEUKOCYTESUR SMALL (A) 08/12/2015 1755    )No results found for this or any previous visit (from the past 240 hour(s)).    Anti-infectives    Start     Dose/Rate Route Frequency Ordered Stop   07/23/16 1500  amoxicillin-clavulanate (AUGMENTIN) 875-125 MG per tablet 1 tablet  Status:  Discontinued     1 tablet Oral  Once 07/23/16 1457 07/23/16 1457       Radiology Studies: Dg Chest 2 View  Result Date: 07/23/2016 CLINICAL DATA:  Chest tightness.  Shortness breath. EXAM: CHEST  2 VIEW COMPARISON:  08/12/2015. FINDINGS: Mediastinum hilar structures normal. Lungs are clear. Cardiomegaly with normal pulmonary vascularity. No pleural effusion or pneumothorax. Sliding hiatal hernia. IMPRESSION: 1. Cardiomegaly with normal pulmonary vascularity. No CHF. No acute pulmonary disease. 2. Sliding hiatal hernia . Chest is stable from prior study of 08/12/2015 . Electronically Signed   By: Maisie Fus  Register   On: 07/23/2016 14:15   Nm Myocar Multi W/spect Izetta Dakin Motion / Ef  Result Date: 07/24/2016 CLINICAL DATA:  Chest pain and shortness of breath for several days. Diabetes, hypertension, and hyperlipidemia. EXAM: MYOCARDIAL IMAGING WITH SPECT (REST AND PHARMACOLOGIC-STRESS) GATED LEFT VENTRICULAR WALL MOTION STUDY LEFT VENTRICULAR EJECTION FRACTION TECHNIQUE: Standard myocardial SPECT imaging was performed after resting intravenous injection of 10 mCi Tc-56m tetrofosmin. Subsequently, intravenous infusion of Lexiscan was performed under the supervision of the Cardiology staff. At peak effect of the drug, 30 mCi Tc-29m tetrofosmin was injected intravenously and standard  myocardial SPECT imaging was performed. Quantitative gated imaging was also performed to evaluate left ventricular wall motion, and estimate left ventricular ejection fraction. COMPARISON:  None. FINDINGS: Perfusion: A small to moderate area of reversibility is seen in the mid and distal anterior wall of the left ventricle, suspicious for myocardial ischemia. A moderate fixed defect is seen in the inferolateral wall of the left ventricle, suspicious for myocardial infarct. Wall Motion: Inferior wall hypokinesis. No left ventricular dilation. Left Ventricular Ejection Fraction: 75 % End diastolic volume 65 ml End systolic volume 16 ml IMPRESSION: 1. Small to moderate reversible defect in mid and distal anterior wall, suspicious for inducible myocardial ischemia. Fixed defect in the inferolateral wall  suspicious for myocardial infarct. 2. Inferior wall hypokinesis, without left ventricular dilatation. 3. Left ventricular ejection fraction 75% 4. Non invasive risk stratification*: Intermediate *2012 Appropriate Use Criteria for Coronary Revascularization Focused Update: J Am Coll Cardiol. 2012;59(9):857-881. http://content.dementiazones.com.aspx?articleid=[email or web address] Electronically Signed   By: Myles Rosenthal M.D.   On: 07/24/2016 13:37        Scheduled Meds: . aspirin EC  325 mg Oral Daily  . atorvastatin  20 mg Oral q1800  . enoxaparin (LOVENOX) injection  40 mg Subcutaneous Q24H  . nystatin   Topical BID  . pantoprazole  40 mg Oral Daily  . regadenoson       Continuous Infusions:   LOS: 0 days    Time spent: 25 min    Jairy Angulo U Zellie Jenning, DO [facility] Pager [phone]  If 7PM-7AM, please contact night-coverage [email or web address] Password [email or web address] 07/24/2016, 1:45 PM

## 2016-07-24 NOTE — Progress Notes (Signed)
Reviewed Myoview results with Dr Antoine PocheHochrein. Plan is for cath in am. Discussed with pt and her daughters. The patient understands that risks included but are not limited to stroke (1 in 1000), death (1 in 1000), kidney failure [usually temporary] (1 in 500), bleeding (1 in 200), allergic reaction [possibly serious] (1 in 200).  The patient understands and agrees to proceed. Pt on board for 3pm with Dr Sanjuana KavaMcAlhaney. May be able to give clear liquids tomorrow am once schedule is finalized.   Corine ShelterLUKE Tracer Gutridge PA-C 07/24/2016 4:12 PM

## 2016-07-25 ENCOUNTER — Encounter (HOSPITAL_COMMUNITY): Payer: Self-pay | Admitting: Cardiovascular Disease

## 2016-07-25 ENCOUNTER — Telehealth: Payer: Self-pay | Admitting: Cardiology

## 2016-07-25 ENCOUNTER — Inpatient Hospital Stay (HOSPITAL_COMMUNITY)
Admission: EM | Disposition: A | Discharge status: Home / Self Care | Payer: Self-pay | Source: Home / Self Care | Attending: Internal Medicine

## 2016-07-25 DIAGNOSIS — I252 Old myocardial infarction: Secondary | ICD-10-CM | POA: Diagnosis not present

## 2016-07-25 DIAGNOSIS — I472 Ventricular tachycardia: Secondary | ICD-10-CM | POA: Diagnosis not present

## 2016-07-25 DIAGNOSIS — B369 Superficial mycosis, unspecified: Secondary | ICD-10-CM | POA: Diagnosis present

## 2016-07-25 DIAGNOSIS — I110 Hypertensive heart disease with heart failure: Secondary | ICD-10-CM | POA: Diagnosis present

## 2016-07-25 DIAGNOSIS — R9439 Abnormal result of other cardiovascular function study: Secondary | ICD-10-CM | POA: Diagnosis present

## 2016-07-25 DIAGNOSIS — R008 Other abnormalities of heart beat: Secondary | ICD-10-CM | POA: Diagnosis present

## 2016-07-25 DIAGNOSIS — Z87891 Personal history of nicotine dependence: Secondary | ICD-10-CM | POA: Diagnosis not present

## 2016-07-25 DIAGNOSIS — K219 Gastro-esophageal reflux disease without esophagitis: Secondary | ICD-10-CM | POA: Diagnosis present

## 2016-07-25 DIAGNOSIS — E785 Hyperlipidemia, unspecified: Secondary | ICD-10-CM | POA: Diagnosis present

## 2016-07-25 DIAGNOSIS — K449 Diaphragmatic hernia without obstruction or gangrene: Secondary | ICD-10-CM | POA: Diagnosis present

## 2016-07-25 DIAGNOSIS — Z7982 Long term (current) use of aspirin: Secondary | ICD-10-CM | POA: Diagnosis not present

## 2016-07-25 DIAGNOSIS — E119 Type 2 diabetes mellitus without complications: Secondary | ICD-10-CM | POA: Diagnosis present

## 2016-07-25 DIAGNOSIS — Z8249 Family history of ischemic heart disease and other diseases of the circulatory system: Secondary | ICD-10-CM | POA: Diagnosis not present

## 2016-07-25 DIAGNOSIS — Z888 Allergy status to other drugs, medicaments and biological substances status: Secondary | ICD-10-CM | POA: Diagnosis not present

## 2016-07-25 DIAGNOSIS — I509 Heart failure, unspecified: Secondary | ICD-10-CM | POA: Diagnosis present

## 2016-07-25 DIAGNOSIS — R0789 Other chest pain: Secondary | ICD-10-CM

## 2016-07-25 DIAGNOSIS — Z79899 Other long term (current) drug therapy: Secondary | ICD-10-CM | POA: Diagnosis not present

## 2016-07-25 DIAGNOSIS — R079 Chest pain, unspecified: Secondary | ICD-10-CM | POA: Diagnosis not present

## 2016-07-25 DIAGNOSIS — R001 Bradycardia, unspecified: Secondary | ICD-10-CM | POA: Diagnosis present

## 2016-07-25 DIAGNOSIS — I208 Other forms of angina pectoris: Secondary | ICD-10-CM | POA: Diagnosis present

## 2016-07-25 DIAGNOSIS — I25119 Atherosclerotic heart disease of native coronary artery with unspecified angina pectoris: Secondary | ICD-10-CM | POA: Diagnosis present

## 2016-07-25 DIAGNOSIS — K5900 Constipation, unspecified: Secondary | ICD-10-CM | POA: Diagnosis present

## 2016-07-25 HISTORY — PX: LEFT HEART CATH AND CORONARY ANGIOGRAPHY: CATH118249

## 2016-07-25 LAB — PROTIME-INR
INR: 1.17
Prothrombin Time: 14.9 seconds (ref 11.4–15.2)

## 2016-07-25 LAB — BASIC METABOLIC PANEL
Anion gap: 7 (ref 5–15)
BUN: 7 mg/dL (ref 6–20)
CO2: 29 mmol/L (ref 22–32)
Calcium: 9.1 mg/dL (ref 8.9–10.3)
Chloride: 105 mmol/L (ref 101–111)
Creatinine, Ser: 0.71 mg/dL (ref 0.44–1.00)
GFR calc Af Amer: >60 mL/min (ref >60)
GFR calc non Af Amer: >60 mL/min (ref >60)
Glucose, Bld: 96 mg/dL (ref 65–99)
Potassium: 3.8 mmol/L (ref 3.5–5.1)
Sodium: 141 mmol/L (ref 135–145)

## 2016-07-25 LAB — MAGNESIUM: Magnesium: 2.2 mg/dL (ref 1.7–2.4)

## 2016-07-25 LAB — CBC
HCT: 34.9 % — ABNORMAL LOW (ref 36.0–46.0)
Hemoglobin: 10.9 g/dL — ABNORMAL LOW (ref 12.0–15.0)
MCH: 27.8 pg (ref 26.0–34.0)
MCHC: 31.2 g/dL (ref 30.0–36.0)
MCV: 89 fL (ref 78.0–100.0)
Platelets: 306 10*3/uL (ref 150–400)
RBC: 3.92 MIL/uL (ref 3.87–5.11)
RDW: 13.4 % (ref 11.5–15.5)
WBC: 5.7 10*3/uL (ref 4.0–10.5)

## 2016-07-25 SURGERY — LEFT HEART CATH AND CORONARY ANGIOGRAPHY
Anesthesia: LOCAL

## 2016-07-25 MED ORDER — METOPROLOL TARTRATE 50 MG PO TABS
50.0000 mg | ORAL_TABLET | Freq: Every day | ORAL | Status: DC
  Filled 2016-07-25: qty 1

## 2016-07-25 MED ORDER — SODIUM CHLORIDE 0.9% FLUSH: 3.0000 mL | INTRAVENOUS | Status: DC | PRN

## 2016-07-25 MED ORDER — SODIUM CHLORIDE 0.9 % WEIGHT BASED INFUSION
1.0000 mL/kg/h | INTRAVENOUS | Status: DC
  Administered 2016-07-25: 1 mL/kg/h via INTRAVENOUS

## 2016-07-25 MED ORDER — MIDAZOLAM HCL 2 MG/2ML IJ SOLN
INTRAMUSCULAR | Status: AC
  Filled 2016-07-25: qty 2

## 2016-07-25 MED ORDER — SODIUM CHLORIDE 0.9 % IV SOLN: 250.0000 mL | INTRAVENOUS | Status: DC | PRN

## 2016-07-25 MED ORDER — FENTANYL CITRATE (PF) 100 MCG/2ML IJ SOLN
INTRAMUSCULAR | Status: AC
  Filled 2016-07-25: qty 2

## 2016-07-25 MED ORDER — FENTANYL CITRATE (PF) 100 MCG/2ML IJ SOLN
INTRAMUSCULAR | Status: DC | PRN
Start: 2016-07-25 — End: 2016-07-25
  Administered 2016-07-25 (×2): 25 ug via INTRAVENOUS

## 2016-07-25 MED ORDER — IOPAMIDOL (ISOVUE-370) INJECTION 76%
INTRAVENOUS | Status: AC
  Filled 2016-07-25: qty 100

## 2016-07-25 MED ORDER — VERAPAMIL HCL 2.5 MG/ML IV SOLN
INTRAVENOUS | Status: AC
  Filled 2016-07-25: qty 2

## 2016-07-25 MED ORDER — METOPROLOL TARTRATE 25 MG PO TABS: 25.0000 mg | ORAL_TABLET | Freq: Every day | ORAL | Status: DC

## 2016-07-25 MED ORDER — HEPARIN SODIUM (PORCINE) 1000 UNIT/ML IJ SOLN
INTRAMUSCULAR | Status: DC | PRN
  Administered 2016-07-25: 5000 [IU] via INTRAVENOUS

## 2016-07-25 MED ORDER — LIDOCAINE HCL (PF) 1 % IJ SOLN
INTRAMUSCULAR | Status: AC
  Filled 2016-07-25: qty 30

## 2016-07-25 MED ORDER — IOPAMIDOL (ISOVUE-370) INJECTION 76%
INTRAVENOUS | Status: DC | PRN
  Administered 2016-07-25: 70 mL via INTRA_ARTERIAL

## 2016-07-25 MED ORDER — VERAPAMIL HCL 2.5 MG/ML IV SOLN
INTRAVENOUS | Status: DC | PRN
  Administered 2016-07-25: 10:00:00 via INTRA_ARTERIAL

## 2016-07-25 MED ORDER — LIDOCAINE HCL (PF) 1 % IJ SOLN
INTRAMUSCULAR | Status: DC | PRN
  Administered 2016-07-25: 2 mL

## 2016-07-25 MED ORDER — MIDAZOLAM HCL 2 MG/2ML IJ SOLN
INTRAMUSCULAR | Status: DC | PRN
Start: 2016-07-25 — End: 2016-07-25
  Administered 2016-07-25 (×2): 1 mg via INTRAVENOUS

## 2016-07-25 MED ORDER — ATORVASTATIN CALCIUM 20 MG PO TABS: ORAL_TABLET | ORAL | 1 refills | Status: DC

## 2016-07-25 MED ORDER — SODIUM CHLORIDE 0.9% FLUSH: 3.0000 mL | Freq: Two times a day (BID) | INTRAVENOUS | Status: DC

## 2016-07-25 MED ORDER — HEPARIN (PORCINE) IN NACL 2-0.9 UNIT/ML-% IJ SOLN
INTRAMUSCULAR | Status: AC
  Filled 2016-07-25: qty 1000

## 2016-07-25 MED ORDER — HEPARIN SODIUM (PORCINE) 1000 UNIT/ML IJ SOLN
INTRAMUSCULAR | Status: AC
  Filled 2016-07-25: qty 1

## 2016-07-25 MED ORDER — SODIUM CHLORIDE 0.9 % IV SOLN: INTRAVENOUS | Status: AC

## 2016-07-25 SURGICAL SUPPLY — 10 items
CATH INFINITI 5 FR JL3.5 (CATHETERS) ×2 IMPLANT
CATH INFINITI JR4 5F (CATHETERS) ×2 IMPLANT
DEVICE RAD COMP TR BAND LRG (VASCULAR PRODUCTS) ×2 IMPLANT
GLIDESHEATH SLEND SS 6F .021 (SHEATH) ×2 IMPLANT
GUIDEWIRE INQWIRE 1.5J.035X260 (WIRE) ×1 IMPLANT
INQWIRE 1.5J .035X260CM (WIRE) ×2
KIT HEART LEFT (KITS) ×2 IMPLANT
PACK CARDIAC CATHETERIZATION (CUSTOM PROCEDURE TRAY) ×2 IMPLANT
TRANSDUCER W/STOPCOCK (MISCELLANEOUS) ×2 IMPLANT
TUBING CIL FLEX 10 FLL-RA (TUBING) ×2 IMPLANT

## 2016-07-25 NOTE — Progress Notes (Signed)
Pt tolerated removal of tr band well. Radial site is a level 0. Education given on site care & when to call RN or MD. Site care handout given to pt. Will continue to monitor pt. Sanda LingerMilam, Ritesh Opara R, RN

## 2016-07-25 NOTE — Discharge Summary (Addendum)
Physician Discharge Summary  Nikira Kushnir ZOX:[id number] DOB: 02/04/1946 DOA: 07/23/2016  PCP: Pincus Sanes, MD  Admit date: 07/23/2016 Discharge date: 07/25/2016  Time spent: 35 minutes  Recommendations for Outpatient Follow-up:  1. Needs bmet 1 week 2. Was on PTA metoprolol tartrate 50 pta-bradycardic this admit--changed to OD 25 mg thisadmit-need OP consideration for Toprol XL vs discontinuation 3. Needs OP cardiology follow up   Discharge Diagnoses:  Principal Problem:   Chest pain of unknown etiology Active Problems:   Essential hypertension   Diabetes mellitus (HCC)   GERD (gastroesophageal reflux disease)   Constipation   CAD (coronary artery disease)   Hyperlipidemia   Bradycardia   Angina at rest [facility])   Abnormal stress test   Discharge Condition: improved  Diet recommendation: hh low salt  Filed Weights   07/23/16 1257 07/23/16 1741  Weight: 93.9 kg (207 lb) 94.5 kg (208 lb 6.4 oz)   71 ? CAD HTN DM TY2 HLD Reflux Prior gallbladder issues?  Admitted 07/23/2016 with nonspecific atypical pain in stomach and chest Felt to have low risk chest pain initially however troponins trended up T wave inversions noted in Myoview stress testing performed did reveal intermediate risk for coronary event Scheduled for cardiac catheterization 07/25/2016 3 PM  History of present illness:  Admitted--Because of persisitjn g symtpoms- Rx for both GERD and PUD but given high risk CP and Heart score >4, Myoview done which was intermiediate risk Had Cardiac cath--no lesions Stabilized for d/c treatin as OP for fungal intertrigo Dosing of BB needs addressing as OP by Cardiology    Discharge Exam: Vitals:   07/25/16 1038 07/25/16 1107  BP: 122/65 122/69  Pulse: (!) 48 (!) 44  Resp: 20 19  Temp:      General: eomi pleasant in nad Cardiovascular:  s1 s 2no m/r/g Respiratory: clear nad abd soft nt nd no rebound no guard Radial site is clean without any othe  rissue  Discharge Instructions   Discharge Instructions    Diet - low sodium heart healthy    Complete by:  As directed    Discharge instructions    Complete by:  As directed    PLEASE REMEMBER TO BRING ALL OF YOUR MEDICATIONS TO EACH OF YOUR FOLLOW-UP OFFICE VISITS.  PLEASE ATTEND ALL SCHEDULED FOLLOW-UP APPOINTMENTS.   Activity: Increase activity slowly as tolerated. You may shower, but no soaking baths (or swimming) for 1 week. No driving for 24 hours. No lifting over 5 lbs for 1 week. No sexual activity for 1 week.   You May Return to Work: in 1 week (if applicable)  Wound Care: You may wash cath site gently with soap and water. Keep cath site clean and dry. If you notice pain, swelling, bleeding or pus at your cath site, please call [phone].   Take your metorpolol at  A lower dose of 25 mg daily until you are seen by cardiology  Conitnue cream for fungal dermatitis under your breasts and in groin and follow up with pcp   Increase activity slowly    Complete by:  As directed      Current Discharge Medication List    CONTINUE these medications which have CHANGED   Details  atorvastatin (LIPITOR) 20 MG tablet TAKE 1 TABLET(20 MG) BY MOUTH DAILY Qty: 90 tablet, Refills: 1    metoprolol tartrate (LOPRESSOR) 25 MG tablet Take 1 tablet (25 mg total) by mouth daily.      CONTINUE these medications which have NOT CHANGED  Details  acetaminophen (TYLENOL) 500 MG tablet Take 500-1,000 mg by mouth every 6 (six) hours as needed for mild pain.    aspirin EC 81 MG tablet Take 1 tablet (81 mg total) by mouth daily.    cholecalciferol (VITAMIN D) 1000 UNITS tablet Take 1,000 Units by mouth daily.     Garlic 1000 MG CAPS Take 1,000 mg by mouth daily.    hydrALAZINE (APRESOLINE) 50 MG tablet TAKE 1 AND 1/2 TABLETS BY MOUTH THREE TIMES DAILY Qty: 405 tablet, Refills: 1    hydrochlorothiazide (HYDRODIURIL) 25 MG tablet TAKE 1 TABLET(25 MG) BY MOUTH DAILY Qty: 90 tablet,  Refills: 3    nystatin (NYSTATIN) powder APPLY TO ABDOMEN FOLDS AND UNDER BREAST 2-3 TIMES DAILY Qty: 60 g, Refills: 5    omega-3 acid ethyl esters (LOVAZA) 1 G capsule Take 1 g by mouth daily.     omeprazole (PRILOSEC) 20 MG capsule Take 1 capsule (20 mg total) by mouth daily. Qty: 30 capsule, Refills: 3    vitamin E 400 UNIT capsule Take 400 Units by mouth daily.       Allergies  Allergen Reactions  . Lisinopril Anaphylaxis    cough   Follow-up Information    Abelino Derrick, PA-C Follow up on 08/07/2016.   Specialties:  Cardiology, Radiology Why:  8:30 AM for hospital follow up and TCM Contact information: [address] [place] [address] [phone]            The results of significant diagnostics from this hospitalization (including imaging, microbiology, ancillary and laboratory) are listed below for reference.    Significant Diagnostic Studies: Dg Chest 2 View  Result Date: 07/23/2016 CLINICAL DATA:  Chest tightness.  Shortness breath. EXAM: CHEST  2 VIEW COMPARISON:  08/12/2015. FINDINGS: Mediastinum hilar structures normal. Lungs are clear. Cardiomegaly with normal pulmonary vascularity. No pleural effusion or pneumothorax. Sliding hiatal hernia. IMPRESSION: 1. Cardiomegaly with normal pulmonary vascularity. No CHF. No acute pulmonary disease. 2. Sliding hiatal hernia . Chest is stable from prior study of 08/12/2015 . Electronically Signed   By: Maisie Fus  Register   On: 07/23/2016 14:15   Nm Myocar Multi W/spect Izetta Dakin Motion / Ef  Result Date: 07/24/2016 CLINICAL DATA:  Chest pain and shortness of breath for several days. Diabetes, hypertension, and hyperlipidemia. EXAM: MYOCARDIAL IMAGING WITH SPECT (REST AND PHARMACOLOGIC-STRESS) GATED LEFT VENTRICULAR WALL MOTION STUDY LEFT VENTRICULAR EJECTION FRACTION TECHNIQUE: Standard myocardial SPECT imaging was performed after resting intravenous injection of 10 mCi Tc-41m tetrofosmin. Subsequently,  intravenous infusion of Lexiscan was performed under the supervision of the Cardiology staff. At peak effect of the drug, 30 mCi Tc-47m tetrofosmin was injected intravenously and standard myocardial SPECT imaging was performed. Quantitative gated imaging was also performed to evaluate left ventricular wall motion, and estimate left ventricular ejection fraction. COMPARISON:  None. FINDINGS: Perfusion: A small to moderate area of reversibility is seen in the mid and distal anterior wall of the left ventricle, suspicious for myocardial ischemia. A moderate fixed defect is seen in the inferolateral wall of the left ventricle, suspicious for myocardial infarct. Wall Motion: Inferior wall hypokinesis. No left ventricular dilation. Left Ventricular Ejection Fraction: 75 % End diastolic volume 65 ml End systolic volume 16 ml IMPRESSION: 1. Small to moderate reversible defect in mid and distal anterior wall, suspicious for inducible myocardial ischemia. Fixed defect in the inferolateral wall suspicious for myocardial infarct. 2. Inferior wall hypokinesis, without left ventricular dilatation. 3. Left ventricular ejection fraction 75% 4. Non  invasive risk stratification*: Intermediate *2012 Appropriate Use Criteria for Coronary Revascularization Focused Update: J Am Coll Cardiol. 2012;59(9):857-881. http://content.dementiazones.comonlinejacc.org/article.aspx?articleid=1201161 Electronically Signed   By: Myles RosenthalJohn  Stahl M.D.   On: 07/24/2016 13:37    Microbiology: No results found for this or any previous visit (from the past 240 hour(s)).   Labs: Basic Metabolic Panel:  Recent Labs Lab 07/23/16 1345 07/24/16 0741 07/25/16 0252  NA 141 140 141  K 3.4* 3.6 3.8  CL 104 101 105  CO2 29 30 29   GLUCOSE 101* 92 96  BUN 13 11 7   CREATININE 0.88 0.71 0.71  CALCIUM 9.1 9.0 9.1  MG  --  2.0 2.2   Liver Function Tests:  Recent Labs Lab 07/23/16 1345  AST 25  ALT 15  ALKPHOS 45  BILITOT 0.3  PROT 6.3*  ALBUMIN 3.6   No  results for input(s): LIPASE, AMYLASE in the last 168 hours. No results for input(s): AMMONIA in the last 168 hours. CBC:  Recent Labs Lab 07/23/16 1345 07/25/16 0252  WBC 5.3 5.7  NEUTROABS 2.4  --   HGB 11.1* 10.9*  HCT 35.1* 34.9*  MCV 88.6 89.0  PLT 339 306   Cardiac Enzymes:  Recent Labs Lab 07/23/16 1642 07/23/16 1833 07/23/16 2134  TROPONINI <0.03 <0.03 <0.03   BNP: BNP (last 3 results)  Recent Labs  08/12/15 1708 07/23/16 1345  BNP 82.0 95.2    ProBNP (last 3 results) No results for input(s): PROBNP in the last 8760 hours.  CBG: No results for input(s): GLUCAP in the last 168 hours.     SignedRhetta Mura:  Keaja Reaume, JAI-GURMUKH MD   [facility] 07/25/2016, 1:40 PM

## 2016-07-25 NOTE — Plan of Care (Signed)
Problem: Cardiovascular: Goal: Ability to achieve and maintain adequate cardiovascular perfusion will improve Outcome: Completed/Met Date Met: 07/25/16 Pt's site is a level 0. Education given to pt on site care & restrictions as well as when to call MD or RN.

## 2016-07-25 NOTE — Interval H&P Note (Signed)
History and Physical Interval Note:  07/25/2016 9:03 AM  Susan Brady  has presented today for cardiac cath with the diagnosis of unstable angina, abnormal nuclear stress test. The various methods of treatment have been discussed with the patient and family. After consideration of risks, benefits and other options for treatment, the patient has consented to  Procedure(s): Left Heart Cath and Coronary Angiography (N/A) as a surgical intervention .  The patient's history has been reviewed, patient examined, no change in status, stable for surgery.  I have reviewed the patient's chart and labs.  Questions were answered to the patient's satisfaction.    Cath Lab Visit (complete for each Cath Lab visit)  Clinical Evaluation Leading to the Procedure:   ACS: No.  Non-ACS:    Anginal Classification: CCS III  Anti-ischemic medical therapy: Minimal Therapy (1 class of medications)  Non-Invasive Test Results: Intermediate-risk stress test findings: cardiac mortality 1-3%/year  Prior CABG: No previous CABG         Verne Carrowhristopher McAlhany

## 2016-07-25 NOTE — H&P (View-Only) (Signed)
Progress Note  Patient Name: Susan Brady Date of Encounter: 07/24/2016  Primary Cardiologist: New to Dr. Antoine Brady  Subjective   Feeling well. No chest pain, sob or palpitations. Seen in nuc med.   Inpatient Medications    Scheduled Meds: . aspirin EC  325 mg Oral Daily  . atorvastatin  20 mg Oral q1800  . enoxaparin (LOVENOX) injection  40 mg Subcutaneous Q24H  . nystatin   Topical BID  . pantoprazole  40 mg Oral Daily  . regadenoson       Continuous Infusions:  PRN Meds: acetaminophen, gi cocktail, morphine injection, nitroGLYCERIN, ondansetron (ZOFRAN) IV   Vital Signs    Vitals:   07/24/16 0933 07/24/16 0934 07/24/16 0935 07/24/16 0936  BP: 129/84 (!) 158/75  (!) 174/71  Pulse: 99 (!) 104 100 94  Resp:      Temp:      TempSrc:      SpO2:      Weight:      Height:        Intake/Output Summary (Last 24 hours) at 07/24/16 0937 Last data filed at 07/24/16 0021  Gross per 24 hour  Intake              240 ml  Output                0 ml  Net              240 ml   Filed Weights   07/23/16 1257 07/23/16 1741  Weight: 207 lb (93.9 kg) 208 lb 6.4 oz (94.5 kg)    Telemetry    Unable to Personally Review as patient seen in nuc med  ECG    NSR with TWI in anterior inferior and lateral leads - Personally Reviewed  Physical Exam   GEN: No acute distress.   Neck: No JVD Cardiac: RRR, no murmurs, rubs, or gallops.  Respiratory: Clear to auscultation bilaterally. GI: Soft, nontender, non-distended  MS: No edema; No deformity. Neuro:  Nonfocal  Psych: Normal affect   Labs    Chemistry Recent Labs Lab 07/23/16 1345 07/24/16 0741  NA 141 140  K 3.4* 3.6  CL 104 101  CO2 29 30  GLUCOSE 101* 92  BUN 13 11  CREATININE 0.88 0.71  CALCIUM 9.1 9.0  PROT 6.3*  --   ALBUMIN 3.6  --   AST 25  --   ALT 15  --   ALKPHOS 45  --   BILITOT 0.3  --   GFRNONAA >60 >60  GFRAA >60 >60  ANIONGAP 8 9     Hematology Recent Labs Lab 07/23/16 1345  WBC  5.3  RBC 3.96  HGB 11.1*  HCT 35.1*  MCV 88.6  MCH 28.0  MCHC 31.6  RDW 13.4  PLT 339    Cardiac Enzymes Recent Labs Lab 07/23/16 1642 07/23/16 1833 07/23/16 2134  TROPONINI <0.03 <0.03 <0.03    Recent Labs Lab 07/23/16 1410  TROPIPOC 0.00     BNP Recent Labs Lab 07/23/16 1345  BNP 95.2     DDimer No results for input(s): DDIMER in the last 168 hours.   Radiology    Dg Chest 2 View  Result Date: 07/23/2016 CLINICAL DATA:  Chest tightness.  Shortness breath. EXAM: CHEST  2 VIEW COMPARISON:  08/12/2015. FINDINGS: Mediastinum hilar structures normal. Lungs are clear. Cardiomegaly with normal pulmonary vascularity. No pleural effusion or pneumothorax. Sliding hiatal hernia. IMPRESSION: 1. Cardiomegaly with normal pulmonary vascularity.  No CHF. No acute pulmonary disease. 2. Sliding hiatal hernia . Chest is stable from prior study of 08/12/2015 . Electronically Signed   By: Maisie Fushomas  Register   On: 07/23/2016 14:15    Cardiac Studies   Pending lexiscan today   Patient Profile     Susan Brady is a 71 y.o. female with a hx of DM, HTN, hyperlipidemia, mild anemia by prior labs, hiatal hernia, gastric ulcer whom we are asked to see by Susan Brady for chest pain.  Assessment & Plan    1. Chest pain - troponin x 3 negative. EKG with new TWI today. No further chest pain. Pending lexiscan today. Continue ASA and statin.   2. HTN - Antihypertensive on hold for soft BP. Intermittently elevated today. Consider resuming later today.   3. HLD - 07/24/2016: Cholesterol 111; HDL 56; LDL Cholesterol 42; Triglycerides 66; VLDL 13  - Continue statin  Signed, Bhagat,Bhavinkumar, PA  07/24/2016, 9:37 AM    History and all data above reviewed.  Patient examined.  I agree with the findings as above .  she has not had further chest pain.  However, she has had palpitations.  She had bigeminy briefly on tele. The patient exam reveals COR:RRR  ,  Lungs: Clear  ,  Abd: Positive bowel  sounds, no rebound no guarding, Ext no edema  .  All available labs, radiology testing, previous records reviewed. Agree with documented assessment and plan. Chest pain:  No objective evidence of ischemia.  Home if Lexiscan Myoview is negative.  I would not suggest a change in her meds with her palpitations.  She has baseline bradycardia.    Susan FearingJames Ashe Brady  11:19 AM  07/24/2016

## 2016-07-25 NOTE — Plan of Care (Signed)
Problem: Physical Regulation: Goal: Ability to maintain clinical measurements within normal limits will improve Outcome: Completed/Met Date Met: 07/25/16 Pt educated on physical limitations & restrictions due to recent cath. Pt also educated on the importance of hand hygiene & how to prevent infection.   Problem: Skin Integrity: Goal: Risk for impaired skin integrity will decrease Outcome: Completed/Met Date Met: 07/25/16 Pt educated on how to keep her skin dry & intact. Pt educated on the importance of keeping her groins as well as under her breast folds dry. Pt educated on applying the nystatin powder as we have here in the hospital.  Problem: Nutrition: Goal: Adequate nutrition will be maintained Outcome: Completed/Met Date Met: 07/25/16 Pt educated on the importance of maintaining a low sodium heart healthy diet.   Problem: Education: Goal: Understanding of cardiac disease, CV risk reduction, and recovery process will improve Outcome: Completed/Met Date Met: 07/25/16 Pt educated on rt wrist restrictions & site care. Pt also educated on when to call the MD & what steps to follow if angina occurs.   Problem: Cardiovascular: Goal: Vascular access site(s) Level 0-1 will be maintained Outcome: Completed/Met Date Met: 07/25/16 Pt educated on site care as far as keeping it clean, dry & restricting use. Pt also educated on monitoring the site for infection & when to call MD. Pt informed she can take the bandage off tomorrow around noon.

## 2016-07-25 NOTE — Telephone Encounter (Signed)
TCM Phone Call-Please call pt-Appt on 08-07-16 with Corine ShelterLuke Kilroy

## 2016-07-25 NOTE — Telephone Encounter (Signed)
Patient still in hospital 07/25/16

## 2016-07-25 NOTE — Progress Notes (Signed)
Spoke with cardiology PA [facility] about holding pt's lopressor d/t hr being in the 40s. Per PA okay to hold. Pt is back from cath. Will continue to monitor the pt. Sanda LingerMilam, Etoile Looman R, RN

## 2016-07-26 MED FILL — Heparin Sodium (Porcine) 2 Unit/ML in Sodium Chloride 0.9%: INTRAMUSCULAR | Qty: 500 | Status: AC

## 2016-07-26 NOTE — Telephone Encounter (Signed)
LMTCB for TOC outreach attempt #1 Discharged 07/25/16

## 2016-07-26 NOTE — Telephone Encounter (Signed)
Patient/daughter Susan Brady (DPR) contacted [facility] July [facility].  Patient understands to follow up with provider Franky MachoLuke, [place] on 08/07/2016 at [facility]30[facility]. Patient understands discharge instructions? YES Patient understands medications and regiment? YES Patient understands to bring all medications to this visit? YES  \

## 2016-07-26 NOTE — Telephone Encounter (Signed)
Returning your call from this morning. °

## 2016-08-01 ENCOUNTER — Other Ambulatory Visit: Payer: Self-pay | Admitting: Internal Medicine

## 2016-08-01 ENCOUNTER — Ambulatory Visit: Payer: Medicare Other | Admitting: Cardiology

## 2016-08-01 NOTE — Telephone Encounter (Signed)
Not on current med list. Please advise 

## 2016-08-07 ENCOUNTER — Ambulatory Visit (INDEPENDENT_AMBULATORY_CARE_PROVIDER_SITE_OTHER): Payer: Medicare Other | Admitting: Cardiology

## 2016-08-07 ENCOUNTER — Encounter: Payer: Self-pay | Admitting: Cardiology

## 2016-08-07 VITALS — BP 126/60 | HR 64 | Ht 66.0 in | Wt 210.0 lb

## 2016-08-07 DIAGNOSIS — R001 Bradycardia, unspecified: Secondary | ICD-10-CM

## 2016-08-07 DIAGNOSIS — I10 Essential (primary) hypertension: Secondary | ICD-10-CM

## 2016-08-07 DIAGNOSIS — Z0389 Encounter for observation for other suspected diseases and conditions ruled out: Secondary | ICD-10-CM | POA: Diagnosis not present

## 2016-08-07 DIAGNOSIS — I2510 Atherosclerotic heart disease of native coronary artery without angina pectoris: Secondary | ICD-10-CM

## 2016-08-07 DIAGNOSIS — R0789 Other chest pain: Secondary | ICD-10-CM

## 2016-08-07 DIAGNOSIS — I517 Cardiomegaly: Secondary | ICD-10-CM | POA: Diagnosis not present

## 2016-08-07 DIAGNOSIS — R079 Chest pain, unspecified: Secondary | ICD-10-CM

## 2016-08-07 DIAGNOSIS — E119 Type 2 diabetes mellitus without complications: Secondary | ICD-10-CM

## 2016-08-07 DIAGNOSIS — IMO0001 Reserved for inherently not codable concepts without codable children: Secondary | ICD-10-CM | POA: Insufficient documentation

## 2016-08-07 NOTE — Assessment & Plan Note (Signed)
Admitted with chest pain 07/23/16 with a history of an [place] ago [place]- Abnormal Myoview but normal coronaries at cath

## 2016-08-07 NOTE — Patient Instructions (Signed)
Corine ShelterLuke Kilroy, [place]-C, recommends that you continue on your current medications as directed. Please refer to the Current Medication list given to you today.  Your physician has requested that you have an echocardiogram. Echocardiography is a painless test that uses sound waves to create images of your heart. It provides your doctor with information about the size and shape of your heart and how well your heart's chambers and valves are working. This procedure takes approximately one hour. There are no restrictions for this procedure.  Franky MachoLuke recommends that you schedule a follow-up appointment after your echocardiogram with Dr Antoine PocheHochrein.  If you need a refill on your cardiac medications before your next appointment, please call your pharmacy.

## 2016-08-07 NOTE — Assessment & Plan Note (Signed)
Noted on CXR. EF was 75% by Myoview, no LVG done at cath- Check echo

## 2016-08-07 NOTE — Progress Notes (Signed)
08/07/2016 Susan BeachBetty Brady   [birth year]  [id number]  Primary Physician Susan Brady, Susan MoStacy J, MD Primary Cardiologist: Dr Antoine PocheHochrein  HPI:  71 y/o AA female with a history of NIDDM (recently on oral agents but not currently), HLD, HTN, and a history of an [place] in [place] 12 years ago. She presented 07/23/16 with chest pain, bradycardia, and hypotension, (BNP 92). Her CXR did show CM. Her medications were adjusted. Myoview was abnormal and she had a cath 07/25/16 that showed normal coronaries. She is in the office today with her daughter for follow up. She has had no further chest pain. Her VS are stable.    Current Outpatient Prescriptions  Medication Sig Dispense Refill  . acetaminophen (TYLENOL) 500 MG tablet Take 500-1,000 mg by mouth every 6 (six) hours as needed for mild pain.    Marland Kitchen. aspirin EC 81 MG tablet Take 1 tablet (81 mg total) by mouth daily.    Marland Kitchen. atorvastatin (LIPITOR) 20 MG tablet TAKE 1 TABLET(20 MG) BY MOUTH DAILY 90 tablet 1  . cholecalciferol (VITAMIN D) 1000 UNITS tablet Take 1,000 Units by mouth daily.     . Garlic 1000 MG CAPS Take 1,000 mg by mouth daily.    . hydrALAZINE (APRESOLINE) 50 MG tablet TAKE 1 AND 1/2 TABLETS BY MOUTH THREE TIMES DAILY 405 tablet 1  . hydrochlorothiazide (HYDRODIURIL) 25 MG tablet TAKE 1 TABLET(25 MG) BY MOUTH DAILY 90 tablet 3  . metoprolol tartrate (LOPRESSOR) 25 MG tablet Take 1 tablet (25 mg total) by mouth daily.    Marland Kitchen. nystatin (NYSTATIN) powder APPLY TO ABDOMEN FOLDS AND UNDER BREAST 2-3 TIMES DAILY 60 g 5  . omega-3 acid ethyl esters (LOVAZA) 1 G capsule Take 1 g by mouth daily.     Marland Kitchen. omeprazole (PRILOSEC) 20 MG capsule Take 1 capsule (20 mg total) by mouth daily. (Patient taking differently: Take 20 mg by mouth daily as needed (acid reflux). ) 30 capsule 3  . vitamin E 400 UNIT capsule Take 400 Units by mouth daily.     No current facility-administered medications for this visit.     Allergies  Allergen Reactions  . Lisinopril Anaphylaxis   cough    Past Medical History:  Diagnosis Date  . Anemia   . CHF (congestive heart failure) (HCC)   . Diabetes mellitus without complication (HCC)   . Gastric ulcer   . Glaucoma   . Hiatal hernia   . Hyperlipidemia   . Hypertension     Social History   Social History  . Marital status: Divorced    Spouse name: N/A  . Number of children: N/A  . Years of education: N/A   Occupational History  . Not on file.   Social History Main Topics  . Smoking status: Former Games developermoker  . Smokeless tobacco: Never Used  . Alcohol use No  . Drug use: No  . Sexual activity: Not on file   Other Topics Concern  . Not on file   Social History Narrative  . No narrative on file     Family History  Problem Relation Age of Onset  . Heart disease Mother   . Alcohol abuse Father   . Alcohol abuse Sister   . Diabetes Sister   . Hypertension Sister   . Cancer Sister   . CAD Sister        One sister with stents  . Alcohol abuse Brother   . Diabetes Brother   . Hypertension Brother  Review of Systems: General: negative for chills, fever, night sweats or weight changes.  Cardiovascular: negative for chest pain, dyspnea on exertion, edema, orthopnea, palpitations, paroxysmal nocturnal dyspnea or shortness of breath Dermatological: negative for rash Respiratory: negative for cough or wheezing Urologic: negative for hematuria Abdominal: negative for nausea, vomiting, diarrhea, bright red blood per rectum, melena, or hematemesis Neurologic: negative for visual changes, syncope, or dizziness All other systems reviewed and are otherwise negative except as noted above.    Blood pressure 126/60, pulse 64, height 5\' 6"  (1.676 m), weight 210 lb (95.3 kg).  General appearance: alert, cooperative and no distress Neck: no carotid bruit and no JVD Lungs: clear to auscultation bilaterally Heart: regular rate and rhythm Extremities: extremities normal, atraumatic, no cyanosis or edema Skin:  Skin color, texture, turgor normal. No rashes or lesions Neurologic: Grossly normal   ASSESSMENT AND PLAN:   Chest pain of unknown etiology Admitted with chest pain 07/23/16 with a history of an MI 12 years ago in [place]- Abnormal Myoview but normal coronaries at cath  Essential hypertension Controlled on current medications but these may need to be adjusted pending echocardiogram results  Bradycardia Beta blocker decreased during recent admission  Cardiomegaly Noted on CXR. EF was 75% by Myoview, no LVG done at cath- Check echo   PLAN  I suggested we obtain an echo cardiogram as her CXR suggested CM (though no LVH on EKG). Based on this we may want to adjust her hypertension Rx. She'll f/u with Dr Antoine Poche after the echo.   Corine Shelter PA-C 08/07/2016 9:02 AM

## 2016-08-07 NOTE — Assessment & Plan Note (Signed)
Controlled on current medications but these may need to be adjusted pending echocardiogram results

## 2016-08-07 NOTE — Assessment & Plan Note (Signed)
Beta blocker decreased during recent admission

## 2016-08-12 ENCOUNTER — Other Ambulatory Visit: Payer: Self-pay | Admitting: Internal Medicine

## 2016-08-14 ENCOUNTER — Other Ambulatory Visit: Payer: Self-pay | Admitting: Internal Medicine

## 2016-08-15 NOTE — Telephone Encounter (Signed)
Not on current med list, please advise

## 2016-08-16 ENCOUNTER — Other Ambulatory Visit: Payer: Self-pay | Admitting: Emergency Medicine

## 2016-08-16 MED ORDER — HYDROCHLOROTHIAZIDE 25 MG PO TABS: ORAL_TABLET | ORAL | 1 refills | Status: DC

## 2016-08-20 ENCOUNTER — Ambulatory Visit (HOSPITAL_COMMUNITY): Payer: Medicare Other | Attending: Cardiovascular Disease

## 2016-08-20 ENCOUNTER — Other Ambulatory Visit: Payer: Self-pay

## 2016-08-20 DIAGNOSIS — I10 Essential (primary) hypertension: Secondary | ICD-10-CM | POA: Diagnosis not present

## 2016-08-20 DIAGNOSIS — E119 Type 2 diabetes mellitus without complications: Secondary | ICD-10-CM | POA: Diagnosis not present

## 2016-08-20 DIAGNOSIS — I071 Rheumatic tricuspid insufficiency: Secondary | ICD-10-CM | POA: Insufficient documentation

## 2016-08-20 DIAGNOSIS — Z87891 Personal history of nicotine dependence: Secondary | ICD-10-CM | POA: Diagnosis not present

## 2016-08-20 DIAGNOSIS — E785 Hyperlipidemia, unspecified: Secondary | ICD-10-CM | POA: Diagnosis not present

## 2016-08-20 DIAGNOSIS — I509 Heart failure, unspecified: Secondary | ICD-10-CM | POA: Diagnosis not present

## 2016-08-20 DIAGNOSIS — I517 Cardiomegaly: Secondary | ICD-10-CM | POA: Diagnosis not present

## 2016-08-20 DIAGNOSIS — R079 Chest pain, unspecified: Secondary | ICD-10-CM | POA: Insufficient documentation

## 2016-08-20 DIAGNOSIS — E669 Obesity, unspecified: Secondary | ICD-10-CM | POA: Diagnosis not present

## 2016-08-20 MED ORDER — PERFLUTREN LIPID MICROSPHERE
1.0000 mL | INTRAVENOUS | Status: AC | PRN
  Administered 2016-08-20: 3 mL via INTRAVENOUS

## 2016-08-21 NOTE — Progress Notes (Signed)
Cardiology Office Note   Date:  08/22/2016   ID:  Susan Brady Such, DOB [birth year], MRN [id number]  PCP:  Pincus SanesBurns, Stacy J, MD  Cardiologist:   Rollene RotundaJames Ashleyann Shoun, MD    Chief Complaint  Patient presents with  . Appointment     having moments of heart fluttering.      History of Present Illness: Susan Brady Mault is a 71 y.o. female who presents for follow up after a hospitalization for chest pain.  She did have an abnormal Lexiscan Myoview.  However she had normal coronaries on cath.  She did have cardiomegaly on CXR but had an echo after the last office appt with NL LV function.  There was grade two diastolic dysfunction.    Since I last saw her she has done well.  The patient denies any new symptoms such as chest discomfort, neck or arm discomfort. There has been no new shortness of breath, PND or orthopnea. There have been no reported palpitations, presyncope or syncope.  She is walking the dog once or twice daily without problems.     Past Medical History:  Diagnosis Date  . Anemia   . CHF (congestive heart failure) (HCC)   . Diabetes mellitus without complication (HCC)   . Gastric ulcer   . Glaucoma   . Hiatal hernia   . Hyperlipidemia   . Hypertension     Past Surgical History:  Procedure Laterality Date  . LEFT HEART CATH AND CORONARY ANGIOGRAPHY N/A 07/25/2016   Procedure: Left Heart Cath and Coronary Angiography;  Surgeon: Kathleene HazelMcAlhany, Christopher D, MD;  [facility]VAS[facility] CV LAB;  Service: Cardiovascular;  Laterality: N/A;     Current Outpatient Prescriptions  Medication Sig Dispense Refill  . acetaminophen (TYLENOL) 500 MG tablet Take 500-1,000 mg by mouth every 6 (six) hours as needed for mild pain.    Marland Kitchen. aspirin EC 81 MG tablet Take 1 tablet (81 mg total) by mouth daily.    Marland Kitchen. atorvastatin (LIPITOR) 20 MG tablet TAKE 1 TABLET(20 MG) BY MOUTH DAILY 90 tablet 1  . cholecalciferol (VITAMIN D) 1000 UNITS tablet Take 1,000 Units by mouth daily.     . furosemide (LASIX) 20 MG  tablet TAKE 1 TABLET(20 MG) BY MOUTH TWICE DAILY 180 tablet 0  . Garlic 1000 MG CAPS Take 1,000 mg by mouth daily.    . hydrALAZINE (APRESOLINE) 50 MG tablet TAKE 1 AND 1/2 TABLETS BY MOUTH THREE TIMES DAILY 405 tablet 1  . hydrochlorothiazide (HYDRODIURIL) 25 MG tablet TAKE 1 TABLET(25 MG) BY MOUTH DAILY 90 tablet 1  . metoprolol tartrate (LOPRESSOR) 25 MG tablet Take 1 tablet (25 mg total) by mouth daily.    Marland Kitchen. nystatin (NYSTATIN) powder APPLY TO ABDOMEN FOLDS AND UNDER BREAST 2-3 TIMES DAILY 60 g 5  . omega-3 acid ethyl esters (LOVAZA) 1 G capsule Take 1 g by mouth daily.     Marland Kitchen. omeprazole (PRILOSEC) 20 MG capsule Take 1 capsule (20 mg total) by mouth daily. (Patient taking differently: Take 20 mg by mouth daily as needed (acid reflux). ) 30 capsule 3  . vitamin E 400 UNIT capsule Take 400 Units by mouth daily.     No current facility-administered medications for this visit.     Allergies:   Lisinopril    ROS:  Please see the history of present illness.   Otherwise, review of systems are positive for none.   All other systems are reviewed and negative.    PHYSICAL EXAM: VS:  BP  122/62   Pulse 60   Ht 5\' 6"  (1.676 m)   Wt 210 lb (95.3 kg)   BMI 33.89 kg/m  , BMI Body mass index is 33.89 kg/m. GENERAL:  Well appearing NECK:  No jugular venous distention, waveform within normal limits, carotid upstroke brisk and symmetric, no bruits, no thyromegaly LUNGS:  Clear to auscultation bilaterally CHEST:  Unremarkable HEART:  PMI not displaced or sustained,S1 and S2 within normal limits, no S3, no S4, no clicks, no rubs, no murmurs ABD:  Flat, positive bowel sounds normal in frequency in pitch, no bruits, no rebound, no guarding, no midline pulsatile mass, no hepatomegaly, no splenomegaly EXT:  2 plus pulses throughout, mild left ankle edema, no cyanosis no clubbing    EKG:  EKG is not ordered today.    Recent Labs: 07/23/2016: ALT 15; B Natriuretic Peptide 95.2; TSH 0.549 07/25/2016:  BUN 7; Creatinine, Ser 0.71; Hemoglobin 10.9; Magnesium 2.2; Platelets 306; Potassium 3.8; Sodium 141    Lipid Panel    Component Value Date/Time   CHOL 111 07/24/2016 0254   TRIG 66 07/24/2016 0254   HDL 56 07/24/2016 0254   CHOLHDL 2.0 07/24/2016 0254   VLDL 13 07/24/2016 0254   LDLCALC 42 07/24/2016 0254      Wt Readings from Last 3 Encounters:  08/22/16 210 lb (95.3 kg)  08/07/16 210 lb (95.3 kg)  07/23/16 208 lb 6.4 oz (94.5 kg)      Other studies Reviewed: Additional studies/ records that were reviewed today include: Echo. Review of the above records demonstrates:  Please see elsewhere in the note.     ASSESSMENT AND PLAN:   Chest pain of unknown etiology No further cardiac work up is indicated.  Non anginal chest pain.    Essential hypertension The blood pressure is at target. No change in medications is indicated. We will continue with therapeutic lifestyle changes (TLC).  Bradycardia She is doing well.  Beta blocker decreased during recent admission.  No change in therapy.   Cardiomegaly Echo with NL EF.  No further work up.   Current medicines are reviewed at length with the patient today.  The patient does not have concerns regarding medicines.  The following changes have been made:  no change  Labs/ tests ordered today include: None No orders of the defined types were placed in this encounter.    Disposition:   FU with me as needed.     Signed, Rollene Rotunda, MD  08/22/2016 9:34 AM    [facility]

## 2016-08-22 ENCOUNTER — Encounter: Payer: Self-pay | Admitting: Cardiology

## 2016-08-22 ENCOUNTER — Ambulatory Visit (INDEPENDENT_AMBULATORY_CARE_PROVIDER_SITE_OTHER): Payer: Medicare Other | Admitting: Cardiology

## 2016-08-22 VITALS — BP 122/62 | HR 60 | Ht 66.0 in | Wt 210.0 lb

## 2016-08-22 DIAGNOSIS — I10 Essential (primary) hypertension: Secondary | ICD-10-CM

## 2016-08-22 DIAGNOSIS — R079 Chest pain, unspecified: Secondary | ICD-10-CM

## 2016-08-22 DIAGNOSIS — I2510 Atherosclerotic heart disease of native coronary artery without angina pectoris: Secondary | ICD-10-CM | POA: Diagnosis not present

## 2016-08-22 NOTE — Patient Instructions (Signed)
Medication Instructions:  Your physician recommends that you continue on your current medications as directed. Please refer to the Current Medication list given to you today.  Follow-Up: Your physician wants you to follow-up AS NEEDED.   Any Other Special Instructions Will Be Listed Below (If Applicable).     If you need a refill on your cardiac medications before your next appointment, please call your pharmacy.

## 2016-10-24 ENCOUNTER — Other Ambulatory Visit: Payer: Self-pay | Admitting: Internal Medicine

## 2016-11-10 ENCOUNTER — Other Ambulatory Visit: Payer: Self-pay | Admitting: Internal Medicine

## 2016-11-11 NOTE — Telephone Encounter (Signed)
Routing to dr burns, last potassium lab was during hospital encounter in June/2018---potassium level on low normal side---do you need office visit or labs before refilling, please advise, thanks

## 2016-11-11 NOTE — Telephone Encounter (Signed)
She should come in for a follow up - this had been d/c'd during her hospital stay in June  - we may need to revise her medication since she is on two water pills.  Ok to refill for a few days if she needs it until she gets in here.

## 2016-11-12 NOTE — Telephone Encounter (Signed)
Left message asking patient to call back to schedule appt to see dr burns----when patient calls back and schedules appt---I can send small amt of lasix in to her pharm until she is seen if she reallly needs it, but we need to see her as soon as possible to make sure her potassium level is not severely depleted

## 2016-11-13 ENCOUNTER — Other Ambulatory Visit: Payer: Self-pay | Admitting: Emergency Medicine

## 2016-11-13 MED ORDER — FUROSEMIDE 20 MG PO TABS: ORAL_TABLET | ORAL | 0 refills | Status: DC

## 2016-12-02 ENCOUNTER — Ambulatory Visit (INDEPENDENT_AMBULATORY_CARE_PROVIDER_SITE_OTHER)
Admission: RE | Admit: 2016-12-02 | Discharge: 2016-12-02 | Disposition: A | Discharge status: Home / Self Care | Payer: Medicare Other | Source: Ambulatory Visit | Attending: Family Medicine | Admitting: Family Medicine

## 2016-12-02 ENCOUNTER — Encounter: Payer: Self-pay | Admitting: Family Medicine

## 2016-12-02 ENCOUNTER — Ambulatory Visit (INDEPENDENT_AMBULATORY_CARE_PROVIDER_SITE_OTHER): Payer: Medicare Other | Admitting: Family Medicine

## 2016-12-02 VITALS — BP 136/74 | HR 72 | Temp 98.5°F | Ht 66.0 in | Wt 207.0 lb

## 2016-12-02 DIAGNOSIS — M545 Low back pain, unspecified: Secondary | ICD-10-CM

## 2016-12-02 DIAGNOSIS — M25512 Pain in left shoulder: Secondary | ICD-10-CM

## 2016-12-02 MED ORDER — NAPROXEN 500 MG PO TABS: 500.0000 mg | ORAL_TABLET | Freq: Two times a day (BID) | ORAL | 0 refills | Status: DC

## 2016-12-02 NOTE — Patient Instructions (Signed)
Thank you for coming in,   Please take the naproxen for 10 days straight and then as needed.   Please follow up with me if there is no improvement.   We will call you with the results from today.    Please feel free to call with any questions or concerns at any time, at [phone]. --Dr. Jordan Likes

## 2016-12-02 NOTE — Progress Notes (Signed)
Susan Brady - 71 y.o. female MRN [id number]  Date of birth: Jun 03, 1945  SUBJECTIVE:  Including CC & ROS.  Chief Complaint  Patient presents with  . Fall    tripped over her garbage can lid and fell on her left arm and left knee-states her left shoulder and hip ache    Susan Brady is a 71 year old female is presenting with left shoulder pain and lower back pain. She took the lid off of her trash can and then stepped and which caused her to fall on her buttock and left side. Since that time she's been having pain in her lower back and left shoulder. This occurred 2 days ago. The pain is a 6 out of 10. She denies any numbness or weakness. Pain seems to be staying the same. No prior history of surgery on her left hip, back, or shoulder. Pain seems to be worse with certain movements. Pain is localized with no radicular symptoms. She denies any swelling of her knee, back, or shoulder. Does not have any bruising occurring.     Review of Systems  Constitutional: Negative for fever.  Musculoskeletal: Positive for back pain. Negative for gait problem.  Skin: Negative for color change.  Neurological: Negative for weakness and numbness.  Hematological: Negative for adenopathy.    HISTORY: Past Medical, Surgical, Social, and Family History Reviewed & Updated per EMR.   Pertinent Historical Findings include:  Past Medical History:  Diagnosis Date  . Anemia   . CHF (congestive heart failure) (HCC)   . Diabetes mellitus without complication (HCC)   . Gastric ulcer   . Glaucoma   . Hiatal hernia   . Hyperlipidemia   . Hypertension     Past Surgical History:  Procedure Laterality Date  . LEFT HEART CATH AND CORONARY ANGIOGRAPHY N/A 07/25/2016   Procedure: Left Heart Cath and Coronary Angiography;  Surgeon: Kathleene Hazel, MD;  Location: [facility] INVASIVE CV LAB;  Service: Cardiovascular;  Laterality: N/A;    Allergies  Allergen Reactions  . Lisinopril Anaphylaxis    cough    Family  History  Problem Relation Age of Onset  . Heart disease Mother   . Alcohol abuse Father   . Alcohol abuse Sister   . Diabetes Sister   . Hypertension Sister   . Cancer Sister   . CAD Sister        One sister with stents  . Alcohol abuse Brother   . Diabetes Brother   . Hypertension Brother      Social History   Social History  . Marital status: Divorced    Spouse name: N/A  . Number of children: N/A  . Years of education: N/A   Occupational History  . Not on file.   Social History Main Topics  . Smoking status: Former Games developer  . Smokeless tobacco: Never Used  . Alcohol use No  . Drug use: No  . Sexual activity: Not on file   Other Topics Concern  . Not on file   Social History Narrative  . No narrative on file     PHYSICAL EXAM:  VS: BP 136/74 (BP Location: Left Arm, Patient Position: Sitting, Cuff Size: Normal)   Pulse 72   Temp 98.5 F (36.9 C) (Oral)   Ht  (1.676 m)   Wt 207 lb (93.9 kg)   SpO2 99%   BMI 33.41 kg/m  Physical Exam Gen: NAD, alert, cooperative with exam, well-appearing ENT: normal lips, normal nasal mucosa,  Eye:  normal EOM, normal conjunctiva and lids CV:  no edema, +2 pedal pulses   Resp: no accessory muscle use, non-labored,  Skin: no rashes, no areas of induration  Neuro: normal tone, normal sensation to touch Psych:  normal insight, alert and oriented MSK:  Left shoulder: Generalized tenderness to palpation over the anterior lateral shoulder. Normal active flexion and abduction. Normal external rotation. Normal strength resistance with internal and external rotation Normal empty can testing. Normal grip strength. Back: No significant tenderness to the thoracic midline or lumbar midline Symptoms to palpation on the left paraspinal muscle. Normal gait. Negative straight leg raise bilaterally. Normal strength with hip flexion. Neurovascularly intact     ASSESSMENT & PLAN:   Left shoulder pain Most likely  contusion of the shoulder. Rotator cuff appears to be intact - Naproxen provided - Advised to follow-up with no improvement can consider ultrasound, imaging, and/or referral to physical therapy   Acute left-sided low back pain without sciatica Fell on her buttock and now having some lower back pain with no radicular symptoms. Independent review of her CT abdomen pelvis from 2017 shows some facet arthritis. Likely this is a contusion with possible exacerbation of some underlying arthritis. - Naproxen provided. - X-rays - Ice encouraged - Follow-up if no improvement consider physical therapy.

## 2016-12-03 DIAGNOSIS — M545 Low back pain, unspecified: Secondary | ICD-10-CM | POA: Insufficient documentation

## 2016-12-03 NOTE — Assessment & Plan Note (Signed)
Fell on her buttock and now having some lower back pain with no radicular symptoms. Independent review of her CT abdomen pelvis from 2017 shows some facet arthritis. Likely this is a contusion with possible exacerbation of some underlying arthritis. - Naproxen provided. - X-rays - Ice encouraged - Follow-up if no improvement consider physical therapy.

## 2016-12-03 NOTE — Assessment & Plan Note (Signed)
Most likely contusion of the shoulder. Rotator cuff appears to be intact - Naproxen provided - Advised to follow-up with no improvement can consider ultrasound, imaging, and/or referral to physical therapy

## 2016-12-05 ENCOUNTER — Telehealth: Payer: Self-pay | Admitting: Family Medicine

## 2016-12-05 NOTE — Telephone Encounter (Signed)
Patient called in.  Gave x ray results.

## 2016-12-20 ENCOUNTER — Ambulatory Visit (INDEPENDENT_AMBULATORY_CARE_PROVIDER_SITE_OTHER): Payer: Medicare Other | Admitting: Internal Medicine

## 2016-12-20 ENCOUNTER — Other Ambulatory Visit (INDEPENDENT_AMBULATORY_CARE_PROVIDER_SITE_OTHER): Payer: Medicare Other

## 2016-12-20 ENCOUNTER — Encounter: Payer: Self-pay | Admitting: Internal Medicine

## 2016-12-20 VITALS — BP 136/82 | HR 73 | Temp 98.1°F | Resp 20 | Ht 66.0 in | Wt 200.0 lb

## 2016-12-20 DIAGNOSIS — R109 Unspecified abdominal pain: Secondary | ICD-10-CM | POA: Diagnosis not present

## 2016-12-20 DIAGNOSIS — E2839 Other primary ovarian failure: Secondary | ICD-10-CM | POA: Diagnosis not present

## 2016-12-20 DIAGNOSIS — Z1211 Encounter for screening for malignant neoplasm of colon: Secondary | ICD-10-CM | POA: Diagnosis not present

## 2016-12-20 DIAGNOSIS — E119 Type 2 diabetes mellitus without complications: Secondary | ICD-10-CM

## 2016-12-20 DIAGNOSIS — R2689 Other abnormalities of gait and mobility: Secondary | ICD-10-CM | POA: Diagnosis not present

## 2016-12-20 DIAGNOSIS — I2510 Atherosclerotic heart disease of native coronary artery without angina pectoris: Secondary | ICD-10-CM

## 2016-12-20 DIAGNOSIS — R3989 Other symptoms and signs involving the genitourinary system: Secondary | ICD-10-CM

## 2016-12-20 DIAGNOSIS — Z0000 Encounter for general adult medical examination without abnormal findings: Secondary | ICD-10-CM | POA: Diagnosis not present

## 2016-12-20 DIAGNOSIS — I10 Essential (primary) hypertension: Secondary | ICD-10-CM

## 2016-12-20 DIAGNOSIS — Z1231 Encounter for screening mammogram for malignant neoplasm of breast: Secondary | ICD-10-CM | POA: Diagnosis not present

## 2016-12-20 DIAGNOSIS — R10A1 Flank pain, right side: Secondary | ICD-10-CM

## 2016-12-20 DIAGNOSIS — Z23 Encounter for immunization: Secondary | ICD-10-CM | POA: Diagnosis not present

## 2016-12-20 LAB — COMPREHENSIVE METABOLIC PANEL
ALT: 13 U/L (ref 0–35)
AST: 18 U/L (ref 0–37)
Albumin: 4.5 g/dL (ref 3.5–5.2)
Alkaline Phosphatase: 47 U/L (ref 39–117)
BILIRUBIN TOTAL: 0.4 mg/dL (ref 0.2–1.2)
BUN: 19 mg/dL (ref 6–23)
CALCIUM: 10 mg/dL (ref 8.4–10.5)
CO2: 36 meq/L — AB (ref 19–32)
CREATININE: 0.65 mg/dL (ref 0.40–1.20)
Chloride: 98 mEq/L (ref 96–112)
GFR: 115.17 mL/min (ref >60.00)
GLUCOSE: 105 mg/dL — AB (ref 70–99)
Potassium: 3 mEq/L — ABNORMAL LOW (ref 3.5–5.1)
SODIUM: 141 meq/L (ref 135–145)
TOTAL PROTEIN: 7.8 g/dL (ref 6.0–8.3)

## 2016-12-20 LAB — URINALYSIS, ROUTINE W REFLEX MICROSCOPIC
BILIRUBIN URINE: NEGATIVE
Hgb urine dipstick: NEGATIVE
KETONES UR: NEGATIVE
LEUKOCYTES UA: NEGATIVE
Nitrite: NEGATIVE
PH: 6 (ref 5.0–8.0)
SPECIFIC GRAVITY, URINE: 1.025 (ref 1.000–1.030)
TOTAL PROTEIN, URINE-UPE24: NEGATIVE
URINE GLUCOSE: NEGATIVE
UROBILINOGEN UA: 1 (ref 0.0–1.0)

## 2016-12-20 LAB — LIPID PANEL
CHOL/HDL RATIO: 2
Cholesterol: 113 mg/dL (ref 0–200)
HDL: 51.9 mg/dL (ref >39.00)
LDL Cholesterol: 44 mg/dL (ref 0–99)
NONHDL: 61.2
TRIGLYCERIDES: 87 mg/dL (ref 0.0–149.0)
VLDL: 17.4 mg/dL (ref 0.0–40.0)

## 2016-12-20 LAB — HEMOGLOBIN A1C: HEMOGLOBIN A1C: 6.1 % (ref 4.6–6.5)

## 2016-12-20 LAB — MICROALBUMIN / CREATININE URINE RATIO
Creatinine,U: 151.7 mg/dL
Microalb Creat Ratio: 1.5 mg/g (ref 0.0–30.0)
Microalb, Ur: 2.2 mg/dL — ABNORMAL HIGH (ref 0.0–1.9)

## 2016-12-20 MED ORDER — CIPROFLOXACIN HCL 250 MG PO TABS: 250.0000 mg | ORAL_TABLET | Freq: Two times a day (BID) | ORAL | 0 refills | Status: DC

## 2016-12-20 NOTE — Assessment & Plan Note (Signed)
Associated with change in urine color and odor Will check UA, UCx Will start empiric antibiotics

## 2016-12-20 NOTE — Patient Instructions (Addendum)
Please ask ophthalmologist to send eye exam report  Continue doing brain stimulating activities (puzzles, reading, adult coloring books, staying active) to keep memory sharp.   Continue to eat heart healthy diet (full of fruits, vegetables, whole grains, lean protein, water--limit salt, fat, and sugar intake) and increase physical activity as tolerated.   Susan Brady , Thank you for taking time to come for your Medicare Wellness Visit. I appreciate your ongoing commitment to your health goals. Please review the following plan we discussed and let me know if I can assist you in the future.   These are the goals we discussed: Goals    . Take time daily to relax and have time for myself       This is a list of the screening recommended for you and due dates:  Health Maintenance  Topic Date Due  . Eye exam for diabetics  10/15/1954  . Tetanus Vaccine  10/15/1963  . Colon Cancer Screening  10/15/1994  . DEXA scan (bone density measurement)  10/14/2009  . Mammogram  09/17/2014  . Pneumonia vaccines (2 of 2 - PPSV23) 03/23/2016  . Flu Shot  09/18/2016  . Complete foot exam   12/25/2016  . Urine Protein Check  12/25/2016  . Hemoglobin A1C  12/26/2016  .  Hepatitis C: One time screening is recommended by Center for Disease Control  (CDC) for  adults born from [birth year] through 1966.   Completed   Influenza Virus Vaccine injection What is this medicine? INFLUENZA VIRUS VACCINE (in floo EN zuh VAHY ruhs vak SEEN) helps to reduce the risk of getting influenza also known as the flu. The vaccine only helps protect you against some strains of the flu. This medicine may be used for other purposes; ask your health care provider or pharmacist if you have questions. COMMON BRAND NAME(S): Afluria, Agriflu, Alfuria, FLUAD, Fluarix, Fluarix Quadrivalent, Flublok, Flublok Quadrivalent, FLUCELVAX, Flulaval, Fluvirin, Fluzone, Fluzone High-Dose, Fluzone Intradermal What should I tell my health care provider  before I take this medicine? They need to know if you have any of these conditions: -bleeding disorder like hemophilia -fever or infection -Guillain-Barre syndrome or other neurological problems -immune system problems -infection with the human immunodeficiency virus (HIV) or AIDS -low blood platelet counts -multiple sclerosis -an unusual or allergic reaction to influenza virus vaccine, latex, other medicines, foods, dyes, or preservatives. Different brands of vaccines contain different allergens. Some may contain latex or eggs. Talk to your doctor about your allergies to make sure that you get the right vaccine. -pregnant or trying to get pregnant -breast-feeding How should I use this medicine? This vaccine is for injection into a muscle or under the skin. It is given by a health care professional. A copy of Vaccine Information Statements will be given before each vaccination. Read this sheet carefully each time. The sheet may change frequently. Talk to your healthcare provider to see which vaccines are right for you. Some vaccines should not be used in all age groups. Overdosage: If you think you have taken too much of this medicine contact a poison control center or emergency room at once. NOTE: This medicine is only for you. Do not share this medicine with others. What if I miss a dose? This does not apply. What may interact with this medicine? -chemotherapy or radiation therapy -medicines that lower your immune system like etanercept, anakinra, infliximab, and adalimumab -medicines that treat or prevent blood clots like warfarin -phenytoin -steroid medicines like prednisone or cortisone -theophylline -vaccines This  list may not describe all possible interactions. Give your health care provider a list of all the medicines, herbs, non-prescription drugs, or dietary supplements you use. Also tell them if you smoke, drink alcohol, or use illegal drugs. Some items may interact with your  medicine. What should I watch for while using this medicine? Report any side effects that do not go away within 3 days to your doctor or health care professional. Call your health care provider if any unusual symptoms occur within 6 weeks of receiving this vaccine. You may still catch the flu, but the illness is not usually as bad. You cannot get the flu from the vaccine. The vaccine will not protect against colds or other illnesses that may cause fever. The vaccine is needed every year. What side effects may I notice from receiving this medicine? Side effects that you should report to your doctor or health care professional as soon as possible: -allergic reactions like skin rash, itching or hives, swelling of the face, lips, or tongue Side effects that usually do not require medical attention (report to your doctor or health care professional if they continue or are bothersome): -fever -headache -muscle aches and pains -pain, tenderness, redness, or swelling at the injection site -tiredness This list may not describe all possible side effects. Call your doctor for medical advice about side effects. You may report side effects to FDA at 1-800-FDA-1088. Where should I keep my medicine? The vaccine will be given by a health care professional in a clinic, pharmacy, doctor's office, or other health care setting. You will not be given vaccine doses to store at home. NOTE: This sheet is a summary. It may not cover all possible information. If you have questions about this medicine, talk to your doctor, pharmacist, or health care provider.  2018 Elsevier/Gold Standard (2014-08-26 10:07:28) Insomnia Insomnia is a sleep disorder that makes it difficult to fall asleep or to stay asleep. Insomnia can cause tiredness (fatigue), low energy, difficulty concentrating, mood swings, and poor performance at work or school. There are three different ways to classify insomnia:  Difficulty falling  asleep.  Difficulty staying asleep.  Waking up too early in the morning.  Any type of insomnia can be long-term (chronic) or short-term (acute). Both are common. Short-term insomnia usually lasts for three months or less. Chronic insomnia occurs at least three times a week for longer than three months. What are the causes? Insomnia may be caused by another condition, situation, or substance, such as:  Anxiety.  Certain medicines.  Gastroesophageal reflux disease (GERD) or other gastrointestinal conditions.  Asthma or other breathing conditions.  Restless legs syndrome, sleep apnea, or other sleep disorders.  Chronic pain.  Menopause. This may include hot flashes.  Stroke.  Abuse of alcohol, tobacco, or illegal drugs.  Depression.  Caffeine.  Neurological disorders, such as Alzheimer disease.  An overactive thyroid (hyperthyroidism).  The cause of insomnia may not be known. What increases the risk? Risk factors for insomnia include:  Gender. Women are more commonly affected than men.  Age. Insomnia is more common as you get older.  Stress. This may involve your professional or personal life.  Income. Insomnia is more common in people with lower income.  Lack of exercise.  Irregular work schedule or night shifts.  Traveling between different time zones.  What are the signs or symptoms? If you have insomnia, trouble falling asleep or trouble staying asleep is the main symptom. This may lead to other symptoms, such as:  Feeling fatigued.  Feeling nervous about going to sleep.  Not feeling rested in the morning.  Having trouble concentrating.  Feeling irritable, anxious, or depressed.  How is this treated? Treatment for insomnia depends on the cause. If your insomnia is caused by an underlying condition, treatment will focus on addressing the condition. Treatment may also include:  Medicines to help you sleep.  Counseling or therapy.  Lifestyle  adjustments.  Follow these instructions at home:  Take medicines only as directed by your health care provider.  Keep regular sleeping and waking hours. Avoid naps.  Keep a sleep diary to help you and your health care provider figure out what could be causing your insomnia. Include: ? When you sleep. ? When you wake up during the night. ? How well you sleep. ? How rested you feel the next day. ? Any side effects of medicines you are taking. ? What you eat and drink.  Make your bedroom a comfortable place where it is easy to fall asleep: ? Put up shades or special blackout curtains to block light from outside. ? Use a white noise machine to block noise. ? Keep the temperature cool.  Exercise regularly as directed by your health care provider. Avoid exercising right before bedtime.  Use relaxation techniques to manage stress. Ask your health care provider to suggest some techniques that may work well for you. These may include: ? Breathing exercises. ? Routines to release muscle tension. ? Visualizing peaceful scenes.  Cut back on alcohol, caffeinated beverages, and cigarettes, especially close to bedtime. These can disrupt your sleep.  Do not overeat or eat spicy foods right before bedtime. This can lead to digestive discomfort that can make it hard for you to sleep.  Limit screen use before bedtime. This includes: ? Watching TV. ? Using your smartphone, tablet, and computer.  Stick to a routine. This can help you fall asleep faster. Try to do a quiet activity, brush your teeth, and go to bed at the same time each night.  Get out of bed if you are still awake after 15 minutes of trying to sleep. Keep the lights down, but try reading or doing a quiet activity. When you feel sleepy, go back to bed.  Make sure that you drive carefully. Avoid driving if you feel very sleepy.  Keep all follow-up appointments as directed by your health care provider. This is important. Contact a  health care provider if:  You are tired throughout the day or have trouble in your daily routine due to sleepiness.  You continue to have sleep problems or your sleep problems get worse. Get help right away if:  You have serious thoughts about hurting yourself or someone else. This information is not intended to replace advice given to you by your health care provider. Make sure you discuss any questions you have with your health care provider. Document Released: 02/02/2000 Document Revised: 07/07/2015 Document Reviewed: 11/05/2013 Elsevier Interactive Patient Education  [employer].

## 2016-12-20 NOTE — Progress Notes (Signed)
Subjective:    Patient ID: Susan Brady, female    DOB: Oct 21, 1945, 71 y.o.   MRN: [id number]  HPI The patient is here for follow up.  CAD, Hypertension: She is taking her medication daily. She is compliant with a low sodium diet.  She denies chest pain, palpitations, edema, shortness of breath and regular headaches. She is exercising regularly.  She does not monitor her blood pressure at home.    Diabetes: She is taking her medication daily as prescribed. She is compliant with a diabetic diet. She is exercising regularly. She does not monitors her sugars.  She is up-to-date with an ophthalmology examination.   Hyperlipidemia: She is taking her medication daily. She is compliant with a low fat/cholesterol diet. She is exercising regularly. She denies myalgias.   GERD:  She is taking her medication daily as prescribed.  She denies any GERD symptoms on a regular basis but does have GERD occasionally.  She feels her GERD is well controlled.      Medications and allergies reviewed with patient and updated if appropriate.  Patient Active Problem List   Diagnosis Date Noted  . Acute left-sided low back pain without sciatica 12/03/2016  . Normal coronary arteries 08/07/2016  . Cardiomegaly 08/07/2016  . Angina at rest [facility](HCC) 07/25/2016  . Abnormal stress test   . Chest pain of unknown etiology 07/23/2016  . Bradycardia 07/23/2016  . Dupuytren's contracture of right hand 07/19/2016  . Hyperlipidemia 06/25/2016  . Neck pain on right side 11/14/2015  . Poor sleep pattern 11/14/2015  . Constipation 11/14/2015  . Hiatal hernia, large 08/30/2015  . Gastric ulcer 08/30/2015  . Anorexia 08/30/2015  . Unsteadiness 08/30/2015  . Gastritis 08/15/2015  . Left shoulder pain 06/22/2015  . Essential hypertension 11/22/2014  . Non-insulin dependent type 2 diabetes mellitus (HCC) 11/22/2014  . Arthritis 11/22/2014  . Glaucoma 11/22/2014  . GERD (gastroesophageal reflux disease) 11/22/2014  .  Varicose veins 11/22/2014  . Myocardial infarct, old 11/22/2014  . Superficial thrombophlebitis of lower extremity 11/22/2014    Current Outpatient Prescriptions on File Prior to Visit  Medication Sig Dispense Refill  . acetaminophen (TYLENOL) 500 MG tablet Take 500-1,000 mg by mouth every 6 (six) hours as needed for mild pain.    Marland Kitchen. aspirin EC 81 MG tablet Take 1 tablet (81 mg total) by mouth daily.    Marland Kitchen. atorvastatin (LIPITOR) 20 MG tablet TAKE 1 TABLET(20 MG) BY MOUTH DAILY 90 tablet 1  . cholecalciferol (VITAMIN D) 1000 UNITS tablet Take 1,000 Units by mouth daily.     . furosemide (LASIX) 20 MG tablet TAKE 1 TABLET(20 MG) BY MOUTH TWICE DAILY  -- Office visit needed for further refills 180 tablet 0  . Garlic 1000 MG CAPS Take 1,000 mg by mouth daily.    . hydrALAZINE (APRESOLINE) 50 MG tablet TAKE 1 AND 1/2 TABLETS BY MOUTH THREE TIMES DAILY 405 tablet 1  . hydrochlorothiazide (HYDRODIURIL) 25 MG tablet TAKE 1 TABLET(25 MG) BY MOUTH DAILY 90 tablet 1  . metoprolol tartrate (LOPRESSOR) 25 MG tablet Take 1 tablet (25 mg total) by mouth daily.    Marland Kitchen. nystatin (NYSTATIN) powder APPLY TO ABDOMEN FOLDS AND UNDER BREAST 2-3 TIMES DAILY 60 g 5  . omega-3 acid ethyl esters (LOVAZA) 1 G capsule Take 1 g by mouth daily.     Marland Kitchen. omeprazole (PRILOSEC) 20 MG capsule Take 1 capsule (20 mg total) by mouth daily. (Patient taking differently: Take 20 mg by mouth daily as  needed (acid reflux). ) 30 capsule 3  . vitamin E 400 UNIT capsule Take 400 Units by mouth daily.     No current facility-administered medications on file prior to visit.     Past Medical History:  Diagnosis Date  . Anemia   . CHF (congestive heart failure) (HCC)   . Diabetes mellitus without complication (HCC)   . Gastric ulcer   . Glaucoma   . Hiatal hernia   . Hyperlipidemia   . Hypertension     Past Surgical History:  Procedure Laterality Date  . LEFT HEART CATH AND CORONARY ANGIOGRAPHY N/A 07/25/2016   Procedure: Left Heart  Cath and Coronary Angiography;  Surgeon: Kathleene Hazel, MD;  Location: [facility] INVASIVE CV LAB;  Service: Cardiovascular;  Laterality: N/A;    Social History   Social History  . Marital status: Divorced    Spouse name: N/A  . Number of children: N/A  . Years of education: N/A   Social History Main Topics  . Smoking status: Former Games developer  . Smokeless tobacco: Never Used  . Alcohol use No  . Drug use: No  . Sexual activity: Not Asked   Other Topics Concern  . None   Social History Narrative  . None    Family History  Problem Relation Age of Onset  . Heart disease Mother   . Alcohol abuse Father   . Alcohol abuse Sister   . Diabetes Sister   . Hypertension Sister   . Cancer Sister   . CAD Sister        One sister with stents  . Alcohol abuse Brother   . Diabetes Brother   . Hypertension Brother     Review of Systems  Constitutional: Negative for chills and fever.  Respiratory: Positive for shortness of breath (with activty). Negative for cough and wheezing.   Cardiovascular: Positive for palpitations. Negative for chest pain and leg swelling.  Gastrointestinal: Positive for abdominal pain. Negative for blood in stool, constipation, diarrhea and nausea.  Genitourinary: Positive for flank pain (right - 2 days). Negative for dysuria and hematuria.       Dark urine, abnormal odor  Musculoskeletal: Positive for gait problem.  Neurological: Negative for headaches.       Objective:   Vitals:   12/20/16 1525 12/20/16 1555  BP: (!) 142/90 136/82  Pulse: 75 73  Resp: 20   Temp: 98.1 F (36.7 C)   SpO2: 99%    Wt Readings from Last 3 Encounters:  12/20/16 200 lb (90.7 kg)  12/02/16 207 lb (93.9 kg)  08/22/16 210 lb (95.3 kg)   Body mass index is 32.28 kg/m.   Physical Exam    Constitutional: Appears well-developed and well-nourished. No distress.  HENT:  Head: Normocephalic and atraumatic.  Neck: Neck supple. No tracheal deviation present. No  thyromegaly present.  No cervical lymphadenopathy Cardiovascular: Normal rate, regular rhythm and normal heart sounds.   No murmur heard. No carotid bruit .  No edema Pulmonary/Chest: Effort normal and breath sounds normal. No respiratory distress. No has no wheezes. No rales.  Abdomen: soft, mild tenderness in suprapubic region and right flank w/o rebound or guarding, non distended Skin: Skin is warm and dry. Not diaphoretic.  Psychiatric: Normal mood and affect. Behavior is normal.      Assessment & Plan:    See Problem List for Assessment and Plan of chronic medical problems.

## 2016-12-20 NOTE — Assessment & Plan Note (Signed)
Lifestyle controlled Continue regular exercise Continue diabetic diet Check a1c

## 2016-12-20 NOTE — Assessment & Plan Note (Signed)
Associated with right flank pain Will check UA, UCx Will start empiric antibiotics

## 2016-12-20 NOTE — Assessment & Plan Note (Signed)
BP well controlled Current regimen effective and well tolerated Continue current medications at current doses cmp  

## 2016-12-20 NOTE — Progress Notes (Signed)
Pre visit review using our clinic review tool, if applicable. No additional management support is needed unless otherwise documented below in the visit note. 

## 2016-12-20 NOTE — Assessment & Plan Note (Signed)
Has poor balance and recent fall Will order a 4 prong cane and shower chair

## 2016-12-20 NOTE — Progress Notes (Signed)
Subjective:   Susan Brady is a 71 y.o. female who presents for an Initial Medicare Annual Wellness Visit.  Review of Systems    No ROS.  Medicare Wellness Visit. Additional risk factors are reflected in the social history.  Cardiac Risk Factors include: advanced age (>4255men, 75>65 women);diabetes mellitus;hypertension;dyslipidemia Sleep patterns: feels rested on waking, gets up 3 times nightly to void and sleeps 6-7 hours nightly.  Patient reports insomnia issues, discussed recommended sleep tips and stress reduction tips, education was attached to patient's AVS.  Home Safety/Smoke Alarms: Feels safe in home. Smoke alarms in place.  Living environment; residence and Firearm Safety: 1-story house/ trailer, no firearms, firearms stored safely. Lives with daughter, good support system, is requesting an order for shower chair and wide prong cane  Seat Belt Safety/Bike Helmet: Wears seat belt.     Objective:    Today's Vitals   12/20/16 1525 12/20/16 1555  BP: (!) 142/90 136/82  Pulse: 75 73  Resp: 20   Temp: 98.1 F (36.7 C)   SpO2: 99%   Weight: 200 lb (90.7 kg)   Height: 5\' 6"  (1.676 m)    Body mass index is 32.28 kg/m.   Current Medications (verified) Outpatient Encounter Prescriptions as of 12/20/2016  Medication Sig  . acetaminophen (TYLENOL) 500 MG tablet Take 500-1,000 mg by mouth every 6 (six) hours as needed for mild pain.  Marland Kitchen. aspirin EC 81 MG tablet Take 1 tablet (81 mg total) by mouth daily.  Marland Kitchen. atorvastatin (LIPITOR) 20 MG tablet TAKE 1 TABLET(20 MG) BY MOUTH DAILY  . cholecalciferol (VITAMIN D) 1000 UNITS tablet Take 1,000 Units by mouth daily.   . furosemide (LASIX) 20 MG tablet TAKE 1 TABLET(20 MG) BY MOUTH TWICE DAILY  -- Office visit needed for further refills  . Garlic 1000 MG CAPS Take 1,000 mg by mouth daily.  . hydrALAZINE (APRESOLINE) 50 MG tablet TAKE 1 AND 1/2 TABLETS BY MOUTH THREE TIMES DAILY  . hydrochlorothiazide (HYDRODIURIL) 25 MG tablet TAKE 1  TABLET(25 MG) BY MOUTH DAILY  . metoprolol tartrate (LOPRESSOR) 25 MG tablet Take 1 tablet (25 mg total) by mouth daily.  Marland Kitchen. nystatin (NYSTATIN) powder APPLY TO ABDOMEN FOLDS AND UNDER BREAST 2-3 TIMES DAILY  . omega-3 acid ethyl esters (LOVAZA) 1 G capsule Take 1 g by mouth daily.   Marland Kitchen. omeprazole (PRILOSEC) 20 MG capsule Take 1 capsule (20 mg total) by mouth daily. (Patient taking differently: Take 20 mg by mouth daily as needed (acid reflux). )  . vitamin E 400 UNIT capsule Take 400 Units by mouth daily.  . [DISCONTINUED] naproxen (NAPROSYN) 500 MG tablet Take 1 tablet (500 mg total) by mouth 2 (two) times daily with a meal. (Patient not taking: Reported on 12/20/2016)   No facility-administered encounter medications on file as of 12/20/2016.     Allergies (verified) Lisinopril   History: Past Medical History:  Diagnosis Date  . Anemia   . CHF (congestive heart failure) (HCC)   . Diabetes mellitus without complication (HCC)   . Gastric ulcer   . Glaucoma   . Hiatal hernia   . Hyperlipidemia   . Hypertension    Past Surgical History:  Procedure Laterality Date  . LEFT HEART CATH AND CORONARY ANGIOGRAPHY N/A 07/25/2016   Procedure: Left Heart Cath and Coronary Angiography;  Surgeon: Kathleene HazelMcAlhany, Christopher D, MD;  [facility];  Service: Cardiovascular;  Laterality: N/A;   Family History  Problem Relation Age of Onset  . Heart  disease Mother   . Alcohol abuse Father   . Alcohol abuse Sister   . Diabetes Sister   . Hypertension Sister   . Cancer Sister   . CAD Sister        One sister with stents  . Alcohol abuse Brother   . Diabetes Brother   . Hypertension Brother    Social History   Occupational History  . Not on file.   Social History Main Topics  . Smoking status: Former Games developer  . Smokeless tobacco: Never Used  . Alcohol use No  . Drug use: No  . Sexual activity: Not on file    Tobacco Counseling Counseling given: Not Answered   Activities of  Daily Living In your present state of health, do you have any difficulty performing the following activities: 12/20/2016 07/23/2016  Hearing? N N  Vision? N N  Difficulty concentrating or making decisions? N Y  Walking or climbing stairs? N Y  Dressing or bathing? N N  Doing errands, shopping? Y Y  Comment - daughter runs errands for pt  Preparing Food and eating ? Y -  Using the Toilet? N -  In the past six months, have you accidently leaked urine? N -  Do you have problems with loss of bowel control? N -  Managing your Medications? Y -  Managing your Finances? Y -  Housekeeping or managing your Housekeeping? Y -  Some recent data might be hidden    Immunizations and Health Maintenance Immunization History  Administered Date(s) Administered  . Influenza, High Dose Seasonal PF 11/14/2015, 12/20/2016  . Influenza,inj,Quad PF,6+ Mos 11/22/2014  . Pneumococcal Conjugate-13 03/24/2015  . Pneumococcal Polysaccharide-23 12/20/2016   Health Maintenance Due  Topic Date Due  . OPHTHALMOLOGY EXAM  10/15/1954  . TETANUS/TDAP  10/15/1963  . COLONOSCOPY  10/15/1994  . DEXA SCAN  10/14/2009  . MAMMOGRAM  09/17/2014  . PNA vac Low Risk Adult (2 of 2 - PPSV23) 03/23/2016    Patient Care Team: Pincus Sanes, MD as PCP - General (Internal Medicine)  Indicate any recent Medical Services you may have received from other than [facility] providers in the past year (date may be approximate).     Assessment:   This is a routine wellness examination for Susan Brady. Physical assessment deferred to PCP.   Hearing/Vision screen Hearing Screening Comments: Able to hear conversational tones w/o difficulty. No issues reported.  Passed whisper test  Vision Screening Comments: appointment yearly   Dietary issues and exercise activities discussed: Current Exercise Habits: Home exercise routine, Type of exercise: walking, Time (Minutes): 20, Frequency (Times/Week): 7, Weekly Exercise (Minutes/Week): 140,  Intensity: Mild, Exercise limited by: None identified  Diet (meal preparation, eat out, water intake, caffeinated beverages, dairy products, fruits and vegetables): in general, a "healthy" diet  , well balanced, eats a variety of fruits and vegetables daily, limits salt, fat/cholesterol, sugar, caffeine, drinks 6-8 glasses of water daily.  Goals    . Take time daily to relax and have time for myself      Depression Screen PHQ 2/9 Scores 12/20/2016 12/26/2015 11/22/2014  PHQ - 2 Score 1 0 0  PHQ- 9 Score 3 - -    Fall Risk Fall Risk  12/20/2016 12/26/2015 08/30/2015 11/22/2014  Falls in the past year? Yes No No No  Number falls in past yr: 1 - - -    Cognitive Function: MMSE - Mini Mental State Exam 12/20/2016 12/20/2016  Not completed: (No Data) Refused  Screening Tests Health Maintenance  Topic Date Due  . OPHTHALMOLOGY EXAM  10/15/1954  . TETANUS/TDAP  10/15/1963  . COLONOSCOPY  10/15/1994  . DEXA SCAN  10/14/2009  . MAMMOGRAM  09/17/2014  . PNA vac Low Risk Adult (2 of 2 - PPSV23) 03/23/2016  . FOOT EXAM  12/25/2016  . URINE MICROALBUMIN  12/25/2016  . HEMOGLOBIN A1C  12/26/2016  . INFLUENZA VACCINE  Completed  . Hepatitis C Screening  Completed      Plan:    Continue doing brain stimulating activities (puzzles, reading, adult coloring books, staying active) to keep memory sharp.   Continue to eat heart healthy diet (full of fruits, vegetables, whole grains, lean protein, water--limit salt, fat, and sugar intake) and increase physical activity as tolerated.  I have personally reviewed and noted the following in the patient's chart:   . Medical and social history . Use of alcohol, tobacco or illicit drugs  . Current medications and supplements . Functional ability and status . Nutritional status . Physical activity . Advanced directives . List of other physicians . Vitals . Screenings to include cognitive, depression, and falls . Referrals and  appointments  In addition, I have reviewed and discussed with patient certain preventive protocols, quality metrics, and best practice recommendations. A written personalized care plan for preventive services as well as general preventive health recommendations were provided to patient.     Wanda Plump, RN   12/20/2016    Medical screening examination/treatment/procedure(s) were performed by non-physician practitioner and as supervising physician I was immediately available for consultation/collaboration. I agree with above. Pincus Sanes, MD

## 2016-12-21 LAB — URINE CULTURE
MICRO NUMBER: 81232944
RESULT: NO GROWTH
SPECIMEN QUALITY:: ADEQUATE

## 2016-12-24 ENCOUNTER — Other Ambulatory Visit: Payer: Self-pay | Admitting: Emergency Medicine

## 2016-12-24 MED ORDER — POTASSIUM CHLORIDE CRYS ER 20 MEQ PO TBCR: 20.0000 meq | EXTENDED_RELEASE_TABLET | Freq: Every day | ORAL | 1 refills | Status: DC

## 2016-12-30 ENCOUNTER — Other Ambulatory Visit: Payer: Self-pay | Admitting: Internal Medicine

## 2017-01-20 ENCOUNTER — Other Ambulatory Visit: Payer: Self-pay | Admitting: Internal Medicine

## 2017-02-05 ENCOUNTER — Ambulatory Visit
Admission: RE | Admit: 2017-02-05 | Discharge: 2017-02-05 | Disposition: A | Discharge status: Home / Self Care | Payer: Medicare Other | Source: Ambulatory Visit | Attending: Internal Medicine | Admitting: Internal Medicine

## 2017-02-05 ENCOUNTER — Inpatient Hospital Stay: Admission: RE | Admit: 2017-02-05 | Payer: Medicare Other | Source: Ambulatory Visit

## 2017-02-05 DIAGNOSIS — Z1231 Encounter for screening mammogram for malignant neoplasm of breast: Secondary | ICD-10-CM | POA: Diagnosis not present

## 2017-02-12 ENCOUNTER — Other Ambulatory Visit: Payer: Self-pay | Admitting: Internal Medicine

## 2017-02-25 ENCOUNTER — Ambulatory Visit (INDEPENDENT_AMBULATORY_CARE_PROVIDER_SITE_OTHER): Payer: Medicare Other | Admitting: Family Medicine

## 2017-02-25 ENCOUNTER — Encounter: Payer: Self-pay | Admitting: Family Medicine

## 2017-02-25 DIAGNOSIS — R6889 Other general symptoms and signs: Secondary | ICD-10-CM

## 2017-02-25 NOTE — Patient Instructions (Signed)
Please try things such as zyrtec-D or allegra-D which is an antihistamine and decongestant.   Please try afrin which will help with nasal congestion but use for only three days.   Please also try using a netti pot on a regular occasion.  Honey can help with a sore throat.   Delsym can help with a cough.   Please follow up with me on Thursday if your symptoms worsen or if new symptoms develop

## 2017-02-25 NOTE — Progress Notes (Signed)
Susan Brady - 72 y.o. female MRN [id number]  Date of birth: [birth year]  SUBJECTIVE:  Including CC & ROS.  Chief Complaint  Patient presents with  . Cough    Susan Brady is a 72 y.o. female that is presenting with a cough, diarrhea and  body aches. Ongoing for two days.  Patient has taken tylenol with no improvement. Admits to night sweats and aches. Denies fevers.  Her granddaughter was seen in the emergency room but her symptoms were different. Denies any rash. Received the flu vaccine. Symptoms seem to be severe. Symptoms seem to be worsening.    Review of Systems  Constitutional: Positive for chills and fever.  Respiratory: Negative for cough.   Cardiovascular: Negative for chest pain.  Genitourinary: Negative for dysuria.  Musculoskeletal: Negative for gait problem.  Skin: Negative for rash.  Hematological: Negative for adenopathy.    HISTORY: Past Medical, Surgical, Social, and Family History Reviewed & Updated per EMR.   Pertinent Historical Findings include:  Past Medical History:  Diagnosis Date  . Anemia   . CHF (congestive heart failure) (HCC)   . Diabetes mellitus without complication (HCC)   . Gastric ulcer   . Glaucoma   . Hiatal hernia   . Hyperlipidemia   . Hypertension     Past Surgical History:  Procedure Laterality Date  . LEFT HEART CATH AND CORONARY ANGIOGRAPHY N/A 07/25/2016   Procedure: Left Heart Cath and Coronary Angiography;  Surgeon: Kathleene Hazel, MD;  Location: [facility] INVASIVE CV LAB;  Service: Cardiovascular;  Laterality: N/A;    Allergies  Allergen Reactions  . Lisinopril Anaphylaxis    cough    Family History  Problem Relation Age of Onset  . Heart disease Mother   . Alcohol abuse Father   . Alcohol abuse Sister   . Diabetes Sister   . Hypertension Sister   . Cancer Sister   . CAD Sister        One sister with stents  . Alcohol abuse Brother   . Diabetes Brother   . Hypertension Brother      Social History    Socioeconomic History  . Marital status: Divorced    Spouse name: Not on file  . Number of children: Not on file  . Years of education: Not on file  . Highest education level: Not on file  Social Needs  . Financial resource strain: Not on file  . Food insecurity - worry: Not on file  . Food insecurity - inability: Not on file  . Transportation needs - medical: Not on file  . Transportation needs - non-medical: Not on file  Occupational History  . Not on file  Tobacco Use  . Smoking status: Former Games developer  . Smokeless tobacco: Never Used  Substance and Sexual Activity  . Alcohol use: No    Alcohol/week: 0.0 oz  . Drug use: No  . Sexual activity: Not on file  Other Topics Concern  . Not on file  Social History Narrative  . Not on file     PHYSICAL EXAM:  VS: BP 140/86 (BP Location: Left Arm, Patient Position: Sitting, Cuff Size: Normal)   Pulse 64   Temp (!) 100.8 F (38.2 C) (Oral)   Ht 5\' 6"  (1.676 m)   Wt 200 lb (90.7 kg)   SpO2 98%   BMI 32.28 kg/m  Physical Exam Gen: NAD, alert, cooperative with exam,  ENT: normal lips, normal nasal mucosa, normal-appearing tympanic membranes bilaterally Eye: normal EOM, normal  conjunctiva and lids CV:  no edema, +2 pedal pulses, S1-S2, regular rate and rhythm   Resp: no accessory muscle use, non-labored, clear to auscultation bilaterally, no crackles or wheezes Skin: no rashes, no areas of induration  Neuro: normal tone, normal sensation to touch Psych:  normal insight, alert and oriented MSK: normal gait, normal strength       ASSESSMENT & PLAN:   Flu-like symptoms Symptoms are possible for flu in nature. She did have the flu vaccine. No suggestion of pneumonia. No dysuria. Discussed Tamiflu and will hold off on for now. - Counseled on supportive care - Advised to follow-up on Thursday symptoms worsen or new symptoms arise

## 2017-02-25 NOTE — Assessment & Plan Note (Signed)
Symptoms are possible for flu in nature. She did have the flu vaccine. No suggestion of pneumonia. No dysuria. Discussed Tamiflu and will hold off on for now. - Counseled on supportive care - Advised to follow-up on Thursday symptoms worsen or new symptoms arise

## 2017-02-28 ENCOUNTER — Telehealth: Payer: Self-pay | Admitting: Internal Medicine

## 2017-02-28 ENCOUNTER — Ambulatory Visit: Payer: Self-pay | Admitting: *Deleted

## 2017-02-28 MED ORDER — AZITHROMYCIN 250 MG PO TABS: ORAL_TABLET | ORAL | 0 refills | Status: DC

## 2017-02-28 MED ORDER — OSELTAMIVIR PHOSPHATE 75 MG PO CAPS: 75.0000 mg | ORAL_CAPSULE | Freq: Two times a day (BID) | ORAL | 0 refills | Status: DC

## 2017-02-28 MED ORDER — HYDROCODONE-HOMATROPINE 5-1.5 MG/5ML PO SYRP: 5.0000 mL | ORAL_SOLUTION | Freq: Three times a day (TID) | ORAL | 0 refills | Status: DC | PRN

## 2017-02-28 NOTE — Telephone Encounter (Signed)
Copied from CRM [id number]. Topic: General - Other >> Feb 28, 2017  9:24 AM Stephannie LiSimmons, Janett L, NT wrote: Reason for CRM: Patient has the same symptoms ,body aches ,coughing fever ,headache please advise [phone]  Patient saw Dr.Schmitz. I will route to his CMA.

## 2017-02-28 NOTE — Telephone Encounter (Signed)
This message is for Dr. Lawerance BachBurns, who is the patient's PCP.   Susan Brady saw Dr. Jordan LikesSchmitz on 1/8 for flu-like symptoms including fever and cough.  Mrs. Janey GreaserFrazier's daughter called today to report her mother is about the same except the cough has worsened and is interfering with sleep at night.  Reports that delsym has not helped at all and is requesting something for the cough.   Please advise.

## 2017-02-28 NOTE — Telephone Encounter (Signed)
Given no improvement - will prescribe tamiflu and an antibiotic if they are ok with that.  We can try a cough syrup with codeine in it, but they have to be aware if may cause her drowsy and increase her risk of falls - this would only be for night time.  Let me know.

## 2017-02-28 NOTE — Telephone Encounter (Signed)
See other note

## 2017-02-28 NOTE — Telephone Encounter (Signed)
Spoke with pts daughter, she would like RXs sent to [facility]

## 2017-03-10 ENCOUNTER — Encounter: Payer: Self-pay | Admitting: Internal Medicine

## 2017-03-27 ENCOUNTER — Other Ambulatory Visit: Payer: Self-pay | Admitting: Internal Medicine

## 2017-03-28 ENCOUNTER — Ambulatory Visit: Payer: Medicare Other | Admitting: Family

## 2017-03-29 NOTE — Progress Notes (Signed)
Subjective:    Patient ID: Susan Brady, female    DOB: 06/12/1945, 72 y.o.   MRN: [id number]  HPI She is here for an acute visit for cold symptoms.   Her symptoms started about one week - week and a half ago.    She is experiencing productive cough, shortness of breath at times, wheezing, fatigue, decreased appetite, nasal congestion, left-sided ear pain, intermittent sore throat and headaches.  She has not noticed any fevers, chills or sinus pain.  She denies chest pain, diarrhea or nausea.  She has tried taking cough syrup.  The cough syrup has helped her sleep at night.  She has not taken any other medication.    Medications and allergies reviewed with patient and updated if appropriate.  Patient Active Problem List   Diagnosis Date Noted  . Flu-like symptoms 02/25/2017  . Poor balance 12/20/2016  . Abnormal urine color 12/20/2016  . Right flank pain 12/20/2016  . Acute left-sided low back pain without sciatica 12/03/2016  . Normal coronary arteries 08/07/2016  . Cardiomegaly 08/07/2016  . Angina at rest [facility]) 07/25/2016  . Abnormal stress test   . Chest pain of unknown etiology 07/23/2016  . Bradycardia 07/23/2016  . Dupuytren's contracture of right hand 07/19/2016  . Hyperlipidemia 06/25/2016  . Neck pain on right side 11/14/2015  . Poor sleep pattern 11/14/2015  . Constipation 11/14/2015  . Hiatal hernia, large 08/30/2015  . Gastric ulcer 08/30/2015  . Anorexia 08/30/2015  . Unsteadiness 08/30/2015  . Gastritis 08/15/2015  . Left shoulder pain 06/22/2015  . Essential hypertension 11/22/2014  . Non-insulin dependent type 2 diabetes mellitus (HCC) 11/22/2014  . Arthritis 11/22/2014  . Glaucoma 11/22/2014  . GERD (gastroesophageal reflux disease) 11/22/2014  . Varicose veins 11/22/2014  . Myocardial infarct, old 11/22/2014  . Superficial thrombophlebitis of lower extremity 11/22/2014    Current Outpatient Medications on File Prior to Visit  Medication Sig  Dispense Refill  . acetaminophen (TYLENOL) 500 MG tablet Take 500-1,000 mg by mouth every 6 (six) hours as needed for mild pain.    Marland Kitchen aspirin EC 81 MG tablet Take 1 tablet (81 mg total) by mouth daily.    Marland Kitchen atorvastatin (LIPITOR) 20 MG tablet TAKE 1 TABLET(20 MG) BY MOUTH DAILY 90 tablet 1  . cholecalciferol (VITAMIN D) 1000 UNITS tablet Take 1,000 Units by mouth daily.     . furosemide (LASIX) 20 MG tablet TAKE 1 TABLET BY MOUTH TWICE DAILY- 180 tablet 1  . Garlic 1000 MG CAPS Take 1,000 mg by mouth daily.    . hydrALAZINE (APRESOLINE) 50 MG tablet Take 1.5 tablets (75 mg total) by mouth 3 (three) times daily. --- Office visit needed for further refills 405 tablet 0  . hydrochlorothiazide (HYDRODIURIL) 25 MG tablet TAKE 1 TABLET(25 MG) BY MOUTH DAILY 90 tablet 1  . metoprolol tartrate (LOPRESSOR) 25 MG tablet Take 1 tablet (25 mg total) by mouth daily.    Marland Kitchen nystatin (NYSTATIN) powder APPLY TO ABDOMEN FOLDS AND UNDER BREAST 2-3 TIMES DAILY 60 g 5  . omega-3 acid ethyl esters (LOVAZA) 1 G capsule Take 1 g by mouth daily.     Marland Kitchen omeprazole (PRILOSEC) 20 MG capsule Take 1 capsule (20 mg total) by mouth daily. (Patient taking differently: Take 20 mg by mouth daily as needed (acid reflux). ) 30 capsule 3  . potassium chloride SA (K-DUR,KLOR-CON) 20 MEQ tablet Take 1 tablet (20 mEq total) daily by mouth. 90 tablet 1  . vitamin E  400 UNIT capsule Take 400 Units by mouth daily.     No current facility-administered medications on file prior to visit.     Past Medical History:  Diagnosis Date  . Anemia   . CHF (congestive heart failure) (HCC)   . Diabetes mellitus without complication (HCC)   . Gastric ulcer   . Glaucoma   . Hiatal hernia   . Hyperlipidemia   . Hypertension     Past Surgical History:  Procedure Laterality Date  . LEFT HEART CATH AND CORONARY ANGIOGRAPHY N/A 07/25/2016   Procedure: Left Heart Cath and Coronary Angiography;  Surgeon: Kathleene HazelMcAlhany, Christopher D, MD;  [facility];  Service: Cardiovascular;  Laterality: N/A;    Social History   Socioeconomic History  . Marital status: Divorced    Spouse name: None  . Number of children: None  . Years of education: None  . Highest education level: None  Social Needs  . Financial resource strain: None  . Food insecurity - worry: None  . Food insecurity - inability: None  . Transportation needs - medical: None  . Transportation needs - non-medical: None  Occupational History  . None  Tobacco Use  . Smoking status: Former Games developermoker  . Smokeless tobacco: Never Used  Substance and Sexual Activity  . Alcohol use: No    Alcohol/week: 0.0 oz  . Drug use: No  . Sexual activity: None  Other Topics Concern  . None  Social History Narrative  . None    Family History  Problem Relation Age of Onset  . Heart disease Mother   . Alcohol abuse Father   . Alcohol abuse Sister   . Diabetes Sister   . Hypertension Sister   . Cancer Sister   . CAD Sister        One sister with stents  . Alcohol abuse Brother   . Diabetes Brother   . Hypertension Brother     Review of Systems  Constitutional: Positive for appetite change and fatigue. Negative for chills and fever.  HENT: Positive for congestion, ear pain (left ) and sore throat (intermittent). Negative for sinus pressure and sinus pain.   Respiratory: Positive for cough (productive), shortness of breath (sometimes) and wheezing.   Cardiovascular: Negative for chest pain.  Gastrointestinal: Negative for diarrhea and nausea.  Musculoskeletal: Positive for myalgias (sometimes).  Neurological: Positive for headaches. Negative for light-headedness.       Objective:   Vitals:   03/31/17 0906  BP: 120/72  Pulse: 75  Resp: 16  Temp: 98.2 F (36.8 C)  SpO2: 97%   Wt Readings from Last 3 Encounters:  03/31/17 208 lb (94.3 kg)  02/25/17 200 lb (90.7 kg)  12/20/16 200 lb (90.7 kg)   Body mass index is 33.57 kg/m.   Physical Exam      GENERAL APPEARANCE: Appears stated age, well appearing, NAD EYES: conjunctiva clear, no icterus HEENT: bilateral tympanic membranes and ear canals normal, oropharynx with mild erythema, no thyromegaly, trachea midline, no cervical or supraclavicular lymphadenopathy LUNGS: Clear to auscultation without wheeze or crackles, unlabored breathing, good air entry bilaterally CARDIOVASCULAR: Normal S1,S2 without murmurs, no edema SKIN: Warm, dry      Assessment & Plan:    See Problem List for Assessment and Plan of chronic medical problems.

## 2017-03-31 ENCOUNTER — Encounter: Payer: Self-pay | Admitting: Internal Medicine

## 2017-03-31 ENCOUNTER — Ambulatory Visit (INDEPENDENT_AMBULATORY_CARE_PROVIDER_SITE_OTHER): Payer: Medicare Other | Admitting: Internal Medicine

## 2017-03-31 VITALS — BP 120/72 | HR 75 | Temp 98.2°F | Resp 16 | Wt 208.0 lb

## 2017-03-31 DIAGNOSIS — J209 Acute bronchitis, unspecified: Secondary | ICD-10-CM | POA: Insufficient documentation

## 2017-03-31 MED ORDER — HYDROCODONE-HOMATROPINE 5-1.5 MG/5ML PO SYRP: 5.0000 mL | ORAL_SOLUTION | Freq: Three times a day (TID) | ORAL | 0 refills | Status: DC | PRN

## 2017-03-31 MED ORDER — AZITHROMYCIN 250 MG PO TABS: ORAL_TABLET | ORAL | 0 refills | Status: DC

## 2017-03-31 NOTE — Assessment & Plan Note (Signed)
Present for 7-10 days, productive cough and concern for possible bacterial infection Z-Pak, Hycodan cough syrup Continue increased rest and fluids Can take over-the-counter cold medications as needed Call if no improvement

## 2017-03-31 NOTE — Patient Instructions (Signed)
Take the antibiotic and cough syrup as prescribed.  Continue increased rest and fluids.   Call if no improvement     Upper Respiratory Infection, Adult Most upper respiratory infections (URIs) are a viral infection of the air passages leading to the lungs. A URI affects the nose, throat, and upper air passages. The most common type of URI is nasopharyngitis and is typically referred to as "the common cold." URIs run their course and usually go away on their own. Most of the time, a URI does not require medical attention, but sometimes a bacterial infection in the upper airways can follow a viral infection. This is called a secondary infection. Sinus and middle ear infections are common types of secondary upper respiratory infections. Bacterial pneumonia can also complicate a URI. A URI can worsen asthma and chronic obstructive pulmonary disease (COPD). Sometimes, these complications can require emergency medical care and may be life threatening. What are the causes? Almost all URIs are caused by viruses. A virus is a type of germ and can spread from one person to another. What increases the risk? You may be at risk for a URI if:  You smoke.  You have chronic heart or lung disease.  You have a weakened defense (immune) system.  You are very young or very old.  You have nasal allergies or asthma.  You work in crowded or poorly ventilated areas.  You work in health care facilities or schools.  What are the signs or symptoms? Symptoms typically develop 2-3 days after you come in contact with a cold virus. Most viral URIs last 7-10 days. However, viral URIs from the influenza virus (flu virus) can last 14-18 days and are typically more severe. Symptoms may include:  Runny or stuffy (congested) nose.  Sneezing.  Cough.  Sore throat.  Headache.  Fatigue.  Fever.  Loss of appetite.  Pain in your forehead, behind your eyes, and over your cheekbones (sinus pain).  Muscle  aches.  How is this diagnosed? Your health care provider may diagnose a URI by:  Physical exam.  Tests to check that your symptoms are not due to another condition such as: ? Strep throat. ? Sinusitis. ? Pneumonia. ? Asthma.  How is this treated? A URI goes away on its own with time. It cannot be cured with medicines, but medicines may be prescribed or recommended to relieve symptoms. Medicines may help:  Reduce your fever.  Reduce your cough.  Relieve nasal congestion.  Follow these instructions at home:  Take medicines only as directed by your health care provider.  Gargle warm saltwater or take cough drops to comfort your throat as directed by your health care provider.  Use a warm mist humidifier or inhale steam from a shower to increase air moisture. This may make it easier to breathe.  Drink enough fluid to keep your urine clear or pale yellow.  Eat soups and other clear broths and maintain good nutrition.  Rest as needed.  Return to work when your temperature has returned to normal or as your health care provider advises. You may need to stay home longer to avoid infecting others. You can also use a face mask and careful hand washing to prevent spread of the virus.  Increase the usage of your inhaler if you have asthma.  Do not use any tobacco products, including cigarettes, chewing tobacco, or electronic cigarettes. If you need help quitting, ask your health care provider. How is this prevented? The best way to protect  yourself from getting a cold is to practice good hygiene.  Avoid oral or hand contact with people with cold symptoms.  Wash your hands often if contact occurs.  There is no clear evidence that vitamin C, vitamin E, echinacea, or exercise reduces the chance of developing a cold. However, it is always recommended to get plenty of rest, exercise, and practice good nutrition. Contact a health care provider if:  You are getting worse rather than  better.  Your symptoms are not controlled by medicine.  You have chills.  You have worsening shortness of breath.  You have brown or red mucus.  You have yellow or brown nasal discharge.  You have pain in your face, especially when you bend forward.  You have a fever.  You have swollen neck glands.  You have pain while swallowing.  You have white areas in the back of your throat. Get help right away if:  You have severe or persistent: ? Headache. ? Ear pain. ? Sinus pain. ? Chest pain.  You have chronic lung disease and any of the following: ? Wheezing. ? Prolonged cough. ? Coughing up blood. ? A change in your usual mucus.  You have a stiff neck.  You have changes in your: ? Vision. ? Hearing. ? Thinking. ? Mood. This information is not intended to replace advice given to you by your health care provider. Make sure you discuss any questions you have with your health care provider. Document Released: 07/31/2000 Document Revised: 10/08/2015 Document Reviewed: 05/12/2013 Elsevier Interactive Patient Education  [employer]2018 Elsevier Inc.

## 2017-04-10 ENCOUNTER — Other Ambulatory Visit: Payer: Self-pay | Admitting: Internal Medicine

## 2017-04-11 NOTE — Telephone Encounter (Signed)
Refuse - no longer taking 

## 2017-04-11 NOTE — Telephone Encounter (Signed)
Not on current med list. Last A1c was prediabetic range.

## 2017-04-15 ENCOUNTER — Ambulatory Visit: Payer: Self-pay | Admitting: *Deleted

## 2017-04-15 NOTE — Telephone Encounter (Signed)
Pt reports abdominal pain, middle of abdomen, above umbilicus. Also with pain left side "below ribs on side."  Pain is intermittent. Onset 2 weeks ago; 7/10 when occurs. Denies any nausea, vomiting, fever, SOB, CP. Left sided pain "worse with walking; sometimes goes to back." Denies any pain, burning with urination. States bowels moving, LBM this AM ,normal pattern QD or QOD. States adequate amount but "hard; belly bloated and gassy." Appt made with Pollie FriarL. Murray NP for tomorrow AM. Instructed to call back if symptoms worsen. Care advise given per protocol.  Reason for Disposition . [1] MODERATE pain (e.g., interferes with normal activities) AND [2] pain comes and goes (cramps) AND [3] present > 24 hours  (Exception: pain with Vomiting or Diarrhea - see that Guideline)  Answer Assessment - Initial Assessment Questions 1. LOCATION: "Where does it hurt?"      Middle of stomach, above umbilicus. Left side below rib cage, radiates to back sometimes 2. RADIATION: "Does the pain shoot anywhere else?" (e.g., chest, back)     no 3. ONSET: "When did the pain begin?" (e.g., minutes, hours or days ago)       2 weeks 4. SUDDEN: "Gradual or sudden onset?"     Gradual 5. PATTERN "Does the pain come and go, or is it constant?"    - If constant: "Is it getting better, staying the same, or worsening?"      (Note: Constant means the pain never goes away completely; most serious pain is constant and it progresses)     - If intermittent: "How long does it last?" "Do you have pain now?"     (Note: Intermittent means the pain goes away completely between bouts)     Comes and goes, mostly with walking 6. SEVERITY: "How bad is the pain?"  (e.g., Scale 1-10; mild, moderate, or severe)   - MILD (1-3): doesn't interfere with normal activities, abdomen soft and not tender to touch    - MODERATE (4-7): interferes with normal activities or awakens from sleep, tender to touch    - SEVERE (8-10): excruciating pain, doubled over,  unable to do any normal activities      7/10 7. RECURRENT SYMPTOM: "Have you ever had this type of abdominal pain before?" If so, ask: "When was the last time?" and "What happened that time?"      No  8. CAUSE: "What do you think is causing the abdominal pain?"     "Ulcer" 9. RELIEVING/AGGRAVATING FACTORS: "What makes it better or worse?" (e.g., movement, antacids, bowel movement)     Worse with walking 10. OTHER SYMPTOMS: "Has there been any vomiting, diarrhea, constipation, or urine problems?"       LBM this am, have been hard, good amounts 11. PREGNANCY: "Is there any chance you are pregnant?" "When was your last menstrual period?"       no  Protocols used: [facility]

## 2017-04-16 ENCOUNTER — Encounter: Payer: Self-pay | Admitting: Family

## 2017-04-16 ENCOUNTER — Other Ambulatory Visit: Payer: Self-pay | Admitting: Family

## 2017-04-16 ENCOUNTER — Other Ambulatory Visit (INDEPENDENT_AMBULATORY_CARE_PROVIDER_SITE_OTHER): Payer: Medicare Other

## 2017-04-16 ENCOUNTER — Ambulatory Visit (INDEPENDENT_AMBULATORY_CARE_PROVIDER_SITE_OTHER): Payer: Medicare Other | Admitting: Family

## 2017-04-16 VITALS — BP 122/70 | HR 58 | Temp 98.4°F | Ht 66.0 in | Wt 210.0 lb

## 2017-04-16 DIAGNOSIS — R1012 Left upper quadrant pain: Secondary | ICD-10-CM

## 2017-04-16 DIAGNOSIS — R1084 Generalized abdominal pain: Secondary | ICD-10-CM

## 2017-04-16 LAB — LIPASE: Lipase: 36 U/L (ref 11.0–59.0)

## 2017-04-16 LAB — CBC WITH DIFFERENTIAL/PLATELET
BASOS PCT: 0.7 % (ref 0.0–3.0)
Basophils Absolute: 0 10*3/uL (ref 0.0–0.1)
EOS ABS: 0.1 10*3/uL (ref 0.0–0.7)
EOS PCT: 2.3 % (ref 0.0–5.0)
HCT: 35.9 % — ABNORMAL LOW (ref 36.0–46.0)
Hemoglobin: 11.8 g/dL — ABNORMAL LOW (ref 12.0–15.0)
Lymphocytes Relative: 36.7 % (ref 12.0–46.0)
Lymphs Abs: 2.1 10*3/uL (ref 0.7–4.0)
MCHC: 32.9 g/dL (ref 30.0–36.0)
MCV: 87.9 fl (ref 78.0–100.0)
Monocytes Absolute: 0.7 10*3/uL (ref 0.1–1.0)
Monocytes Relative: 12.1 % — ABNORMAL HIGH (ref 3.0–12.0)
NEUTROS ABS: 2.7 10*3/uL (ref 1.4–7.7)
Neutrophils Relative %: 48.2 % (ref 43.0–77.0)
PLATELETS: 342 10*3/uL (ref 150.0–400.0)
RBC: 4.08 Mil/uL (ref 3.87–5.11)
RDW: 13.7 % (ref 11.5–15.5)
WBC: 5.7 10*3/uL (ref 4.0–10.5)

## 2017-04-16 LAB — COMPREHENSIVE METABOLIC PANEL
ALT: 14 U/L (ref 0–35)
AST: 18 U/L (ref 0–37)
Albumin: 4.1 g/dL (ref 3.5–5.2)
Alkaline Phosphatase: 48 U/L (ref 39–117)
BUN: 15 mg/dL (ref 6–23)
CHLORIDE: 103 meq/L (ref 96–112)
CO2: 31 meq/L (ref 19–32)
CREATININE: 0.66 mg/dL (ref 0.40–1.20)
Calcium: 9.7 mg/dL (ref 8.4–10.5)
GFR: 113.06 mL/min (ref >60.00)
GLUCOSE: 97 mg/dL (ref 70–99)
Potassium: 3.9 mEq/L (ref 3.5–5.1)
SODIUM: 141 meq/L (ref 135–145)
Total Bilirubin: 0.4 mg/dL (ref 0.2–1.2)
Total Protein: 7.3 g/dL (ref 6.0–8.3)

## 2017-04-16 LAB — AMYLASE: AMYLASE: 50 U/L (ref 27–131)

## 2017-04-16 LAB — URINALYSIS, ROUTINE W REFLEX MICROSCOPIC
Bilirubin Urine: NEGATIVE
Hgb urine dipstick: NEGATIVE
KETONES UR: NEGATIVE
Nitrite: NEGATIVE
PH: 7.5 (ref 5.0–8.0)
SPECIFIC GRAVITY, URINE: 1.01 (ref 1.000–1.030)
Total Protein, Urine: NEGATIVE
URINE GLUCOSE: NEGATIVE
UROBILINOGEN UA: 0.2 (ref 0.0–1.0)

## 2017-04-16 MED ORDER — PANTOPRAZOLE SODIUM 40 MG PO TBEC: 40.0000 mg | DELAYED_RELEASE_TABLET | Freq: Every day | ORAL | 1 refills | Status: DC

## 2017-04-16 NOTE — Progress Notes (Signed)
Susan Brady is a 72 y.o. female with the following history as recorded in EpicCare:  Patient Active Problem List   Diagnosis Date Noted  . Acute bronchitis 03/31/2017  . Flu-like symptoms 02/25/2017  . Poor balance 12/20/2016  . Abnormal urine color 12/20/2016  . Right flank pain 12/20/2016  . Acute left-sided low back pain without sciatica 12/03/2016  . Normal coronary arteries 08/07/2016  . Cardiomegaly 08/07/2016  . Angina at rest [facility]) 07/25/2016  . Abnormal stress test   . Chest pain of unknown etiology 07/23/2016  . Bradycardia 07/23/2016  . Dupuytren's contracture of right hand 07/19/2016  . Hyperlipidemia 06/25/2016  . Neck pain on right side 11/14/2015  . Poor sleep pattern 11/14/2015  . Constipation 11/14/2015  . Hiatal hernia, large 08/30/2015  . Gastric ulcer 08/30/2015  . Anorexia 08/30/2015  . Unsteadiness 08/30/2015  . Gastritis 08/15/2015  . Left shoulder pain 06/22/2015  . Essential hypertension 11/22/2014  . Non-insulin dependent type 2 diabetes mellitus ([place]) 11/22/2014  . Arthritis 11/22/2014  . Glaucoma 11/22/2014  . GERD (gastroesophageal reflux disease) 11/22/2014  . Varicose veins 11/22/2014  . Myocardial infarct, old 11/22/2014  . Superficial thrombophlebitis of lower extremity 11/22/2014    Current Outpatient Medications  Medication Sig Dispense Refill  . acetaminophen (TYLENOL) 500 MG tablet Take 500-1,000 mg by mouth every 6 (six) hours as needed for mild pain.    Marland Kitchen aspirin EC 81 MG tablet Take 1 tablet (81 mg total) by mouth daily.    Marland Kitchen atorvastatin (LIPITOR) 20 MG tablet TAKE 1 TABLET(20 MG) BY MOUTH DAILY 90 tablet 1  . cholecalciferol (VITAMIN D) 1000 UNITS tablet Take 1,000 Units by mouth daily.     . furosemide (LASIX) 20 MG tablet TAKE 1 TABLET BY MOUTH TWICE DAILY- 180 tablet 1  . Garlic 9509 MG CAPS Take 1,000 mg by mouth daily.    . hydrALAZINE (APRESOLINE) 50 MG tablet Take 1.5 tablets (75 mg total) by mouth 3 (three) times daily.  --- Office visit needed for further refills 405 tablet 0  . hydrochlorothiazide (HYDRODIURIL) 25 MG tablet TAKE 1 TABLET(25 MG) BY MOUTH DAILY 90 tablet 1  . metoprolol tartrate (LOPRESSOR) 25 MG tablet Take 1 tablet (25 mg total) by mouth daily.    Marland Kitchen nystatin (NYSTATIN) powder APPLY TO ABDOMEN FOLDS AND UNDER BREAST 2-3 TIMES DAILY 60 g 5  . omega-3 acid ethyl esters (LOVAZA) 1 G capsule Take 1 g by mouth daily.     . potassium chloride SA (K-DUR,KLOR-CON) 20 MEQ tablet Take 1 tablet (20 mEq total) daily by mouth. 90 tablet 1  . vitamin E 400 UNIT capsule Take 400 Units by mouth daily.    . pantoprazole (PROTONIX) 40 MG tablet Take 1 tablet (40 mg total) by mouth daily. 30 tablet 1   No current facility-administered medications for this visit.     Allergies: Lisinopril  Past Medical History:  Diagnosis Date  . Anemia   . CHF (congestive heart failure) ([place])   . Diabetes mellitus without complication ([place])   . Gastric ulcer   . Glaucoma   . Hiatal hernia   . Hyperlipidemia   . Hypertension     Past Surgical History:  Procedure Laterality Date  . LEFT HEART CATH AND CORONARY ANGIOGRAPHY N/A 07/25/2016   Procedure: Left Heart Cath and Coronary Angiography;  Surgeon: Burnell Blanks, MD;  Location: [facility];  Service: Cardiovascular;  Laterality: N/A;    Family History  Problem Relation Age  of Onset  . Heart disease Mother   . Alcohol abuse Father   . Alcohol abuse Sister   . Diabetes Sister   . Hypertension Sister   . Cancer Sister   . CAD Sister        One sister with stents  . Alcohol abuse Brother   . Diabetes Brother   . Hypertension Brother     Social History   Tobacco Use  . Smoking status: Former Research scientist (life sciences)  . Smokeless tobacco: Never Used  Substance Use Topics  . Alcohol use: No    Alcohol/week: 0.0 oz    Subjective:  Abdominal pain x 2 weeks; feels like pain is localized in upper abdomen; feels like food aggravates the symptoms; denies any  nausea, vomiting; has chronic problems with constipation- no changes in bowel movements;  Does have know GERD- takes her Prilosec daily; no known history of gallbladder disease; denies any history of kidney stone; denies any changes in her bladder habits;   Objective:  Vitals:   04/16/17 0856  BP: 122/70  Pulse: (!) 58  Temp: 98.4 F (36.9 C)  TempSrc: Oral  SpO2: 99%  Weight: 210 lb (95.3 kg)  Height: _0  (1.676 m)    General: Well developed, well nourished, in no acute distress  Skin : Warm and dry.  Head: Normocephalic and atraumatic  Lungs: Respirations unlabored; clear to auscultation bilaterally without wheeze, rales, rhonchi  Abdomen: Soft; nontender; nondistended; normoactive bowel sounds; no masses or hepatosplenomegaly  Musculoskeletal: No deformities; no active joint inflammation  Neurologic: Alert and oriented; speech intact; face symmetrical; moves all extremities well; CNII-XII intact without focal deficit   Assessment:  1. LUQ pain   2. Generalized abdominal pain     Plan:  ? Etiology; will try changing from Omeprazole 20 mg to Protonix 40 mg daily; will check CBC, CMP, U/A, lipase and amylase today; update abdominal ultrasound; follow-up to be determined.   No Follow-up on file.  Orders Placed This Encounter  Procedures  . US Abdomen Complete    Standing Status:   Future    Standing Expiration Date:   06/15/2018    Order Specific Question:   Reason for Exam (SYMPTOM  OR DIAGNOSIS REQUIRED)    Answer:   abdominal pain    Order Specific Question:   Preferred imaging location?    Answer:   [facility]  . CBC w/Diff    Standing Status:   Future    Number of Occurrences:   1    Standing Expiration Date:   04/16/2018  . Comp Met (CMET)    Standing Status:   Future    Number of Occurrences:   1    Standing Expiration Date:   04/16/2018  . Amylase    Standing Status:   Future    Number of Occurrences:   1    Standing Expiration Date:   04/16/2018  .  Lipase    Standing Status:   Future    Number of Occurrences:   1    Standing Expiration Date:   04/16/2018  . Urinalysis    Standing Status:   Future    Number of Occurrences:   1    Standing Expiration Date:   04/16/2018    Requested Prescriptions   Signed Prescriptions Disp Refills  . pantoprazole (PROTONIX) 40 MG tablet 30 tablet 1    Sig: Take 1 tablet (40 mg total) by mouth daily.

## 2017-04-16 NOTE — Patient Instructions (Signed)
Stop your Prilosec and change to Protonix; the Protonix is once a day; Please get your labs done; we will schedule your ultrasound;

## 2017-05-22 ENCOUNTER — Ambulatory Visit
Admission: RE | Admit: 2017-05-22 | Discharge: 2017-05-22 | Disposition: A | Discharge status: Home / Self Care | Payer: Medicare Other | Source: Ambulatory Visit | Attending: Family | Admitting: Family

## 2017-05-22 DIAGNOSIS — R1084 Generalized abdominal pain: Secondary | ICD-10-CM

## 2017-05-22 DIAGNOSIS — R109 Unspecified abdominal pain: Secondary | ICD-10-CM | POA: Diagnosis not present

## 2017-06-07 ENCOUNTER — Other Ambulatory Visit: Payer: Self-pay | Admitting: Family

## 2017-06-15 ENCOUNTER — Other Ambulatory Visit: Payer: Self-pay | Admitting: Internal Medicine

## 2017-06-19 ENCOUNTER — Encounter: Payer: Self-pay | Admitting: Internal Medicine

## 2017-06-19 ENCOUNTER — Ambulatory Visit (INDEPENDENT_AMBULATORY_CARE_PROVIDER_SITE_OTHER): Payer: Medicare Other | Admitting: Internal Medicine

## 2017-06-19 ENCOUNTER — Ambulatory Visit: Payer: Self-pay

## 2017-06-19 ENCOUNTER — Telehealth: Payer: Self-pay | Admitting: Internal Medicine

## 2017-06-19 VITALS — BP 120/70 | HR 69 | Temp 98.9°F | Resp 16 | Ht 66.0 in | Wt 205.5 lb

## 2017-06-19 DIAGNOSIS — J029 Acute pharyngitis, unspecified: Secondary | ICD-10-CM | POA: Diagnosis not present

## 2017-06-19 DIAGNOSIS — J020 Streptococcal pharyngitis: Secondary | ICD-10-CM | POA: Diagnosis not present

## 2017-06-19 LAB — POCT RAPID STREP A (OFFICE): RAPID STREP A SCREEN: POSITIVE — AB

## 2017-06-19 MED ORDER — AMOXICILLIN 875 MG PO TABS: 875.0000 mg | ORAL_TABLET | Freq: Two times a day (BID) | ORAL | 0 refills | Status: AC

## 2017-06-19 NOTE — Telephone Encounter (Signed)
[facility] received call last night at 8:05pm.  Stating that patient had temp of 104, sore throat and body ache.  Patient was advised to go to ED.  Patient does have appt scheduled today with Dr. Yetta Barre.

## 2017-06-19 NOTE — Telephone Encounter (Signed)
Pt.'s daughter reports pt. Started yesterday with sore throat, body aches and headache. Temp last night 103. Temp this morning is 97.6 by mouth. Appointment made for today.  Reason for Disposition . SEVERE (e.g., excruciating) throat pain  Answer Assessment - Initial Assessment Questions 1. ONSET: "When did the throat start hurting?" (Hours or days ago)      Started yesterday 2. SEVERITY: "How bad is the sore throat?" (Scale 1-10; mild, moderate or severe)   - MILD (1-3):  doesn't interfere with eating or normal activities   - MODERATE (4-7): interferes with eating some solids and normal activities   - SEVERE (8-10):  excruciating pain, interferes with most normal activities   - SEVERE DYSPHAGIA: can't swallow liquids, drooling     Moderate 3. STREP EXPOSURE: "Has there been any exposure to strep within the past week?" If so, ask: "What type of contact occurred?"      No 4.  VIRAL SYMPTOMS: "Are there any symptoms of a cold, such as a runny nose, cough, hoarse voice or red eyes?"      103 temp last night 5. FEVER: "Do you have a fever?" If so, ask: "What is your temperature, how was it measured, and when did it start?"     Temp 97.6 this morning 6. PUS ON THE TONSILS: "Is there pus on the tonsils in the back of your throat?"     No spots, just red 7. OTHER SYMPTOMS: "Do you have any other symptoms?" (e.g., difficulty breathing, headache, rash)     Headache 8. PREGNANCY: "Is there any chance you are pregnant?" "When was your last menstrual period?"     No  Protocols used: SORE THROAT-A-AH

## 2017-06-19 NOTE — Progress Notes (Signed)
Subjective:  Patient ID: Susan Brady, female    DOB: 13-Oct-1945  Age: 72 y.o. MRN: [id number]  CC: Sore Throat   HPI Susan Brady presents for a 2 day hx of ST, odynophagia, LAD, and fever.  Outpatient Medications Prior to Visit  Medication Sig Dispense Refill  . acetaminophen (TYLENOL) 500 MG tablet Take 500-1,000 mg by mouth every 6 (six) hours as needed for mild pain.    Marland Kitchen aspirin EC 81 MG tablet Take 1 tablet (81 mg total) by mouth daily.    Marland Kitchen atorvastatin (LIPITOR) 20 MG tablet TAKE 1 TABLET(20 MG) BY MOUTH DAILY 90 tablet 1  . cholecalciferol (VITAMIN D) 1000 UNITS tablet Take 1,000 Units by mouth daily.     . furosemide (LASIX) 20 MG tablet TAKE 1 TABLET BY MOUTH TWICE DAILY- 180 tablet 1  . Garlic 1000 MG CAPS Take 1,000 mg by mouth daily.    . hydrALAZINE (APRESOLINE) 50 MG tablet Take 1.5 tablets (75 mg total) by mouth 3 (three) times daily. --- Office visit needed for further refills 405 tablet 0  . hydrochlorothiazide (HYDRODIURIL) 25 MG tablet TAKE 1 TABLET(25 MG) BY MOUTH DAILY 90 tablet 1  . metoprolol tartrate (LOPRESSOR) 25 MG tablet Take 1 tablet (25 mg total) by mouth daily.    Marland Kitchen nystatin (NYSTATIN) powder APPLY TO ABDOMEN FOLDS AND UNDER BREAST 2-3 TIMES DAILY 60 g 5  . omega-3 acid ethyl esters (LOVAZA) 1 G capsule Take 1 g by mouth daily.     . pantoprazole (PROTONIX) 40 MG tablet Take 1 tablet (40 mg total) by mouth daily. 30 tablet 1  . potassium chloride SA (K-DUR,KLOR-CON) 20 MEQ tablet Take 1 tablet (20 mEq total) by mouth daily. -- Office visit needed for further refills 90 tablet 0  . vitamin E 400 UNIT capsule Take 400 Units by mouth daily.     No facility-administered medications prior to visit.     ROS Review of Systems  Constitutional: Positive for chills and fever. Negative for appetite change, diaphoresis and fatigue.  HENT: Positive for sore throat and trouble swallowing. Negative for sinus pressure and voice change.   Eyes: Negative for  visual disturbance.  Respiratory: Negative for cough, chest tightness and shortness of breath.   Cardiovascular: Negative for chest pain, palpitations and leg swelling.  Gastrointestinal: Negative for abdominal pain, diarrhea and nausea.  Endocrine: Negative.   Genitourinary: Negative.  Negative for difficulty urinating.  Musculoskeletal: Negative.  Negative for arthralgias, back pain, myalgias and neck pain.  Skin: Negative.   Neurological: Negative.  Negative for weakness and headaches.  Hematological: Positive for adenopathy. Does not bruise/bleed easily.  Psychiatric/Behavioral: Negative.     Objective:  BP 120/70 (BP Location: Left Arm, Patient Position: Sitting, Cuff Size: Large)   Pulse 69   Temp 98.9 F (37.2 C) (Oral)   Resp 16   Ht  (1.676 m)   Wt 205 lb 8 oz (93.2 kg)   SpO2 97%   BMI 33.17 kg/m   BP Readings from Last 3 Encounters:  06/19/17 120/70  04/16/17 122/70  03/31/17 120/72    Wt Readings from Last 3 Encounters:  06/19/17 205 lb 8 oz (93.2 kg)  04/16/17 210 lb (95.3 kg)  03/31/17 208 lb (94.3 kg)    Physical Exam  Constitutional: She is oriented to person, place, and time.  Non-toxic appearance. She does not appear ill. No distress.  HENT:  Mouth/Throat: Mucous membranes are normal. Mucous membranes are not pale, not  dry and not cyanotic. No oral lesions. No trismus in the jaw. No uvula swelling. Posterior oropharyngeal erythema present. No oropharyngeal exudate, posterior oropharyngeal edema or tonsillar abscesses.  Is moderate bilateral tonsillar hypertrophy and erythema  Neck: Normal range of motion. Neck supple. No thyromegaly present.  Cardiovascular: Normal rate, regular rhythm and normal heart sounds. Exam reveals no gallop and no friction rub.  No murmur heard. Pulmonary/Chest: Effort normal and breath sounds normal. No stridor. No respiratory distress. She has no wheezes. She has no rales.  Abdominal: Bowel sounds are normal. There is no  tenderness.  Musculoskeletal: Normal range of motion. She exhibits no edema, tenderness or deformity.  Lymphadenopathy:    She has cervical adenopathy.  Neurological: She is alert and oriented to person, place, and time.  Skin: Skin is warm and dry. No rash noted. She is not diaphoretic. No erythema.  Vitals reviewed.   Lab Results  Component Value Date   WBC 5.7 04/16/2017   HGB 11.8 (L) 04/16/2017   HCT 35.9 (L) 04/16/2017   PLT 342.0 04/16/2017   GLUCOSE 97 04/16/2017   CHOL 113 12/20/2016   TRIG 87.0 12/20/2016   HDL 51.90 12/20/2016   LDLCALC 44 12/20/2016   ALT 14 04/16/2017   AST 18 04/16/2017   NA 141 04/16/2017   K 3.9 04/16/2017   CL 103 04/16/2017   CREATININE 0.66 04/16/2017   BUN 15 04/16/2017   CO2 31 04/16/2017   TSH 0.549 07/23/2016   INR 1.17 07/25/2016   HGBA1C 6.1 12/20/2016   MICROALBUR 2.2 (H) 12/20/2016    US Abdomen Complete  Result Date: 05/22/2017 CLINICAL DATA:  Abdominal pain for 2 weeks. EXAM: ABDOMEN ULTRASOUND COMPLETE COMPARISON:  Ultrasound 08/12/2015.  CT 08/12/2015. FINDINGS: Gallbladder: No gallstones or wall thickening visualized. No sonographic Murphy sign noted by sonographer. Common bile duct: Diameter: 5.2 mm Liver: Hepatic parenchymal pattern is slightly heterogeneous but unchanged from prior exam of 08/12/2015. Portal vein is patent on color Doppler imaging with normal direction of blood flow towards the liver. IVC: No abnormality visualized. Pancreas: Visualized portion unremarkable. Spleen: Size and appearance within normal limits. Right Kidney: Length: 10.4 cm. Echogenicity within normal limits. No mass or hydronephrosis visualized. Left Kidney: Length: 11.1 cm. Echogenicity within normal limits. No mass or hydronephrosis visualized. Abdominal aorta: No aneurysm visualized. Other findings: None. IMPRESSION: No acute or focal abnormality identified. No gallstones or biliary distention. Electronically Signed   By: Maisie Fus  Register   On:  05/22/2017 10:52    Assessment & Plan:   Susan Brady was seen today for sore throat.  Diagnoses and all orders for this visit:  Sore throat- Her rapid strep screen is positive. -     POCT rapid strep A  Strep throat- Will treat with a 10-day course of amoxicillin. -     amoxicillin (AMOXIL) 875 MG tablet; Take 1 tablet (875 mg total) by mouth 2 (two) times daily for 10 days.   I am having Liz Beach start on amoxicillin. I am also having her maintain her omega-3 acid ethyl esters, vitamin E, cholecalciferol, Garlic, aspirin EC, nystatin, acetaminophen, metoprolol tartrate, hydrochlorothiazide, atorvastatin, furosemide, hydrALAZINE, pantoprazole, and potassium chloride SA.  Meds ordered this encounter  Medications  . amoxicillin (AMOXIL) 875 MG tablet    Sig: Take 1 tablet (875 mg total) by mouth 2 (two) times daily for 10 days.    Dispense:  20 tablet    Refill:  0     Follow-up: Return if symptoms worsen or  fail to improve.  Scarlette Calico, MD

## 2017-06-19 NOTE — Patient Instructions (Signed)
Strep Throat Strep throat is a bacterial infection of the throat. Your health care provider may call the infection tonsillitis or pharyngitis, depending on whether there is swelling in the tonsils or at the back of the throat. Strep throat is most common during the cold months of the year in children who are 5-73 years of age, but it can happen during any season in people of any age. This infection is spread from person to person (contagious) through coughing, sneezing, or close contact. What are the causes? Strep throat is caused by the bacteria called Streptococcus pyogenes. What increases the risk? This condition is more likely to develop in:  People who spend time in crowded places where the infection can spread easily.  People who have close contact with someone who has strep throat.  What are the signs or symptoms? Symptoms of this condition include:  Fever or chills.  Redness, swelling, or pain in the tonsils or throat.  Pain or difficulty when swallowing.  White or yellow spots on the tonsils or throat.  Swollen, tender glands in the neck or under the jaw.  Red rash all over the body (rare).  How is this diagnosed? This condition is diagnosed by performing a rapid strep test or by taking a swab of your throat (throat culture test). Results from a rapid strep test are usually ready in a few minutes, but throat culture test results are available after one or two days. How is this treated? This condition is treated with antibiotic medicine. Follow these instructions at home: Medicines  Take over-the-counter and prescription medicines only as told by your health care provider.  Take your antibiotic as told by your health care provider. Do not stop taking the antibiotic even if you start to feel better.  Have family members who also have a sore throat or fever tested for strep throat. They may need antibiotics if they have the strep infection. Eating and drinking  Do not  share food, drinking cups, or personal items that could cause the infection to spread to other people.  If swallowing is difficult, try eating soft foods until your sore throat feels better.  Drink enough fluid to keep your urine clear or pale yellow. General instructions  Gargle with a salt-water mixture 3-4 times per day or as needed. To make a salt-water mixture, completely dissolve -1 tsp of salt in 1 cup of warm water.  Make sure that all household members wash their hands well.  Get plenty of rest.  Stay home from school or work until you have been taking antibiotics for 24 hours.  Keep all follow-up visits as told by your health care provider. This is important. Contact a health care provider if:  The glands in your neck continue to get bigger.  You develop a rash, cough, or earache.  You cough up a thick liquid that is green, yellow-brown, or bloody.  You have pain or discomfort that does not get better with medicine.  Your problems seem to be getting worse rather than better.  You have a fever. Get help right away if:  You have new symptoms, such as vomiting, severe headache, stiff or painful neck, chest pain, or shortness of breath.  You have severe throat pain, drooling, or changes in your voice.  You have swelling of the neck, or the skin on the neck becomes red and tender.  You have signs of dehydration, such as fatigue, dry mouth, and decreased urination.  You become increasingly sleepy, or   you cannot wake up completely.  Your joints become red or painful. This information is not intended to replace advice given to you by your health care provider. Make sure you discuss any questions you have with your health care provider. Document Released: 02/02/2000 Document Revised: 10/04/2015 Document Reviewed: 05/30/2014 Elsevier Interactive Patient Education  2018 Elsevier Inc.  

## 2017-06-19 NOTE — Telephone Encounter (Signed)
Noted  

## 2017-06-20 ENCOUNTER — Other Ambulatory Visit: Payer: Self-pay | Admitting: Family

## 2017-06-20 MED ORDER — PANTOPRAZOLE SODIUM 40 MG PO TBEC: 40.0000 mg | DELAYED_RELEASE_TABLET | Freq: Every day | ORAL | 1 refills | Status: DC

## 2017-06-24 ENCOUNTER — Other Ambulatory Visit: Payer: Self-pay | Admitting: Internal Medicine

## 2017-07-15 NOTE — Progress Notes (Signed)
Subjective:    Patient ID: Susan Brady, female    DOB: 19-Aug-1945, 72 y.o.   MRN: [id number]  HPI The patient is here for an acute visit.  Small white pus filled bumps on hands, stomach, legs and feet:  She noticed this more than 1 week ago.  They do not hurt, but she can feel them when she washes.  There was minimal itching.  She denies any fever.  She squeezes them and pus comes out.  They will leave scars.  She has not noticed any new ones in a few days.  She denies prior episodes.    She denies new exposures - medications or products.  She denies other skin problems currently.  She denies a history of eczema or bug bites.  She denies bug bites.  She does garden.     Medications and allergies reviewed with patient and updated if appropriate.  Patient Active Problem List   Diagnosis Date Noted  . Rash and nonspecific skin eruption 07/16/2017  . Strep throat 06/19/2017  . Acute bronchitis 03/31/2017  . Flu-like symptoms 02/25/2017  . Poor balance 12/20/2016  . Abnormal urine color 12/20/2016  . Right flank pain 12/20/2016  . Acute left-sided low back pain without sciatica 12/03/2016  . Normal coronary arteries 08/07/2016  . Cardiomegaly 08/07/2016  . Angina at rest [facility]) 07/25/2016  . Abnormal stress test   . Chest pain of unknown etiology 07/23/2016  . Bradycardia 07/23/2016  . Dupuytren's contracture of right hand 07/19/2016  . Hyperlipidemia 06/25/2016  . Neck pain on right side 11/14/2015  . Poor sleep pattern 11/14/2015  . Constipation 11/14/2015  . Hiatal hernia, large 08/30/2015  . Gastric ulcer 08/30/2015  . Anorexia 08/30/2015  . Unsteadiness 08/30/2015  . Gastritis 08/15/2015  . Left shoulder pain 06/22/2015  . Essential hypertension 11/22/2014  . Non-insulin dependent type 2 diabetes mellitus (HCC) 11/22/2014  . Arthritis 11/22/2014  . Glaucoma 11/22/2014  . GERD (gastroesophageal reflux disease) 11/22/2014  . Varicose veins 11/22/2014  . Myocardial  infarct, old 11/22/2014  . Superficial thrombophlebitis of lower extremity 11/22/2014    Current Outpatient Medications on File Prior to Visit  Medication Sig Dispense Refill  . acetaminophen (TYLENOL) 500 MG tablet Take 500-1,000 mg by mouth every 6 (six) hours as needed for mild pain.    Marland Kitchen aspirin EC 81 MG tablet Take 1 tablet (81 mg total) by mouth daily.    Marland Kitchen atorvastatin (LIPITOR) 20 MG tablet TAKE 1 TABLET(20 MG) BY MOUTH DAILY 90 tablet 1  . cholecalciferol (VITAMIN D) 1000 UNITS tablet Take 1,000 Units by mouth daily.     . furosemide (LASIX) 20 MG tablet TAKE 1 TABLET BY MOUTH TWICE DAILY- 180 tablet 1  . Garlic 1000 MG CAPS Take 1,000 mg by mouth daily.    . hydrALAZINE (APRESOLINE) 50 MG tablet Take 1.5 tablets (75 mg total) by mouth 3 (three) times daily. --- Office visit needed for further refills 405 tablet 0  . hydrochlorothiazide (HYDRODIURIL) 25 MG tablet TAKE 1 TABLET(25 MG) BY MOUTH DAILY 90 tablet 1  . metoprolol tartrate (LOPRESSOR) 25 MG tablet Take 1 tablet (25 mg total) by mouth daily.    . metoprolol tartrate (LOPRESSOR) 50 MG tablet Take 1 tablet (50 mg total) by mouth 2 (two) times daily. -- Office visit needed for further refills 60 tablet 0  . nystatin (NYSTATIN) powder APPLY TO ABDOMEN FOLDS AND UNDER BREAST 2-3 TIMES DAILY 60 g 5  . omega-3 acid  ethyl esters (LOVAZA) 1 G capsule Take 1 g by mouth daily.     . pantoprazole (PROTONIX) 40 MG tablet TAKE 1 TABLET(40 MG) BY MOUTH DAILY 90 tablet 0  . potassium chloride SA (K-DUR,KLOR-CON) 20 MEQ tablet TAKE 1 TABLET BY MOUTH DAILY-- OFFICE VISIT NEEDED FOR FURTHER REFILLS 30 tablet 0  . vitamin E 400 UNIT capsule Take 400 Units by mouth daily.     No current facility-administered medications on file prior to visit.     Past Medical History:  Diagnosis Date  . Anemia   . CHF (congestive heart failure) (HCC)   . Diabetes mellitus without complication (HCC)   . Gastric ulcer   . Glaucoma   . Hiatal hernia   .  Hyperlipidemia   . Hypertension     Past Surgical History:  Procedure Laterality Date  . LEFT HEART CATH AND CORONARY ANGIOGRAPHY N/A 07/25/2016   Procedure: Left Heart Cath and Coronary Angiography;  Surgeon: Kathleene Hazel, MD;  Location: [facility] INVASIVE CV LAB;  Service: Cardiovascular;  Laterality: N/A;    Social History   Socioeconomic History  . Marital status: Divorced    Spouse name: Not on file  . Number of children: Not on file  . Years of education: Not on file  . Highest education level: Not on file  Occupational History  . Not on file  Social Needs  . Financial resource strain: Not on file  . Food insecurity:    Worry: Not on file    Inability: Not on file  . Transportation needs:    Medical: Not on file    Non-medical: Not on file  Tobacco Use  . Smoking status: Former Games developer  . Smokeless tobacco: Never Used  Substance and Sexual Activity  . Alcohol use: No    Alcohol/week: 0.0 oz  . Drug use: No  . Sexual activity: Not on file  Lifestyle  . Physical activity:    Days per week: Not on file    Minutes per session: Not on file  . Stress: Not on file  Relationships  . Social connections:    Talks on phone: Not on file    Gets together: Not on file    Attends religious service: Not on file    Active member of club or organization: Not on file    Attends meetings of clubs or organizations: Not on file    Relationship status: Not on file  Other Topics Concern  . Not on file  Social History Narrative  . Not on file    Family History  Problem Relation Age of Onset  . Heart disease Mother   . Alcohol abuse Father   . Alcohol abuse Sister   . Diabetes Sister   . Hypertension Sister   . Cancer Sister   . CAD Sister        One sister with stents  . Alcohol abuse Brother   . Diabetes Brother   . Hypertension Brother     Review of Systems  Constitutional: Negative for chills and fever.  Skin: Positive for wound (no active lesions). Negative  for color change and rash.       Objective:   Vitals:   07/16/17 0846  BP: 130/70  Pulse: (!) 58  Resp: 16  Temp: 98.3 F (36.8 C)  SpO2: 96%   BP Readings from Last 3 Encounters:  07/16/17 130/70  06/19/17 120/70  04/16/17 122/70   Wt Readings from Last 3 Encounters:  07/16/17 212 lb (96.2 kg)  06/19/17 205 lb 8 oz (93.2 kg)  04/16/17 210 lb (95.3 kg)   Body mass index is 34.22 kg/m.   Physical Exam  Constitutional: She appears well-developed and well-nourished. No distress.  HENT:  Head: Normocephalic and atraumatic.  Musculoskeletal: She exhibits no edema.  Skin: Skin is warm and dry. No rash noted. She is not diaphoretic. No erythema.  A few small ( less than a pencil eraser) scars on palm of left hand, a couple of right palm and a couple of legs, left foot. No active lesion with pus.            Assessment & Plan:    See Problem List for Assessment and Plan of chronic medical problems.

## 2017-07-16 ENCOUNTER — Encounter: Payer: Self-pay | Admitting: Internal Medicine

## 2017-07-16 ENCOUNTER — Ambulatory Visit (INDEPENDENT_AMBULATORY_CARE_PROVIDER_SITE_OTHER): Payer: Medicare Other | Admitting: Internal Medicine

## 2017-07-16 VITALS — BP 130/70 | HR 58 | Temp 98.3°F | Resp 16 | Wt 212.0 lb

## 2017-07-16 DIAGNOSIS — R21 Rash and other nonspecific skin eruption: Secondary | ICD-10-CM | POA: Insufficient documentation

## 2017-07-16 MED ORDER — TRIAMCINOLONE ACETONIDE 0.1 % EX CREA: 1.0000 "application " | TOPICAL_CREAM | Freq: Two times a day (BID) | CUTANEOUS | 0 refills | Status: DC

## 2017-07-16 NOTE — Patient Instructions (Signed)
If you develop another lesion use the steroid cream twice for a few days.    If you continue to have bumps let me know so we can help get you in to see a dermatologist.

## 2017-07-16 NOTE — Assessment & Plan Note (Signed)
Pus filled lesions x > 1 week, no new lesions in several days - only visible scars No obvious cause ? Still a problem or resolved w/o treatment Will prescribed triamcinolone cream to have at home  - if she has another lesion - she can try BID for a few days If continues to have lesions - will refer to derm

## 2017-07-24 ENCOUNTER — Other Ambulatory Visit: Payer: Self-pay | Admitting: Internal Medicine

## 2017-07-24 ENCOUNTER — Other Ambulatory Visit: Payer: Self-pay | Admitting: Family

## 2017-07-25 ENCOUNTER — Other Ambulatory Visit: Payer: Self-pay | Admitting: Family

## 2017-08-23 ENCOUNTER — Other Ambulatory Visit: Payer: Self-pay | Admitting: Internal Medicine

## 2017-08-25 ENCOUNTER — Other Ambulatory Visit: Payer: Self-pay | Admitting: Internal Medicine

## 2017-09-14 ENCOUNTER — Other Ambulatory Visit: Payer: Self-pay | Admitting: Family

## 2017-09-22 ENCOUNTER — Other Ambulatory Visit: Payer: Self-pay | Admitting: Internal Medicine

## 2017-10-22 ENCOUNTER — Other Ambulatory Visit: Payer: Self-pay | Admitting: Internal Medicine

## 2017-11-11 ENCOUNTER — Other Ambulatory Visit: Payer: Self-pay | Admitting: Internal Medicine

## 2017-11-11 MED ORDER — HYDRALAZINE HCL 50 MG PO TABS: ORAL_TABLET | ORAL | 0 refills | Status: DC

## 2017-11-11 NOTE — Telephone Encounter (Signed)
Copied from CRM [phone]#[id number]. Topic: Quick Communication - Rx Refill/Question >> Nov 11, 2017 11:42 AM Lorrine KinMcGee, Zephan Beauchaine B, NT wrote: **Patient is out of town on a family emergency. Daughter states that she will have her make an appointment when she arrives back in town.**   Medication: atorvastatin (LIPITOR) 20 MG tablet   Has the patient contacted their pharmacy? Yes.   (Agent: If no, request that the patient contact the pharmacy for the refill.) (Agent: If yes, when and what did the pharmacy advise?)  Preferred Pharmacy (with phone number or street name): [id number] - [place], [place] - [address] AT [address] [place]  Agent: Please be advised that RX refills may take up to 3 business days. We ask that you follow-up with your pharmacy.

## 2017-11-11 NOTE — Telephone Encounter (Signed)
Sent 1 month supply to requested pharmacy.Marland Kitchen.Raechel Chute/lmb

## 2017-11-11 NOTE — Telephone Encounter (Signed)
Lipitor 20 mg refill Last Refill:09/22/17 # 90  0 refills Last OV: 07/16/17 PCP: Lawerance BachBurns Pharmacy:[facility] [id number], [place]  [address]  Pt is out of town on a family emergency.   Daughter states that she will have her make an appt when she arrives back in town.

## 2017-11-11 NOTE — Telephone Encounter (Signed)
Spoke to daughter to clarify, patient should not be out of LIPITOR,   She double checked and the medication they need is HYDRALAZINE,   [facility] [id number] [place]  [id number]  Patient will call back to make appt she had a famiyl emergency out of town.  Please advise

## 2017-11-21 ENCOUNTER — Other Ambulatory Visit: Payer: Self-pay | Admitting: Internal Medicine

## 2017-12-03 NOTE — Progress Notes (Signed)
Subjective:    Patient ID: Susan Brady, female    DOB: 09-23-1945, 72 y.o.   MRN: [id number]  HPI      Medications and allergies reviewed with patient and updated if appropriate.  Patient Active Problem List   Diagnosis Date Noted  . Rash and nonspecific skin eruption 07/16/2017  . Poor balance 12/20/2016  . Right flank pain 12/20/2016  . Acute left-sided low back pain without sciatica 12/03/2016  . Normal coronary arteries 08/07/2016  . Cardiomegaly 08/07/2016  . Angina at rest [facility]) 07/25/2016  . Abnormal stress test   . Chest pain of unknown etiology 07/23/2016  . Bradycardia 07/23/2016  . Dupuytren's contracture of right hand 07/19/2016  . Hyperlipidemia 06/25/2016  . Neck pain on right side 11/14/2015  . Poor sleep pattern 11/14/2015  . Constipation 11/14/2015  . Hiatal hernia, large 08/30/2015  . Gastric ulcer 08/30/2015  . Anorexia 08/30/2015  . Gastritis 08/15/2015  . Left shoulder pain 06/22/2015  . Essential hypertension 11/22/2014  . Non-insulin dependent type 2 diabetes mellitus (HCC) 11/22/2014  . Arthritis 11/22/2014  . Glaucoma 11/22/2014  . GERD (gastroesophageal reflux disease) 11/22/2014  . Varicose veins 11/22/2014  . Myocardial infarct, old 11/22/2014  . Superficial thrombophlebitis of lower extremity 11/22/2014    Current Outpatient Medications on File Prior to Visit  Medication Sig Dispense Refill  . acetaminophen (TYLENOL) 500 MG tablet Take 500-1,000 mg by mouth every 6 (six) hours as needed for mild pain.    Marland Kitchen aspirin EC 81 MG tablet Take 1 tablet (81 mg total) by mouth daily.    Marland Kitchen atorvastatin (LIPITOR) 20 MG tablet Take 1 tablet (20 mg total) by mouth daily. -- Office visit needed for further refills 90 tablet 0  . cholecalciferol (VITAMIN D) 1000 UNITS tablet Take 1,000 Units by mouth daily.     . furosemide (LASIX) 20 MG tablet TAKE 1 TABLET BY MOUTH TWICE DAILY 180 tablet 0  . Garlic 1000 MG CAPS Take 1,000 mg by mouth daily.    .  hydrALAZINE (APRESOLINE) 50 MG tablet TAKE 1 AND 1/2 TABLET BY MOUTH THREE TIMES DAILY 135 tablet 0  . hydrochlorothiazide (HYDRODIURIL) 25 MG tablet TAKE 1 TABLET(25 MG) BY MOUTH DAILY -- Office visit needed for further refills 90 tablet 0  . metoprolol tartrate (LOPRESSOR) 25 MG tablet Take 1 tablet (25 mg total) by mouth daily.    . metoprolol tartrate (LOPRESSOR) 50 MG tablet TAKE 1 TABLET BY MOUTH TWICE DAILY...OFFICE VISIT NEEDED FOR FURTHER REFILLS 60 tablet 0  . nystatin (NYSTATIN) powder APPLY TO ABDOMEN FOLDS AND UNDER BREAST 2-3 TIMES DAILY 60 g 5  . omega-3 acid ethyl esters (LOVAZA) 1 G capsule Take 1 g by mouth daily.     . pantoprazole (PROTONIX) 40 MG tablet Take 1 tablet (40 mg total) by mouth daily. -- Office visit needed for further refills 90 tablet 0  . potassium chloride SA (K-DUR,KLOR-CON) 20 MEQ tablet TAKE 1 TABLET BY MOUTH DAILY-- OFFICE VISIT NEEDED FOR FURTHER REFILLS 30 tablet 0  . triamcinolone cream (KENALOG) 0.1 % Apply 1 application topically 2 (two) times daily. 30 g 0  . vitamin E 400 UNIT capsule Take 400 Units by mouth daily.     No current facility-administered medications on file prior to visit.     Past Medical History:  Diagnosis Date  . Anemia   . CHF (congestive heart failure) (HCC)   . Diabetes mellitus without complication (HCC)   . Gastric ulcer   .  Glaucoma   . Hiatal hernia   . Hyperlipidemia   . Hypertension     Past Surgical History:  Procedure Laterality Date  . LEFT HEART CATH AND CORONARY ANGIOGRAPHY N/A 07/25/2016   Procedure: Left Heart Cath and Coronary Angiography;  Surgeon: Kathleene Hazel, MD;  Location: [facility] INVASIVE CV LAB;  Service: Cardiovascular;  Laterality: N/A;    Social History   Socioeconomic History  . Marital status: Divorced    Spouse name: Not on file  . Number of children: Not on file  . Years of education: Not on file  . Highest education level: Not on file  Occupational History  . Not on file    Social Needs  . Financial resource strain: Not on file  . Food insecurity:    Worry: Not on file    Inability: Not on file  . Transportation needs:    Medical: Not on file    Non-medical: Not on file  Tobacco Use  . Smoking status: Former Games developer  . Smokeless tobacco: Never Used  Substance and Sexual Activity  . Alcohol use: No    Alcohol/week: 0.0 standard drinks  . Drug use: No  . Sexual activity: Not on file  Lifestyle  . Physical activity:    Days per week: Not on file    Minutes per session: Not on file  . Stress: Not on file  Relationships  . Social connections:    Talks on phone: Not on file    Gets together: Not on file    Attends religious service: Not on file    Active member of club or organization: Not on file    Attends meetings of clubs or organizations: Not on file    Relationship status: Not on file  Other Topics Concern  . Not on file  Social History Narrative  . Not on file    Family History  Problem Relation Age of Onset  . Heart disease Mother   . Alcohol abuse Father   . Alcohol abuse Sister   . Diabetes Sister   . Hypertension Sister   . Cancer Sister   . CAD Sister        One sister with stents  . Alcohol abuse Brother   . Diabetes Brother   . Hypertension Brother     Review of Systems     Objective:  There were no vitals filed for this visit. BP Readings from Last 3 Encounters:  07/16/17 130/70  06/19/17 120/70  04/16/17 122/70   Wt Readings from Last 3 Encounters:  07/16/17 212 lb (96.2 kg)  06/19/17 205 lb 8 oz (93.2 kg)  04/16/17 210 lb (95.3 kg)   There is no height or weight on file to calculate BMI.   Physical Exam          Assessment & Plan:    See Problem List for Assessment and Plan of chronic medical problems.   This encounter was created in error - please disregard.

## 2017-12-04 ENCOUNTER — Encounter: Payer: Medicare Other | Admitting: Internal Medicine

## 2017-12-04 DIAGNOSIS — Z0289 Encounter for other administrative examinations: Secondary | ICD-10-CM

## 2017-12-04 NOTE — Progress Notes (Signed)
Subjective:    Patient ID: Susan Brady, female    DOB: 05-Feb-1946, 73 y.o.   MRN: [id number]  HPI The patient is here for follow up.  Hypertension: She is taking her medication daily. She is compliant with a low sodium diet.  She denies chest pain, palpitations, edema, shortness of breath and regular headaches. She is exercising some - walks her dog a little.      Hyperlipidemia: She is taking her medication daily. She is compliant with a low fat/cholesterol diet. She is not exercising regularly. She denies myalgias.   GERD:  She is taking her medication daily as prescribed.  She denies any GERD symptoms and feels her GERD is well controlled.   Diabetes: She is controlling her sugars with diet. She is compliant with a diabetic diet. She is not exercising regularly, but does walk her dog a little.  She checks her feet daily and denies foot lesions. She is not up-to-date with an ophthalmology examination.  She will make an appointment.    Left upper abdomen discomfort: When she does not go to the bathroom regularly she does have some discomfort in her left upper abdomen.  As long as she goes to the bathroom regularly she does not have that.  She takes Metamucil, but not on a daily basis.  That does help her go.  She has not tried taking this daily.   Medications and allergies reviewed with patient and updated if appropriate.  Patient Active Problem List   Diagnosis Date Noted  . Rash and nonspecific skin eruption 07/16/2017  . Poor balance 12/20/2016  . Right flank pain 12/20/2016  . Acute left-sided low back pain without sciatica 12/03/2016  . Normal coronary arteries 08/07/2016  . Cardiomegaly 08/07/2016  . Angina at rest [facility]) 07/25/2016  . Abnormal stress test   . Chest pain of unknown etiology 07/23/2016  . Bradycardia 07/23/2016  . Dupuytren's contracture of right hand 07/19/2016  . Hyperlipidemia 06/25/2016  . Neck pain on right side 11/14/2015  . Poor sleep pattern  11/14/2015  . Constipation 11/14/2015  . Hiatal hernia, large 08/30/2015  . Gastric ulcer 08/30/2015  . Anorexia 08/30/2015  . Gastritis 08/15/2015  . Left shoulder pain 06/22/2015  . Essential hypertension 11/22/2014  . Non-insulin dependent type 2 diabetes mellitus (HCC) 11/22/2014  . Arthritis 11/22/2014  . Glaucoma 11/22/2014  . GERD (gastroesophageal reflux disease) 11/22/2014  . Varicose veins 11/22/2014  . Myocardial infarct, old 11/22/2014  . Superficial thrombophlebitis of lower extremity 11/22/2014    Current Outpatient Medications on File Prior to Visit  Medication Sig Dispense Refill  . acetaminophen (TYLENOL) 500 MG tablet Take 500-1,000 mg by mouth every 6 (six) hours as needed for mild pain.    Marland Kitchen aspirin EC 81 MG tablet Take 1 tablet (81 mg total) by mouth daily.    . cholecalciferol (VITAMIN D) 1000 UNITS tablet Take 1,000 Units by mouth daily.     . furosemide (LASIX) 20 MG tablet TAKE 1 TABLET BY MOUTH TWICE DAILY 180 tablet 0  . Garlic 1000 MG CAPS Take 1,000 mg by mouth daily.    . metoprolol tartrate (LOPRESSOR) 25 MG tablet Take 1 tablet (25 mg total) by mouth daily.    Marland Kitchen nystatin (NYSTATIN) powder APPLY TO ABDOMEN FOLDS AND UNDER BREAST 2-3 TIMES DAILY 60 g 5  . omega-3 acid ethyl esters (LOVAZA) 1 G capsule Take 1 g by mouth daily.     Marland Kitchen triamcinolone cream (KENALOG) 0.1 %  Apply 1 application topically 2 (two) times daily. 30 g 0  . vitamin E 400 UNIT capsule Take 400 Units by mouth daily.     No current facility-administered medications on file prior to visit.     Past Medical History:  Diagnosis Date  . Anemia   . CHF (congestive heart failure) (HCC)   . Diabetes mellitus without complication (HCC)   . Gastric ulcer   . Glaucoma   . Hiatal hernia   . Hyperlipidemia   . Hypertension     Past Surgical History:  Procedure Laterality Date  . LEFT HEART CATH AND CORONARY ANGIOGRAPHY N/A 07/25/2016   Procedure: Left Heart Cath and Coronary  Angiography;  Surgeon: Kathleene Hazel, MD;  Location: [facility] INVASIVE CV LAB;  Service: Cardiovascular;  Laterality: N/A;    Social History   Socioeconomic History  . Marital status: Divorced    Spouse name: Not on file  . Number of children: Not on file  . Years of education: Not on file  . Highest education level: Not on file  Occupational History  . Not on file  Social Needs  . Financial resource strain: Not on file  . Food insecurity:    Worry: Not on file    Inability: Not on file  . Transportation needs:    Medical: Not on file    Non-medical: Not on file  Tobacco Use  . Smoking status: Former Games developer  . Smokeless tobacco: Never Used  Substance and Sexual Activity  . Alcohol use: No    Alcohol/week: 0.0 standard drinks  . Drug use: No  . Sexual activity: Not on file  Lifestyle  . Physical activity:    Days per week: Not on file    Minutes per session: Not on file  . Stress: Not on file  Relationships  . Social connections:    Talks on phone: Not on file    Gets together: Not on file    Attends religious service: Not on file    Active member of club or organization: Not on file    Attends meetings of clubs or organizations: Not on file    Relationship status: Not on file  Other Topics Concern  . Not on file  Social History Narrative  . Not on file    Family History  Problem Relation Age of Onset  . Heart disease Mother   . Alcohol abuse Father   . Alcohol abuse Sister   . Diabetes Sister   . Hypertension Sister   . Cancer Sister   . CAD Sister        One sister with stents  . Alcohol abuse Brother   . Diabetes Brother   . Hypertension Brother     Review of Systems  Constitutional: Negative for chills and fever.  Respiratory: Positive for shortness of breath (occasionally with walking). Negative for cough and wheezing.   Cardiovascular: Positive for palpitations (sometimes with exertion) and leg swelling (sometimes - mild). Negative for chest  pain.  Neurological: Positive for headaches (on left side of forhead). Negative for light-headedness.       Objective:   Vitals:   12/05/17 1010  BP: 122/64  Pulse: 60  Resp: 16  Temp: 98.2 F (36.8 C)  SpO2: 96%   BP Readings from Last 3 Encounters:  12/05/17 122/64  07/16/17 130/70  06/19/17 120/70   Wt Readings from Last 3 Encounters:  12/05/17 215 lb (97.5 kg)  07/16/17 212 lb (96.2 kg)  06/19/17 205 lb 8 oz (93.2 kg)   Body mass index is 34.7 kg/m.   Physical Exam    Constitutional: Appears well-developed and well-nourished. No distress.  HENT:  Head: Normocephalic and atraumatic.  Neck: Neck supple. No tracheal deviation present. No thyromegaly present.  No cervical lymphadenopathy Cardiovascular: Normal rate, regular rhythm and normal heart sounds.   No murmur heard. No carotid bruit .  No edema Pulmonary/Chest: Effort normal and breath sounds normal. No respiratory distress. No has no wheezes. No rales. Abdomen: Soft, nontender, nondistended Skin: Skin is warm and dry. Not diaphoretic.  Psychiatric: Normal mood and affect. Behavior is normal.      Assessment & Plan:    See Problem List for Assessment and Plan of chronic medical problems.

## 2017-12-05 ENCOUNTER — Other Ambulatory Visit: Payer: Self-pay | Admitting: Internal Medicine

## 2017-12-05 ENCOUNTER — Encounter: Payer: Self-pay | Admitting: Internal Medicine

## 2017-12-05 ENCOUNTER — Ambulatory Visit (INDEPENDENT_AMBULATORY_CARE_PROVIDER_SITE_OTHER): Payer: Medicare Other | Admitting: Internal Medicine

## 2017-12-05 ENCOUNTER — Other Ambulatory Visit (INDEPENDENT_AMBULATORY_CARE_PROVIDER_SITE_OTHER): Payer: Medicare Other

## 2017-12-05 VITALS — BP 122/64 | HR 60 | Temp 98.2°F | Resp 16 | Ht 66.0 in | Wt 215.0 lb

## 2017-12-05 DIAGNOSIS — E119 Type 2 diabetes mellitus without complications: Secondary | ICD-10-CM | POA: Diagnosis not present

## 2017-12-05 DIAGNOSIS — I10 Essential (primary) hypertension: Secondary | ICD-10-CM

## 2017-12-05 DIAGNOSIS — K219 Gastro-esophageal reflux disease without esophagitis: Secondary | ICD-10-CM | POA: Diagnosis not present

## 2017-12-05 DIAGNOSIS — Z23 Encounter for immunization: Secondary | ICD-10-CM | POA: Diagnosis not present

## 2017-12-05 DIAGNOSIS — K5900 Constipation, unspecified: Secondary | ICD-10-CM

## 2017-12-05 DIAGNOSIS — E7849 Other hyperlipidemia: Secondary | ICD-10-CM

## 2017-12-05 LAB — LIPID PANEL
CHOLESTEROL: 146 mg/dL (ref 0–200)
HDL: 66.5 mg/dL (ref >39.00)
LDL Cholesterol: 61 mg/dL (ref 0–99)
NONHDL: 79.96
Total CHOL/HDL Ratio: 2
Triglycerides: 97 mg/dL (ref 0.0–149.0)
VLDL: 19.4 mg/dL (ref 0.0–40.0)

## 2017-12-05 LAB — CBC WITH DIFFERENTIAL/PLATELET
BASOS ABS: 0 10*3/uL (ref 0.0–0.1)
Basophils Relative: 0.5 % (ref 0.0–3.0)
EOS ABS: 0.2 10*3/uL (ref 0.0–0.7)
EOS PCT: 3.9 % (ref 0.0–5.0)
HCT: 37 % (ref 36.0–46.0)
Hemoglobin: 12.3 g/dL (ref 12.0–15.0)
LYMPHS ABS: 2.5 10*3/uL (ref 0.7–4.0)
Lymphocytes Relative: 39.7 % (ref 12.0–46.0)
MCHC: 33.1 g/dL (ref 30.0–36.0)
MCV: 87.5 fl (ref 78.0–100.0)
MONO ABS: 0.8 10*3/uL (ref 0.1–1.0)
Monocytes Relative: 12.2 % — ABNORMAL HIGH (ref 3.0–12.0)
NEUTROS PCT: 43.7 % (ref 43.0–77.0)
Neutro Abs: 2.7 10*3/uL (ref 1.4–7.7)
Platelets: 312 10*3/uL (ref 150.0–400.0)
RBC: 4.22 Mil/uL (ref 3.87–5.11)
RDW: 13.4 % (ref 11.5–15.5)
WBC: 6.2 10*3/uL (ref 4.0–10.5)

## 2017-12-05 LAB — COMPREHENSIVE METABOLIC PANEL
ALBUMIN: 4.3 g/dL (ref 3.5–5.2)
ALK PHOS: 51 U/L (ref 39–117)
ALT: 11 U/L (ref 0–35)
AST: 16 U/L (ref 0–37)
BILIRUBIN TOTAL: 0.5 mg/dL (ref 0.2–1.2)
BUN: 12 mg/dL (ref 6–23)
CO2: 32 mEq/L (ref 19–32)
CREATININE: 0.72 mg/dL (ref 0.40–1.20)
Calcium: 9.7 mg/dL (ref 8.4–10.5)
Chloride: 103 mEq/L (ref 96–112)
GFR: 102.07 mL/min (ref >60.00)
GLUCOSE: 103 mg/dL — AB (ref 70–99)
Potassium: 3.7 mEq/L (ref 3.5–5.1)
SODIUM: 143 meq/L (ref 135–145)
TOTAL PROTEIN: 7.4 g/dL (ref 6.0–8.3)

## 2017-12-05 LAB — HEMOGLOBIN A1C: HEMOGLOBIN A1C: 6.1 % (ref 4.6–6.5)

## 2017-12-05 MED ORDER — PANTOPRAZOLE SODIUM 40 MG PO TBEC: 40.0000 mg | DELAYED_RELEASE_TABLET | Freq: Every day | ORAL | 0 refills | Status: DC

## 2017-12-05 MED ORDER — POTASSIUM CHLORIDE CRYS ER 20 MEQ PO TBCR: EXTENDED_RELEASE_TABLET | ORAL | 0 refills | Status: DC

## 2017-12-05 MED ORDER — HYDRALAZINE HCL 50 MG PO TABS: ORAL_TABLET | ORAL | 0 refills | Status: DC

## 2017-12-05 MED ORDER — ATORVASTATIN CALCIUM 20 MG PO TABS: 20.0000 mg | ORAL_TABLET | Freq: Every day | ORAL | 0 refills | Status: DC

## 2017-12-05 MED ORDER — HYDROCHLOROTHIAZIDE 25 MG PO TABS: ORAL_TABLET | ORAL | 0 refills | Status: DC

## 2017-12-05 MED ORDER — METOPROLOL TARTRATE 50 MG PO TABS: ORAL_TABLET | ORAL | 0 refills | Status: DC

## 2017-12-05 NOTE — Assessment & Plan Note (Signed)
BP well controlled Current regimen effective and well tolerated Continue current medications at current doses cmp  

## 2017-12-05 NOTE — Assessment & Plan Note (Signed)
Check lipid panel  Continue daily statin Regular exercise and healthy diet encouraged  

## 2017-12-05 NOTE — Patient Instructions (Addendum)
Take the metamucil daily.     Tests ordered today. Your results will be released to MyChart (or called to you) after review, usually within 72hours after test completion. If any changes need to be made, you will be notified at that same time.  Flu immunization administered today.    Medications reviewed and updated.  Changes include :   none  Your prescription(s) have been submitted to your pharmacy. Please take as directed and contact our office if you believe you are having problem(s) with the medication(s).   Please followup in 6 months

## 2017-12-05 NOTE — Assessment & Plan Note (Signed)
Diet controlled Check A1c Encourage regular exercise line follow-up in 6 months

## 2017-12-05 NOTE — Assessment & Plan Note (Signed)
When she does not go to the bathroom regularly she does get some left upper abdomen discomfort, but as long as she goes regularly she does not have this Takes Metamucil but not daily-encouraged her to start taking it on a daily basis

## 2017-12-21 ENCOUNTER — Other Ambulatory Visit: Payer: Self-pay | Admitting: Internal Medicine

## 2017-12-22 NOTE — Progress Notes (Deleted)
Subjective:    Patient ID: Susan Brady, female    DOB: 03/27/1945, 72 y.o.   MRN: [id number]  HPI She is here for an acute visit for cold symptoms.  Her symptoms started   She is experiencing  She has taken  Medications and allergies reviewed with patient and updated if appropriate.  Patient Active Problem List   Diagnosis Date Noted  . Rash and nonspecific skin eruption 07/16/2017  . Poor balance 12/20/2016  . Right flank pain 12/20/2016  . Acute left-sided low back pain without sciatica 12/03/2016  . Normal coronary arteries 08/07/2016  . Cardiomegaly 08/07/2016  . Angina at rest [facility]) 07/25/2016  . Abnormal stress test   . Chest pain of unknown etiology 07/23/2016  . Bradycardia 07/23/2016  . Dupuytren's contracture of right hand 07/19/2016  . Hyperlipidemia 06/25/2016  . Neck pain on right side 11/14/2015  . Poor sleep pattern 11/14/2015  . Constipation 11/14/2015  . Hiatal hernia, large 08/30/2015  . Gastric ulcer 08/30/2015  . Gastritis 08/15/2015  . Left shoulder pain 06/22/2015  . Essential hypertension 11/22/2014  . Non-insulin dependent type 2 diabetes mellitus (HCC) 11/22/2014  . Arthritis 11/22/2014  . Glaucoma 11/22/2014  . GERD (gastroesophageal reflux disease) 11/22/2014  . Varicose veins 11/22/2014  . Myocardial infarct, old 11/22/2014  . Superficial thrombophlebitis of lower extremity 11/22/2014    Current Outpatient Medications on File Prior to Visit  Medication Sig Dispense Refill  . acetaminophen (TYLENOL) 500 MG tablet Take 500-1,000 mg by mouth every 6 (six) hours as needed for mild pain.    Marland Kitchen aspirin EC 81 MG tablet Take 1 tablet (81 mg total) by mouth daily.    Marland Kitchen atorvastatin (LIPITOR) 20 MG tablet Take 1 tablet (20 mg total) by mouth daily. 90 tablet 0  . cholecalciferol (VITAMIN D) 1000 UNITS tablet Take 1,000 Units by mouth daily.     . furosemide (LASIX) 20 MG tablet TAKE 1 TABLET BY MOUTH TWICE DAILY 180 tablet 0  . Garlic 1000  MG CAPS Take 1,000 mg by mouth daily.    . hydrALAZINE (APRESOLINE) 50 MG tablet TAKE 1 AND 1/2 TABLET BY MOUTH THREE TIMES DAILY 135 tablet 0  . hydrochlorothiazide (HYDRODIURIL) 25 MG tablet TAKE 1 TABLET(25 MG) BY MOUTH DAILY 90 tablet 0  . metoprolol tartrate (LOPRESSOR) 25 MG tablet Take 1 tablet (25 mg total) by mouth daily.    . metoprolol tartrate (LOPRESSOR) 50 MG tablet TAKE 1 TABLET BY MOUTH TWICE DAILY. 180 tablet 0  . nystatin (NYSTATIN) powder APPLY TO ABDOMEN FOLDS AND UNDER BREAST 2-3 TIMES DAILY 60 g 5  . omega-3 acid ethyl esters (LOVAZA) 1 G capsule Take 1 g by mouth daily.     . pantoprazole (PROTONIX) 40 MG tablet Take 1 tablet (40 mg total) by mouth daily. 90 tablet 0  . potassium chloride SA (K-DUR,KLOR-CON) 20 MEQ tablet TAKE 1 TABLET BY MOUTH DAILY 90 tablet 0  . triamcinolone cream (KENALOG) 0.1 % Apply 1 application topically 2 (two) times daily. 30 g 0  . vitamin E 400 UNIT capsule Take 400 Units by mouth daily.     No current facility-administered medications on file prior to visit.     Past Medical History:  Diagnosis Date  . Anemia   . CHF (congestive heart failure) (HCC)   . Diabetes mellitus without complication (HCC)   . Gastric ulcer   . Glaucoma   . Hiatal hernia   . Hyperlipidemia   . Hypertension  Past Surgical History:  Procedure Laterality Date  . LEFT HEART CATH AND CORONARY ANGIOGRAPHY N/A 07/25/2016   Procedure: Left Heart Cath and Coronary Angiography;  Surgeon: Kathleene Hazel, MD;  Location: [facility] INVASIVE CV LAB;  Service: Cardiovascular;  Laterality: N/A;    Social History   Socioeconomic History  . Marital status: Divorced    Spouse name: Not on file  . Number of children: Not on file  . Years of education: Not on file  . Highest education level: Not on file  Occupational History  . Not on file  Social Needs  . Financial resource strain: Not on file  . Food insecurity:    Worry: Not on file    Inability: Not on file   . Transportation needs:    Medical: Not on file    Non-medical: Not on file  Tobacco Use  . Smoking status: Former Games developer  . Smokeless tobacco: Never Used  Substance and Sexual Activity  . Alcohol use: No    Alcohol/week: 0.0 standard drinks  . Drug use: No  . Sexual activity: Not on file  Lifestyle  . Physical activity:    Days per week: Not on file    Minutes per session: Not on file  . Stress: Not on file  Relationships  . Social connections:    Talks on phone: Not on file    Gets together: Not on file    Attends religious service: Not on file    Active member of club or organization: Not on file    Attends meetings of clubs or organizations: Not on file    Relationship status: Not on file  Other Topics Concern  . Not on file  Social History Narrative  . Not on file    Family History  Problem Relation Age of Onset  . Heart disease Mother   . Alcohol abuse Father   . Alcohol abuse Sister   . Diabetes Sister   . Hypertension Sister   . Cancer Sister   . CAD Sister        One sister with stents  . Alcohol abuse Brother   . Diabetes Brother   . Hypertension Brother     Review of Systems     Objective:  There were no vitals filed for this visit. There were no vitals filed for this visit. There is no height or weight on file to calculate BMI.  Wt Readings from Last 3 Encounters:  12/05/17 215 lb (97.5 kg)  07/16/17 212 lb (96.2 kg)  06/19/17 205 lb 8 oz (93.2 kg)     Physical Exam GENERAL APPEARANCE: Appears stated age, well appearing, NAD EYES: conjunctiva clear, no icterus HEENT: bilateral tympanic membranes and ear canals normal, oropharynx with mild erythema, no thyromegaly, trachea midline, no cervical or supraclavicular lymphadenopathy LUNGS: Clear to auscultation without wheeze or crackles, unlabored breathing, good air entry bilaterally CARDIOVASCULAR: Normal S1,S2 without murmurs, no edema SKIN: warm, dry        Assessment & Plan:   See  Problem List for Assessment and Plan of chronic medical problems.

## 2017-12-23 ENCOUNTER — Ambulatory Visit: Payer: Medicare Other | Admitting: Internal Medicine

## 2017-12-23 NOTE — Progress Notes (Signed)
Subjective:    Patient ID: Susan Brady, female    DOB: 02/13/1946, 72 y.o.   MRN: [id number]  HPI She is here for an acute visit for cold symptoms.  Her symptoms started 4 days ago.  She is experiencing subjective fevers and chills, nasal congestion, mild sore throat, tightness in her chest, productive cough, shortness of breath and mild wheezing.  She states body aches and lightheadedness.  She denies any ear pain, sinus pain, nausea, diarrhea and headaches.  1 of her Grandchildren were sick she was in close contact with him over the weekend.  She has taken prescription cough medication she had leftover.  Medications and allergies reviewed with patient and updated if appropriate.  Patient Active Problem List   Diagnosis Date Noted  . Rash and nonspecific skin eruption 07/16/2017  . Poor balance 12/20/2016  . Right flank pain 12/20/2016  . Acute left-sided low back pain without sciatica 12/03/2016  . Normal coronary arteries 08/07/2016  . Cardiomegaly 08/07/2016  . Angina at rest [facility]) 07/25/2016  . Abnormal stress test   . Chest pain of unknown etiology 07/23/2016  . Bradycardia 07/23/2016  . Dupuytren's contracture of right hand 07/19/2016  . Hyperlipidemia 06/25/2016  . Neck pain on right side 11/14/2015  . Poor sleep pattern 11/14/2015  . Constipation 11/14/2015  . Hiatal hernia, large 08/30/2015  . Gastric ulcer 08/30/2015  . Gastritis 08/15/2015  . Left shoulder pain 06/22/2015  . Essential hypertension 11/22/2014  . Non-insulin dependent type 2 diabetes mellitus (HCC) 11/22/2014  . Arthritis 11/22/2014  . Glaucoma 11/22/2014  . GERD (gastroesophageal reflux disease) 11/22/2014  . Varicose veins 11/22/2014  . Myocardial infarct, old 11/22/2014  . Superficial thrombophlebitis of lower extremity 11/22/2014    Current Outpatient Medications on File Prior to Visit  Medication Sig Dispense Refill  . acetaminophen (TYLENOL) 500 MG tablet Take 500-1,000 mg by  mouth every 6 (six) hours as needed for mild pain.    Marland Kitchen aspirin EC 81 MG tablet Take 1 tablet (81 mg total) by mouth daily.    Marland Kitchen atorvastatin (LIPITOR) 20 MG tablet Take 1 tablet (20 mg total) by mouth daily. 90 tablet 0  . cholecalciferol (VITAMIN D) 1000 UNITS tablet Take 1,000 Units by mouth daily.     . furosemide (LASIX) 20 MG tablet TAKE 1 TABLET BY MOUTH TWICE DAILY 180 tablet 0  . Garlic 1000 MG CAPS Take 1,000 mg by mouth daily.    . hydrALAZINE (APRESOLINE) 50 MG tablet TAKE 1 AND 1/2 TABLET BY MOUTH THREE TIMES DAILY 135 tablet 0  . hydrochlorothiazide (HYDRODIURIL) 25 MG tablet TAKE 1 TABLET(25 MG) BY MOUTH DAILY 90 tablet 0  . metoprolol tartrate (LOPRESSOR) 25 MG tablet Take 1 tablet (25 mg total) by mouth daily.    . metoprolol tartrate (LOPRESSOR) 50 MG tablet TAKE 1 TABLET BY MOUTH TWICE DAILY. 180 tablet 0  . nystatin (NYSTATIN) powder APPLY TO ABDOMEN FOLDS AND UNDER BREAST 2-3 TIMES DAILY 60 g 5  . omega-3 acid ethyl esters (LOVAZA) 1 G capsule Take 1 g by mouth daily.     . pantoprazole (PROTONIX) 40 MG tablet Take 1 tablet (40 mg total) by mouth daily. 90 tablet 0  . potassium chloride SA (K-DUR,KLOR-CON) 20 MEQ tablet TAKE 1 TABLET BY MOUTH DAILY 90 tablet 0  . triamcinolone cream (KENALOG) 0.1 % Apply 1 application topically 2 (two) times daily. 30 g 0  . vitamin E 400 UNIT capsule Take 400 Units by mouth  daily.     No current facility-administered medications on file prior to visit.     Past Medical History:  Diagnosis Date  . Anemia   . CHF (congestive heart failure) (HCC)   . Diabetes mellitus without complication (HCC)   . Gastric ulcer   . Glaucoma   . Hiatal hernia   . Hyperlipidemia   . Hypertension     Past Surgical History:  Procedure Laterality Date  . LEFT HEART CATH AND CORONARY ANGIOGRAPHY N/A 07/25/2016   Procedure: Left Heart Cath and Coronary Angiography;  Surgeon: Kathleene Hazel, MD;  Location: [facility] INVASIVE CV LAB;  Service:  Cardiovascular;  Laterality: N/A;    Social History   Socioeconomic History  . Marital status: Divorced    Spouse name: Not on file  . Number of children: Not on file  . Years of education: Not on file  . Highest education level: Not on file  Occupational History  . Not on file  Social Needs  . Financial resource strain: Not on file  . Food insecurity:    Worry: Not on file    Inability: Not on file  . Transportation needs:    Medical: Not on file    Non-medical: Not on file  Tobacco Use  . Smoking status: Former Games developer  . Smokeless tobacco: Never Used  Substance and Sexual Activity  . Alcohol use: No    Alcohol/week: 0.0 standard drinks  . Drug use: No  . Sexual activity: Not on file  Lifestyle  . Physical activity:    Days per week: Not on file    Minutes per session: Not on file  . Stress: Not on file  Relationships  . Social connections:    Talks on phone: Not on file    Gets together: Not on file    Attends religious service: Not on file    Active member of club or organization: Not on file    Attends meetings of clubs or organizations: Not on file    Relationship status: Not on file  Other Topics Concern  . Not on file  Social History Narrative  . Not on file    Family History  Problem Relation Age of Onset  . Heart disease Mother   . Alcohol abuse Father   . Alcohol abuse Sister   . Diabetes Sister   . Hypertension Sister   . Cancer Sister   . CAD Sister        One sister with stents  . Alcohol abuse Brother   . Diabetes Brother   . Hypertension Brother     Review of Systems  Constitutional: Positive for chills and fever.  HENT: Positive for congestion and sore throat (minimal). Negative for ear pain and sinus pain.   Respiratory: Positive for cough (productive), chest tightness, shortness of breath and wheezing.   Gastrointestinal: Negative for diarrhea and nausea.  Musculoskeletal: Positive for myalgias.  Neurological: Positive for  light-headedness. Negative for headaches.       Objective:   Vitals:   12/24/17 1121  BP: 138/80  Pulse: (!) 58  Resp: 16  Temp: 98.1 F (36.7 C)  SpO2: 97%   Filed Weights   12/24/17 1121  Weight: 215 lb 6.4 oz (97.7 kg)   Body mass index is 34.77 kg/m.  Wt Readings from Last 3 Encounters:  12/24/17 215 lb 6.4 oz (97.7 kg)  12/05/17 215 lb (97.5 kg)  07/16/17 212 lb (96.2 kg)     Physical  Exam GENERAL APPEARANCE: Appears stated age, well appearing, NAD EYES: conjunctiva clear, no icterus HEENT: bilateral tympanic membranes and ear canals normal, oropharynx with mild erythema, no thyromegaly, trachea midline, no cervical or supraclavicular lymphadenopathy LUNGS: Clear to auscultation without wheeze or crackles, unlabored breathing, good air entry bilaterally CARDIOVASCULAR: Normal S1,S2 without murmurs, no edema SKIN: warm, dry        Assessment & Plan:   See Problem List for Assessment and Plan of chronic medical problems.

## 2017-12-24 ENCOUNTER — Ambulatory Visit (INDEPENDENT_AMBULATORY_CARE_PROVIDER_SITE_OTHER): Payer: Medicare Other | Admitting: Internal Medicine

## 2017-12-24 ENCOUNTER — Encounter: Payer: Self-pay | Admitting: Internal Medicine

## 2017-12-24 VITALS — BP 138/80 | HR 58 | Temp 98.1°F | Resp 16 | Ht 66.0 in | Wt 215.4 lb

## 2017-12-24 DIAGNOSIS — J069 Acute upper respiratory infection, unspecified: Secondary | ICD-10-CM | POA: Insufficient documentation

## 2017-12-24 DIAGNOSIS — J399 Disease of upper respiratory tract, unspecified: Secondary | ICD-10-CM | POA: Insufficient documentation

## 2017-12-24 MED ORDER — HYDROCODONE-HOMATROPINE 5-1.5 MG/5ML PO SYRP: 5.0000 mL | ORAL_SOLUTION | Freq: Three times a day (TID) | ORAL | 0 refills | Status: DC | PRN

## 2017-12-24 MED ORDER — AMOXICILLIN 500 MG PO CAPS: 500.0000 mg | ORAL_CAPSULE | Freq: Three times a day (TID) | ORAL | 0 refills | Status: DC

## 2017-12-24 NOTE — Assessment & Plan Note (Signed)
Concern for bacterial infection-was in contact with her grandchild had similar symptoms Having productive cough, subjective fevers and chills, shortness of breath and wheezing and chest tightness Low suspicion for pneumonia We will start amoxicillin 3 times daily Hycodan cough syrup as needed Over-the-counter cold medications as needed Rest, fluids Call if no improvement

## 2017-12-24 NOTE — Patient Instructions (Signed)
Take the antibiotic as prescribed - complete the entire course.  Use the cough syrup as needed.  Continue over the counter cold medication, advil and tylenol.  Increase your fluids and rest.    Call if no improvement    

## 2018-01-16 ENCOUNTER — Other Ambulatory Visit: Payer: Self-pay | Admitting: Internal Medicine

## 2018-02-17 ENCOUNTER — Other Ambulatory Visit: Payer: Self-pay | Admitting: Internal Medicine

## 2018-02-19 ENCOUNTER — Other Ambulatory Visit: Payer: Self-pay | Admitting: Internal Medicine

## 2018-02-19 ENCOUNTER — Telehealth: Payer: Self-pay | Admitting: Internal Medicine

## 2018-02-19 NOTE — Telephone Encounter (Signed)
Copied from CRM [id number]. Topic: Quick Communication - See Telephone Encounter >> Feb 19, 2018  6:37 PM Jens Som A wrote: CRM for notification. See Telephone encounter for: 02/19/18.  Patient called in to see if a medication can be written for a cough. Cough is keeping her up at night. No appts were available for tomorrow to schedule the patient Please advise [id number] - [place], [place] - [address] AT [address] [phone] (Phone) [phone] (Fax)

## 2018-02-20 ENCOUNTER — Ambulatory Visit: Payer: Self-pay | Admitting: *Deleted

## 2018-02-20 NOTE — Telephone Encounter (Signed)
Daughter states pt has dry cough, worst at night.  Has been taking the Hycodan syrup, but it is not helping much. She sleeps with her head elevated. Recommending using a humidifier, taking Coricidin and Delsym to help with her cough. Try taking alternating medications to see which one is working best for her. Try cough drops with honey and even taking a tablespoon of honey at bedtime. Drinking hot tea with honey and lemon. Daughter voiced understanding and will try these. Will call back if not working and schedule an appointment with her provider. Routing to flow at [facility]

## 2018-02-20 NOTE — Telephone Encounter (Signed)
Attempted to call patient to triage her cough and see how we could help her- Left message for patient to call back.

## 2018-03-21 ENCOUNTER — Other Ambulatory Visit: Payer: Self-pay | Admitting: Internal Medicine

## 2018-03-24 ENCOUNTER — Other Ambulatory Visit: Payer: Self-pay | Admitting: Internal Medicine

## 2018-03-24 DIAGNOSIS — Z1231 Encounter for screening mammogram for malignant neoplasm of breast: Secondary | ICD-10-CM

## 2018-03-26 ENCOUNTER — Telehealth: Payer: Self-pay

## 2018-03-26 NOTE — Telephone Encounter (Signed)
Called Tori back. LVM for her to resend form or paper work to [phone]. Never received it.

## 2018-03-26 NOTE — Telephone Encounter (Signed)
Copied from CRM [id number]. Topic: General - Other >> Mar 25, 2018  1:02 PM Marylen Ponto wrote: Reason for CRM: Tori with [employer] called in to check the status of the fax that was sent on 03/23/18. Tori asked that the paperwork be completed and returned to the fax # listed on form. Cb# [phone] Ext. [phone]

## 2018-04-02 NOTE — Telephone Encounter (Signed)
Susan Brady with [employer] called in reference to fax that was sent 03/26/2018 and prior to that would like to receive paper work back completed ASAP and a call back when fax is received.  Ph# [phone]  Ext. [phone]

## 2018-04-02 NOTE — Telephone Encounter (Signed)
Left another VM letting Tori know this paperwork has not been received. I have not seen any paper work for this patient. Gave new fax number and direct fax number on VM. I will keep a lookout for form.

## 2018-04-14 ENCOUNTER — Encounter: Payer: Self-pay | Admitting: Internal Medicine

## 2018-04-14 DIAGNOSIS — I5032 Chronic diastolic (congestive) heart failure: Secondary | ICD-10-CM | POA: Insufficient documentation

## 2018-04-22 ENCOUNTER — Other Ambulatory Visit: Payer: Self-pay | Admitting: Internal Medicine

## 2018-04-23 ENCOUNTER — Ambulatory Visit: Payer: Self-pay

## 2018-04-23 ENCOUNTER — Ambulatory Visit: Payer: Medicare Other

## 2018-04-23 NOTE — Progress Notes (Signed)
Subjective:    Patient ID: Susan Brady, female    DOB: 13-Sep-1945, 73 y.o.   MRN: [id number]  HPI The patient is here for an acute visit.   Lightheadedness:  It started about 6 days ago.  It is intermittent.  She notices it when she stands up after sitting.   She sometimes has some lightheadedness when walking around.  Sitting down helps.  She drinks a good amount of fluids.  She denies changes in medication or lifestyle.     Constipation:  She has to take a laxative to have a BM.  She takes metamucil daily.  She has some discomfort when she is constipated.     Medications and allergies reviewed with patient and updated if appropriate.  Patient Active Problem List   Diagnosis Date Noted  . Chronic diastolic heart failure (HCC) 04/14/2018  . Upper respiratory tract infection 12/24/2017  . Rash and nonspecific skin eruption 07/16/2017  . Poor balance 12/20/2016  . Right flank pain 12/20/2016  . Acute left-sided low back pain without sciatica 12/03/2016  . Normal coronary arteries 08/07/2016  . Cardiomegaly 08/07/2016  . Angina at rest [facility]) 07/25/2016  . Abnormal stress test   . Chest pain of unknown etiology 07/23/2016  . Bradycardia 07/23/2016  . Dupuytren's contracture of right hand 07/19/2016  . Hyperlipidemia 06/25/2016  . Neck pain on right side 11/14/2015  . Poor sleep pattern 11/14/2015  . Constipation 11/14/2015  . Hiatal hernia, large 08/30/2015  . Gastric ulcer 08/30/2015  . Gastritis 08/15/2015  . Left shoulder pain 06/22/2015  . Essential hypertension 11/22/2014  . Non-insulin dependent type 2 diabetes mellitus (HCC) 11/22/2014  . Arthritis 11/22/2014  . Glaucoma 11/22/2014  . GERD (gastroesophageal reflux disease) 11/22/2014  . Varicose veins 11/22/2014  . Myocardial infarct, old 11/22/2014  . Superficial thrombophlebitis of lower extremity 11/22/2014    Current Outpatient Medications on File Prior to Visit  Medication Sig Dispense Refill  .  acetaminophen (TYLENOL) 500 MG tablet Take 500-1,000 mg by mouth every 6 (six) hours as needed for mild pain.    Marland Kitchen aspirin EC 81 MG tablet Take 1 tablet (81 mg total) by mouth daily.    Marland Kitchen atorvastatin (LIPITOR) 20 MG tablet Take 1 tablet (20 mg total) by mouth daily. -- Office visit needed for further refills 90 tablet 0  . cholecalciferol (VITAMIN D) 1000 UNITS tablet Take 1,000 Units by mouth daily.     . furosemide (LASIX) 20 MG tablet TAKE 1 TABLET BY MOUTH TWICE DAILY 180 tablet 0  . Garlic 1000 MG CAPS Take 1,000 mg by mouth daily.    . hydrALAZINE (APRESOLINE) 50 MG tablet TAKE 1 AND 1/2 TABLETS BY MOUTH THREE TIMES DAILY 135 tablet 0  . hydrochlorothiazide (HYDRODIURIL) 25 MG tablet TAKE 1 TABLET BY MOUTH DAILY. 90 tablet 0  . metoprolol tartrate (LOPRESSOR) 50 MG tablet TAKE 1 TABLET BY MOUTH TWICE DAILY 180 tablet 0  . nystatin (NYSTATIN) powder APPLY TO ABDOMEN FOLDS AND UNDER BREAST 2-3 TIMES DAILY 60 g 5  . omega-3 acid ethyl esters (LOVAZA) 1 G capsule Take 1 g by mouth daily.     . pantoprazole (PROTONIX) 40 MG tablet TAKE 1 TABLET(40 MG) BY MOUTH DAILY 90 tablet 0  . potassium chloride SA (K-DUR,KLOR-CON) 20 MEQ tablet TAKE 1 TABLET BY MOUTH DAILY 90 tablet 0  . triamcinolone cream (KENALOG) 0.1 % Apply 1 application topically 2 (two) times daily. 30 g 0  . vitamin E 400  UNIT capsule Take 400 Units by mouth daily.     No current facility-administered medications on file prior to visit.     Past Medical History:  Diagnosis Date  . Anemia   . CHF (congestive heart failure) (HCC)   . Diabetes mellitus without complication (HCC)   . Gastric ulcer   . Glaucoma   . Hiatal hernia   . Hyperlipidemia   . Hypertension     Past Surgical History:  Procedure Laterality Date  . LEFT HEART CATH AND CORONARY ANGIOGRAPHY N/A 07/25/2016   Procedure: Left Heart Cath and Coronary Angiography;  Surgeon: Kathleene Hazel, MD;  Location: [facility] INVASIVE CV LAB;  Service: Cardiovascular;   Laterality: N/A;    Social History   Socioeconomic History  . Marital status: Divorced    Spouse name: Not on file  . Number of children: Not on file  . Years of education: Not on file  . Highest education level: Not on file  Occupational History  . Not on file  Social Needs  . Financial resource strain: Not on file  . Food insecurity:    Worry: Not on file    Inability: Not on file  . Transportation needs:    Medical: Not on file    Non-medical: Not on file  Tobacco Use  . Smoking status: Former Games developer  . Smokeless tobacco: Never Used  Substance and Sexual Activity  . Alcohol use: No    Alcohol/week: 0.0 standard drinks  . Drug use: No  . Sexual activity: Not on file  Lifestyle  . Physical activity:    Days per week: Not on file    Minutes per session: Not on file  . Stress: Not on file  Relationships  . Social connections:    Talks on phone: Not on file    Gets together: Not on file    Attends religious service: Not on file    Active member of club or organization: Not on file    Attends meetings of clubs or organizations: Not on file    Relationship status: Not on file  Other Topics Concern  . Not on file  Social History Narrative  . Not on file    Family History  Problem Relation Age of Onset  . Heart disease Mother   . Alcohol abuse Father   . Alcohol abuse Sister   . Diabetes Sister   . Hypertension Sister   . Cancer Sister   . CAD Sister        One sister with stents  . Alcohol abuse Brother   . Diabetes Brother   . Hypertension Brother     Review of Systems  Constitutional: Negative for fever.  HENT: Positive for postnasal drip.   Eyes: Negative for visual disturbance.  Respiratory: Positive for cough (at night only), shortness of breath (chronic, with exertion, worse in the past month) and wheezing (at night only).   Cardiovascular: Positive for palpitations (occasional, transient) and leg swelling (left ankle - chronic). Negative for chest  pain.  Gastrointestinal: Positive for constipation. Negative for blood in stool.       No gerd  Neurological: Positive for light-headedness. Negative for weakness, numbness and headaches.       Objective:   Vitals:   04/24/18 1025  BP: 116/68  Pulse: 60  Resp: 16  Temp: 98.8 F (37.1 C)  SpO2: 96%   BP Readings from Last 3 Encounters:  04/24/18 116/68  12/24/17 138/80  12/05/17 122/64  Wt Readings from Last 3 Encounters:  04/24/18 214 lb (97.1 kg)  12/24/17 215 lb 6.4 oz (97.7 kg)  12/05/17 215 lb (97.5 kg)   Body mass index is 34.54 kg/m.   Physical Exam    Constitutional: Appears well-developed and well-nourished. No distress.  HENT:  Head: Normocephalic and atraumatic.  Neck: Neck supple. No tracheal deviation present. No thyromegaly present.  No cervical lymphadenopathy Cardiovascular: Normal rate, regular rhythm and normal heart sounds.   No murmur heard. No carotid bruit .  No edema Pulmonary/Chest: Effort normal and breath sounds normal. No respiratory distress. No has no wheezes. No rales.  Neurological:  CN II-XII intact, gait normal, normal strength  Skin: Skin is warm and dry. Not diaphoretic.  Psychiatric: Normal mood and affect. Behavior is normal.       Assessment & Plan:    See Problem List for Assessment and Plan of chronic medical problems.

## 2018-04-23 NOTE — Telephone Encounter (Signed)
Incoming  Call from  Patient  And  Patients  Daughter, reporting that  Patient  Has  Been  Dizzy since Tuesday of  This  Week.  Experiences it  When  She  Gets  Up,  Feels  Woozy and  Room is  Tilting.   Rates  It  Moderate.  Change in  Head  Position aggravates it.  Heart rate  Was 62.  Other  Sx are  Coughing at  Night.  Reviewed protocol with  Patient.  Patient  Scheduled appointment for  Friday @  1000am.  With  Cheryll Cockayne MD.    Patient  To  Arrive  At  0945am.  Voiced  Understanding.    Reason for Disposition . [1] MODERATE dizziness (e.g., interferes with normal activities) AND [2] has been evaluated by physician for this  Answer Assessment - Initial Assessment Questions 1. DESCRIPTION: "Describe your dizziness."     Get up   2. LIGHTHEADED: "Do you feel lightheaded?" (e.g., somewhat faint, woozy, weak upon standing)     woozy 3. VERTIGO: "Do you feel like either you or the room is spinning or tilting?" (i.e. vertigo)     tilting 4. SEVERITY: "How bad is it?"  "Do you feel like you are going to faint?" "Can you stand and walk?"   - MILD - walking normally   - MODERATE - interferes with normal activities (e.g., work, school)    - SEVERE - unable to stand, requires support to walk, feels like passing out now.      moderate 5. ONSET:  "When did the dizziness begin?"     Tuesday 6. AGGRAVATING FACTORS: "Does anything make it worse?" (e.g., standing, change in head position)      Head position 7. HEART RATE: "Can you tell me your heart rate?" "How many beats in 15 seconds?"  (Note: not all patients can do this)       62 8. 9. RECURRENT SYMPTOM: "Have you had dizziness before?" If so, ask: "When was the last time?" "What happened that time?"     *No Answer* 10. OTHER SYMPTOMS: "Do you have any other symptoms?" (e.g., fever, chest pain, vomiting, diarrhea, bleeding)       Coughing at  Night  11. PREGNANCY: "Is there any chance you are pregnant?" "When was your last menstrual period?"      na  Protocols used: DIZZINESS [facility]

## 2018-04-24 ENCOUNTER — Ambulatory Visit (INDEPENDENT_AMBULATORY_CARE_PROVIDER_SITE_OTHER): Payer: Medicare Other | Admitting: Internal Medicine

## 2018-04-24 ENCOUNTER — Other Ambulatory Visit (INDEPENDENT_AMBULATORY_CARE_PROVIDER_SITE_OTHER): Payer: Medicare Other

## 2018-04-24 ENCOUNTER — Encounter: Payer: Self-pay | Admitting: Internal Medicine

## 2018-04-24 VITALS — BP 116/68 | HR 60 | Temp 98.8°F | Resp 16 | Ht 66.0 in | Wt 214.0 lb

## 2018-04-24 DIAGNOSIS — R42 Dizziness and giddiness: Secondary | ICD-10-CM | POA: Insufficient documentation

## 2018-04-24 DIAGNOSIS — K5900 Constipation, unspecified: Secondary | ICD-10-CM

## 2018-04-24 DIAGNOSIS — I10 Essential (primary) hypertension: Secondary | ICD-10-CM

## 2018-04-24 LAB — COMPREHENSIVE METABOLIC PANEL
ALT: 9 U/L (ref 0–35)
AST: 15 U/L (ref 0–37)
Albumin: 4.6 g/dL (ref 3.5–5.2)
Alkaline Phosphatase: 57 U/L (ref 39–117)
BUN: 12 mg/dL (ref 6–23)
CO2: 32 mEq/L (ref 19–32)
Calcium: 9.9 mg/dL (ref 8.4–10.5)
Chloride: 100 mEq/L (ref 96–112)
Creatinine, Ser: 0.77 mg/dL (ref 0.40–1.20)
GFR: 88.78 mL/min (ref >60.00)
Glucose, Bld: 102 mg/dL — ABNORMAL HIGH (ref 70–99)
Potassium: 3.8 mEq/L (ref 3.5–5.1)
Sodium: 142 mEq/L (ref 135–145)
Total Bilirubin: 0.5 mg/dL (ref 0.2–1.2)
Total Protein: 7.6 g/dL (ref 6.0–8.3)

## 2018-04-24 LAB — CBC WITH DIFFERENTIAL/PLATELET
Basophils Absolute: 0 10*3/uL (ref 0.0–0.1)
Basophils Relative: 0.7 % (ref 0.0–3.0)
EOS PCT: 2.2 % (ref 0.0–5.0)
Eosinophils Absolute: 0.1 10*3/uL (ref 0.0–0.7)
HCT: 38.8 % (ref 36.0–46.0)
Hemoglobin: 12.7 g/dL (ref 12.0–15.0)
Lymphocytes Relative: 40.1 % (ref 12.0–46.0)
Lymphs Abs: 2.7 10*3/uL (ref 0.7–4.0)
MCHC: 32.7 g/dL (ref 30.0–36.0)
MCV: 88.2 fl (ref 78.0–100.0)
MONO ABS: 0.9 10*3/uL (ref 0.1–1.0)
Monocytes Relative: 13 % — ABNORMAL HIGH (ref 3.0–12.0)
Neutro Abs: 2.9 10*3/uL (ref 1.4–7.7)
Neutrophils Relative %: 44 % (ref 43.0–77.0)
Platelets: 378 10*3/uL (ref 150.0–400.0)
RBC: 4.4 Mil/uL (ref 3.87–5.11)
RDW: 13.5 % (ref 11.5–15.5)
WBC: 6.6 10*3/uL (ref 4.0–10.5)

## 2018-04-24 NOTE — Assessment & Plan Note (Signed)
BP is very good but may be too low for her - her lightheadedness is possibly orthostatic in nature She is on two diuretics - stop hctz Continue other medications Cmp, cbc today Call if no improvement 

## 2018-04-24 NOTE — Patient Instructions (Addendum)
For your constipation: Continue metamucil daily.   Start colace 1-3 pills daily (stool softener).    Stop the hydrochlorothiazide.  Continue all your other medications.  If your lightheadedness does not improve with stopping the medication please let me know.      Have blood work done today. We will call you with the results.

## 2018-04-24 NOTE — Assessment & Plan Note (Signed)
BP is very good but may be too low for her - her lightheadedness is possibly orthostatic in nature She is on two diuretics - stop hctz Continue other medications Cmp, cbc today Call if no improvement

## 2018-04-24 NOTE — Assessment & Plan Note (Signed)
Chronic Taking metamucil daily Start colace 100-300 mg daily - titrate as needed Continue increased water

## 2018-05-20 ENCOUNTER — Other Ambulatory Visit: Payer: Self-pay | Admitting: Internal Medicine

## 2018-05-28 ENCOUNTER — Other Ambulatory Visit: Payer: Self-pay | Admitting: Internal Medicine

## 2018-06-19 ENCOUNTER — Other Ambulatory Visit: Payer: Self-pay | Admitting: Internal Medicine

## 2018-06-19 NOTE — Telephone Encounter (Signed)
LVM for pt to call back to schedule a virtual follow with Dr. Lawerance Bach. Overdue

## 2018-06-24 ENCOUNTER — Other Ambulatory Visit: Payer: Self-pay | Admitting: Internal Medicine

## 2018-06-29 ENCOUNTER — Other Ambulatory Visit: Payer: Self-pay | Admitting: Internal Medicine

## 2018-07-02 NOTE — Progress Notes (Signed)
Virtual Visit via Video Note  I connected with Susan Brady on 07/03/18 at  1:15 PM EDT by a video enabled telemedicine application and verified that I am speaking with the correct person using two identifiers.   I discussed the limitations of evaluation and management by telemedicine and the availability of in person appointments. The patient expressed understanding and agreed to proceed.  The patient is currently at home and I am in the office.    No referring provider.    History of Present Illness: She is here for follow up of her chronic medical conditions.   She is not exercising regularly.    Hypertension: She is taking her medication daily. She is compliant with a low sodium diet.  She denies chest pain, palpitations, edema, shortness of breath and regular headaches.     Hyperlipidemia: She is taking her medication daily. She is compliant with a low fat/cholesterol diet. She denies myalgias.   Diabetes: She is controlling her sugars with diet. She is compliant with a diabetic diet.  She is up-to-date with an ophthalmology examination.   GERD:  She is taking her medication daily as prescribed.  She denies any GERD symptoms and feels her GERD is well controlled.   Review of Systems  Constitutional: Negative for chills and fever.  Respiratory: Negative for cough and shortness of breath.   Cardiovascular: Negative for chest pain, palpitations and leg swelling.  Neurological: Positive for headaches (occasional).     Social History   Socioeconomic History  . Marital status: Divorced    Spouse name: Not on file  . Number of children: Not on file  . Years of education: Not on file  . Highest education level: Not on file  Occupational History  . Not on file  Social Needs  . Financial resource strain: Not on file  . Food insecurity:    Worry: Not on file    Inability: Not on file  . Transportation needs:    Medical: Not on file    Non-medical: Not on file  Tobacco Use   . Smoking status: Former Games developer  . Smokeless tobacco: Never Used  Substance and Sexual Activity  . Alcohol use: No    Alcohol/week: 0.0 standard drinks  . Drug use: No  . Sexual activity: Not on file  Lifestyle  . Physical activity:    Days per week: Not on file    Minutes per session: Not on file  . Stress: Not on file  Relationships  . Social connections:    Talks on phone: Not on file    Gets together: Not on file    Attends religious service: Not on file    Active member of club or organization: Not on file    Attends meetings of clubs or organizations: Not on file    Relationship status: Not on file  Other Topics Concern  . Not on file  Social History Narrative  . Not on file     Observations/Objective: Appears well in NAD Normal mood and affect   BP Readings from Last 3 Encounters:  04/24/18 116/68  12/24/17 138/80  12/05/17 122/64   Her daughter states her blood pressure has been controlled at home.  Assessment and Plan:  See Problem List for Assessment and Plan of chronic medical problems.   Follow Up Instructions:    I discussed the assessment and treatment plan with the patient. The patient was provided an opportunity to ask questions and all were answered. The patient  agreed with the plan and demonstrated an understanding of the instructions.   The patient was advised to call back or seek an in-person evaluation if the symptoms worsen or if the condition fails to improve as anticipated.  FU in 6 months-she is very stable so I will not have her come back before 6 months as long as it is safe for her to return to the office at that time.  We will hold off and do blood work at that time, but will do it sooner if needed  Pincus SanesStacy J Xela Oregel, MD

## 2018-07-03 ENCOUNTER — Ambulatory Visit (INDEPENDENT_AMBULATORY_CARE_PROVIDER_SITE_OTHER): Payer: Medicare Other | Admitting: Internal Medicine

## 2018-07-03 ENCOUNTER — Encounter: Payer: Self-pay | Admitting: Internal Medicine

## 2018-07-03 ENCOUNTER — Other Ambulatory Visit: Payer: Self-pay | Admitting: Internal Medicine

## 2018-07-03 DIAGNOSIS — E7849 Other hyperlipidemia: Secondary | ICD-10-CM | POA: Diagnosis not present

## 2018-07-03 DIAGNOSIS — I10 Essential (primary) hypertension: Secondary | ICD-10-CM

## 2018-07-03 DIAGNOSIS — E119 Type 2 diabetes mellitus without complications: Secondary | ICD-10-CM | POA: Diagnosis not present

## 2018-07-03 DIAGNOSIS — K219 Gastro-esophageal reflux disease without esophagitis: Secondary | ICD-10-CM | POA: Diagnosis not present

## 2018-07-03 MED ORDER — METOPROLOL TARTRATE 50 MG PO TABS: ORAL_TABLET | ORAL | 1 refills | Status: DC

## 2018-07-03 MED ORDER — HYDRALAZINE HCL 50 MG PO TABS: ORAL_TABLET | ORAL | 1 refills | Status: DC

## 2018-07-03 MED ORDER — PANTOPRAZOLE SODIUM 40 MG PO TBEC: DELAYED_RELEASE_TABLET | ORAL | 1 refills | Status: DC

## 2018-07-03 MED ORDER — POTASSIUM CHLORIDE CRYS ER 20 MEQ PO TBCR: 20.0000 meq | EXTENDED_RELEASE_TABLET | Freq: Every day | ORAL | 1 refills | Status: DC

## 2018-07-03 MED ORDER — ATORVASTATIN CALCIUM 20 MG PO TABS: ORAL_TABLET | ORAL | 1 refills | Status: DC

## 2018-07-03 MED ORDER — FUROSEMIDE 20 MG PO TABS: 20.0000 mg | ORAL_TABLET | Freq: Two times a day (BID) | ORAL | 1 refills | Status: DC

## 2018-07-03 NOTE — Assessment & Plan Note (Signed)
BP Readings from Last 3 Encounters:  04/24/18 116/68  12/24/17 138/80  12/05/17 122/64   Blood pressure has been well controlled in the past and she is checking her blood pressure at home and it is well controlled at home Continue current medications We will hold off on checking blood work for 6 months when hopefully she will be back in the office for a visit

## 2018-07-03 NOTE — Assessment & Plan Note (Signed)
Diet controlled A1c has remained stable so we will hold off on having her come in for blood work until she comes back to the office in 6 months

## 2018-07-03 NOTE — Assessment & Plan Note (Signed)
Continue atorvastatin

## 2018-07-03 NOTE — Assessment & Plan Note (Signed)
GERD controlled Continue daily medication  

## 2018-07-29 ENCOUNTER — Telehealth: Payer: Self-pay | Admitting: Internal Medicine

## 2018-07-29 MED ORDER — CLOTRIMAZOLE-BETAMETHASONE 1-0.05 % EX CREA: 1.0000 "application " | TOPICAL_CREAM | Freq: Two times a day (BID) | CUTANEOUS | 0 refills | Status: DC

## 2018-07-29 NOTE — Telephone Encounter (Signed)
Copied from [place] [id number]. Topic: Quick Communication - Rx Refill/Question >> Jul 29, 2018 10:39 AM Alanda Slim E wrote: Medication: Diflucan - Pt is experiencing vaginal itching. Daughter states their is no redness, bumps or discharge and it seems to be on the panty line. Daughter wants to know if Dr. Quay Burow can prescribe Diflucan or any other suggestions/ please advise

## 2018-07-29 NOTE — Telephone Encounter (Signed)
On the edge of the panties.

## 2018-07-29 NOTE — Telephone Encounter (Signed)
Lets try a cream since it seems very localized.  If there is no improvement then she may need to do a virtual visit.  Prescription sent to pharmacy.

## 2018-07-29 NOTE — Telephone Encounter (Signed)
Do you want appointment?

## 2018-07-29 NOTE — Telephone Encounter (Signed)
Is the itching outside the vagina or along the edge of the panties?

## 2018-07-29 NOTE — Telephone Encounter (Signed)
Pts daughter aware of response.  

## 2018-08-16 ENCOUNTER — Other Ambulatory Visit: Payer: Self-pay | Admitting: Internal Medicine

## 2018-08-19 ENCOUNTER — Other Ambulatory Visit: Payer: Self-pay

## 2018-09-17 ENCOUNTER — Other Ambulatory Visit: Payer: Self-pay | Admitting: Internal Medicine

## 2018-09-21 NOTE — Progress Notes (Signed)
Virtual Visit via Video Note  I connected with Susan Brady on 09/22/18 at  9:30 AM EDT by a video enabled telemedicine application and verified that I am speaking with the correct person using two identifiers.   I discussed the limitations of evaluation and management by telemedicine and the availability of in person appointments. The patient expressed understanding and agreed to proceed.  The patient is currently at home and I am in the office.  Her daughter was at home with her and helps provide the history.  No referring provider.    History of Present Illness: This is an acute visit for lightheadedness.  She has been experiencing lightheadedness/dizziness for 4 days.  Dizziness is intermittent and does not seem to be worse with head movements.  It seems to be better when she is sitting in the recliner.  She is eating and drinking normally and her blood pressure and sugars at home have been very good.  She has had some nausea, but her daughter is not sure if it is associated with the lightheadedness/dizziness or separate.  At times when she is lightheaded she will also be nauseous.  She has not had any vomiting or diarrhea.  She did have an episode similar to this when she was on the hydrochlorothiazide and once that was discontinued 4 months ago the symptoms stopped.  She denies other episodes of lightheadedness/dizziness.  Her daughter states some intermittent, sharp pain on her left side of her chest near her bra strap.  This started about a week ago and is transient-it comes and goes very quickly.  She also tires easily, which is not new.  She has been unsteady, but that is not new and has been ongoing for a while.  She has not seen cardiology in a while.     Review of Systems  Constitutional: Negative for chills and fever.  HENT: Negative for congestion, sinus pain and sore throat.   Respiratory: Positive for shortness of breath. Negative for cough and wheezing.   Cardiovascular:  Positive for palpitations. Negative for chest pain and leg swelling.  Gastrointestinal: Positive for nausea. Negative for diarrhea, heartburn and vomiting.  Neurological: Positive for dizziness and headaches (more frequent). Negative for weakness.       Pins feeling top of feet.  Gait is unsteady-this is chronic and no recent change     Social History   Socioeconomic History  . Marital status: Divorced    Spouse name: Not on file  . Number of children: Not on file  . Years of education: Not on file  . Highest education level: Not on file  Occupational History  . Not on file  Social Needs  . Financial resource strain: Not on file  . Food insecurity    Worry: Not on file    Inability: Not on file  . Transportation needs    Medical: Not on file    Non-medical: Not on file  Tobacco Use  . Smoking status: Former Research scientist (life sciences)  . Smokeless tobacco: Never Used  Substance and Sexual Activity  . Alcohol use: No    Alcohol/week: 0.0 standard drinks  . Drug use: No  . Sexual activity: Not on file  Lifestyle  . Physical activity    Days per week: Not on file    Minutes per session: Not on file  . Stress: Not on file  Relationships  . Social Herbalist on phone: Not on file    Gets together: Not on  file    Attends religious service: Not on file    Active member of club or organization: Not on file    Attends meetings of clubs or organizations: Not on file    Relationship status: Not on file  Other Topics Concern  . Not on file  Social History Narrative  . Not on file     Observations/Objective: Appears well in NAD   Assessment and Plan:  See Problem List for Assessment and Plan of chronic medical problems.   Follow Up Instructions:    I discussed the assessment and treatment plan with the patient. The patient was provided an opportunity to ask questions and all were answered. The patient agreed with the plan and demonstrated an understanding of the  instructions.   The patient was advised to call back or seek an in-person evaluation if the symptoms worsen or if the condition fails to improve as anticipated.  Advised to follow-up with cardiology-she has not seen them in a while and some of her tiring easily could possibly be cardiac in nature or deconditioning.  Dizziness could also be cardiac so if there is no improvement she will need to see cardiology sooner.  Continue to monitor blood pressure and sugars at home.  Her daughter will call if there is no improvement in her symptoms  Pincus SanesStacy J Shaquetta Arcos, MD

## 2018-09-22 ENCOUNTER — Encounter: Payer: Self-pay | Admitting: Internal Medicine

## 2018-09-22 ENCOUNTER — Ambulatory Visit (INDEPENDENT_AMBULATORY_CARE_PROVIDER_SITE_OTHER): Payer: Medicare Other | Admitting: Internal Medicine

## 2018-09-22 DIAGNOSIS — R42 Dizziness and giddiness: Secondary | ICD-10-CM

## 2018-09-22 DIAGNOSIS — R110 Nausea: Secondary | ICD-10-CM | POA: Insufficient documentation

## 2018-09-22 MED ORDER — MECLIZINE HCL 12.5 MG PO TABS: 12.5000 mg | ORAL_TABLET | Freq: Three times a day (TID) | ORAL | 0 refills | Status: DC | PRN

## 2018-09-22 MED ORDER — ONDANSETRON HCL 4 MG PO TABS: 4.0000 mg | ORAL_TABLET | Freq: Three times a day (TID) | ORAL | 0 refills | Status: DC | PRN

## 2018-09-22 NOTE — Assessment & Plan Note (Signed)
Experiencing dizziness/lightheadedness for the past 4 days Intermittent ?  BPPV or labyrinthitis Discussed that a stroke is a possibility, but she would require imaging Discussed options at this point-trial of medication for she would need to come in to be evaluated-due to COVID we will try to avoid having her come in unless her symptoms do not improve Trial of meclizine 12.5 mg 3 times daily as needed Zofran as needed for nausea Call if symptoms do not improve

## 2018-09-22 NOTE — Assessment & Plan Note (Signed)
Experiencing intermittent nausea-some of which is related to dizziness and some seems to be independent Hopefully treating dizziness this will improve Can try Zofran as needed She is eating and drinking normally If no improvement her daughter will let me know we may need to do blood work and further evaluation

## 2018-11-18 ENCOUNTER — Ambulatory Visit
Admission: RE | Admit: 2018-11-18 | Discharge: 2018-11-18 | Disposition: A | Discharge status: Home / Self Care | Payer: Medicare Other | Source: Ambulatory Visit | Attending: Internal Medicine | Admitting: Internal Medicine

## 2018-11-18 ENCOUNTER — Other Ambulatory Visit: Payer: Self-pay

## 2018-11-18 DIAGNOSIS — Z1231 Encounter for screening mammogram for malignant neoplasm of breast: Secondary | ICD-10-CM | POA: Diagnosis not present

## 2018-11-19 ENCOUNTER — Other Ambulatory Visit: Payer: Self-pay

## 2018-11-19 ENCOUNTER — Ambulatory Visit (INDEPENDENT_AMBULATORY_CARE_PROVIDER_SITE_OTHER): Payer: Medicare Other

## 2018-11-19 ENCOUNTER — Ambulatory Visit (INDEPENDENT_AMBULATORY_CARE_PROVIDER_SITE_OTHER): Payer: Medicare Other | Admitting: Podiatry

## 2018-11-19 ENCOUNTER — Encounter: Payer: Self-pay | Admitting: Podiatry

## 2018-11-19 VITALS — BP 149/74 | HR 68 | Resp 16

## 2018-11-19 DIAGNOSIS — M778 Other enthesopathies, not elsewhere classified: Secondary | ICD-10-CM

## 2018-11-19 DIAGNOSIS — E1142 Type 2 diabetes mellitus with diabetic polyneuropathy: Secondary | ICD-10-CM

## 2018-11-19 DIAGNOSIS — B351 Tinea unguium: Secondary | ICD-10-CM

## 2018-11-19 DIAGNOSIS — M79676 Pain in unspecified toe(s): Secondary | ICD-10-CM | POA: Diagnosis not present

## 2018-11-21 ENCOUNTER — Encounter: Payer: Self-pay | Admitting: Podiatry

## 2018-11-21 NOTE — Progress Notes (Signed)
Subjective:  Patient ID: Susan Brady, female    DOB: 07-Apr-1945,  MRN: [id number] HPI Chief Complaint  Patient presents with  . Nail Problem    Toenails-concerned about thickness and discoloration  . Foot Pain    Dorsal forefoot and midfoot right - sharp, radiating pain into leg x several months, history toe fracture  . New Patient (Initial Visit)    Est pt 32    73 y.o. female presents with the above complaint.   ROS: Denies fever chills nausea vomiting muscle aches pains calf pain back pain chest pain shortness of breath.  Past Medical History:  Diagnosis Date  . Anemia   . CHF (congestive heart failure) (HCC)   . Diabetes mellitus without complication (HCC)   . Gastric ulcer   . Glaucoma   . Hiatal hernia   . Hyperlipidemia   . Hypertension    Past Surgical History:  Procedure Laterality Date  . LEFT HEART CATH AND CORONARY ANGIOGRAPHY N/A 07/25/2016   Procedure: Left Heart Cath and Coronary Angiography;  Surgeon: Kathleene Hazel, MD;  Location: [facility] INVASIVE CV LAB;  Service: Cardiovascular;  Laterality: N/A;    Current Outpatient Medications:  .  acetaminophen (TYLENOL) 500 MG tablet, Take 500-1,000 mg by mouth every 6 (six) hours as needed for mild pain., Disp: , Rfl:  .  aspirin EC 81 MG tablet, Take 1 tablet (81 mg total) by mouth daily., Disp: , Rfl:  .  atorvastatin (LIPITOR) 20 MG tablet, TAKE 1 TABLET BY MOUTH DAILY...OFFICE VISIT FOR FURTHER REFILLS, Disp: 90 tablet, Rfl: 1 .  cholecalciferol (VITAMIN D) 1000 UNITS tablet, Take 1,000 Units by mouth daily. , Disp: , Rfl:  .  clotrimazole-betamethasone (LOTRISONE) cream, Apply 1 application topically 2 (two) times daily., Disp: 30 g, Rfl: 0 .  furosemide (LASIX) 20 MG tablet, Take 1 tablet (20 mg total) by mouth 2 (two) times daily., Disp: 180 tablet, Rfl: 1 .  Garlic 1000 MG CAPS, Take 1,000 mg by mouth daily., Disp: , Rfl:  .  hydrALAZINE (APRESOLINE) 50 MG tablet, TAKE 1 AND 1/2 TABLETS BY MOUTH THREE  TIMES DAILY. NEED FOLLOW UP FOR MORE REFILLS, Disp: 405 tablet, Rfl: 1 .  meclizine (ANTIVERT) 12.5 MG tablet, Take 1 tablet (12.5 mg total) by mouth 3 (three) times daily as needed for dizziness., Disp: 30 tablet, Rfl: 0 .  metoprolol tartrate (LOPRESSOR) 50 MG tablet, TAKE 1 TABLET BY MOUTH TWICE DAILY, Disp: 180 tablet, Rfl: 1 .  nystatin (NYSTATIN) powder, APPLY TO ABDOMEN FOLDS AND UNDER BREAST 2-3 TIMES DAILY, Disp: 60 g, Rfl: 5 .  omega-3 acid ethyl esters (LOVAZA) 1 G capsule, Take 1 g by mouth daily. , Disp: , Rfl:  .  ondansetron (ZOFRAN) 4 MG tablet, Take 1 tablet (4 mg total) by mouth every 8 (eight) hours as needed for nausea or vomiting., Disp: 20 tablet, Rfl: 0 .  pantoprazole (PROTONIX) 40 MG tablet, TAKE 1 TABLET(40 MG) BY MOUTH DAILY, Disp: 90 tablet, Rfl: 1 .  potassium chloride SA (K-DUR) 20 MEQ tablet, Take 1 tablet (20 mEq total) by mouth daily., Disp: 90 tablet, Rfl: 1 .  triamcinolone cream (KENALOG) 0.1 %, Apply 1 application topically 2 (two) times daily., Disp: 30 g, Rfl: 0 .  vitamin E 400 UNIT capsule, Take 400 Units by mouth daily., Disp: , Rfl:   Allergies  Allergen Reactions  . Lisinopril Anaphylaxis    cough   Review of Systems Objective:   Vitals:   11/19/18 1443  BP: (!) 149/74  Pulse: 68  Resp: 16    General: Well developed, nourished, in no acute distress, alert and oriented x3   Dermatological: Skin is warm, dry and supple bilateral. Nails x 10 are well maintained; remaining integument appears unremarkable at this time. There are no open sores, no preulcerative lesions, no rash or signs of infection present.  Toenails are thick yellow dystrophic clinically mycotic with Malan onychia.  Vascular: Dorsalis Pedis artery and Posterior Tibial artery pedal pulses are 2/4 bilateral with immedate capillary fill time. Pedal hair growth present. No varicosities and no lower extremity edema present bilateral.   Neruologic: Grossly intact via light touch  bilateral. Vibratory intact via tuning fork bilateral. Protective threshold with Semmes Wienstein monofilament intact to all pedal sites bilateral. Patellar and Achilles deep tendon reflexes 2+ bilateral. No Babinski or clonus noted bilateral.  Neuritis deep peroneal nerve right.  Musculoskeletal: No gross boney pedal deformities bilateral. No pain, crepitus, or limitation noted with foot and ankle range of motion bilateral. Muscular strength 5/5 in all groups tested bilateral.  Pain to the dorsal aspect of the right foot secondary to osteoarthritic changes.  Deep peroneal nerve involvement primarily.  Gait: Unassisted, Nonantalgic.    Radiographs:  Radiographs taken today demonstrate an osseously mature individual mild osteopenia.  Some early osteoarthritic changes of the midfoot  Assessment & Plan:   Assessment: Capsulitis dorsal aspect right foot.  Painful thick mycotic nails.  Plan: Discussed etiology pathology conservative or surgical therapies at this point injected the dorsal aspect of the point of maximal tenderness 10 mg of Kenalog 5 mg of Marcaine the peroneal nerve dorsal aspect of the foot.  I also debrided toenails 1 through 5 bilaterally.  She will follow-up with Dr. Elisha Ponder for routine toenail debridement.      T. [place], [place]

## 2018-12-04 ENCOUNTER — Other Ambulatory Visit: Payer: Self-pay

## 2018-12-04 ENCOUNTER — Ambulatory Visit (INDEPENDENT_AMBULATORY_CARE_PROVIDER_SITE_OTHER): Payer: Medicare Other

## 2018-12-04 DIAGNOSIS — Z23 Encounter for immunization: Secondary | ICD-10-CM | POA: Diagnosis not present

## 2018-12-16 ENCOUNTER — Other Ambulatory Visit: Payer: Self-pay | Admitting: Internal Medicine

## 2019-01-03 NOTE — Progress Notes (Signed)
Subjective:    Patient ID: Susan Brady, female    DOB: 09-27-1945, [age] y.o.   MRN: [id number]  HPI The patient is here for follow up.  She is exercising regularly.    Diabetes: She is controlling her sugars with diet. She is compliant with a diabetic diet.  She denies numbness/tingling in her feet and foot lesions. She is up-to-date with an ophthalmology examination.   Hypertension: She is taking her medication daily. She is compliant with a low sodium diet.  She denies chest pain, palpitations, edema, shortness of breath and regular headaches. She does not monitor her blood pressure at home.    Hyperlipidemia: She is taking her medication daily. She is compliant with a low fat/cholesterol diet. She denies myalgias.   GERD:  She is taking her medication daily as prescribed.  She denies any GERD symptoms and feels her GERD is well controlled.    Medications and allergies reviewed with patient and updated if appropriate.  Patient Active Problem List   Diagnosis Date Noted  . Dizziness 09/22/2018  . Nausea 09/22/2018  . Lightheadedness 04/24/2018  . Chronic diastolic heart failure (HCC) 04/14/2018  . Rash and nonspecific skin eruption 07/16/2017  . Poor balance 12/20/2016  . Right flank pain 12/20/2016  . Acute left-sided low back pain without sciatica 12/03/2016  . Normal coronary arteries 08/07/2016  . Cardiomegaly 08/07/2016  . Angina at rest [facility](HCC) 07/25/2016  . Abnormal stress test   . Chest pain of unknown etiology 07/23/2016  . Bradycardia 07/23/2016  . Dupuytren's contracture of right hand 07/19/2016  . Hyperlipidemia 06/25/2016  . Neck pain on right side 11/14/2015  . Poor sleep pattern 11/14/2015  . Constipation 11/14/2015  . Hiatal hernia, large 08/30/2015  . Gastric ulcer 08/30/2015  . Gastritis 08/15/2015  . Left shoulder pain 06/22/2015  . Essential hypertension 11/22/2014  . Non-insulin dependent type 2 diabetes mellitus (HCC) 11/22/2014  . Arthritis  11/22/2014  . Glaucoma 11/22/2014  . GERD (gastroesophageal reflux disease) 11/22/2014  . Varicose veins 11/22/2014  . Myocardial infarct, old 11/22/2014  . Superficial thrombophlebitis of lower extremity 11/22/2014    Current Outpatient Medications on File Prior to Visit  Medication Sig Dispense Refill  . acetaminophen (TYLENOL) 500 MG tablet Take 500-1,000 mg by mouth every 6 (six) hours as needed for mild pain.    Marland Kitchen. aspirin EC 81 MG tablet Take 1 tablet (81 mg total) by mouth daily.    Marland Kitchen. atorvastatin (LIPITOR) 20 MG tablet TAKE 1 TABLET BY MOUTH DAILY...OFFICE VISIT FOR FURTHER REFILLS 90 tablet 1  . cholecalciferol (VITAMIN D) 1000 UNITS tablet Take 1,000 Units by mouth daily.     . clotrimazole-betamethasone (LOTRISONE) cream Apply 1 application topically 2 (two) times daily. 30 g 0  . furosemide (LASIX) 20 MG tablet TAKE 1 TABLET(20 MG) BY MOUTH TWICE DAILY 180 tablet 1  . Garlic 1000 MG CAPS Take 1,000 mg by mouth daily.    . hydrALAZINE (APRESOLINE) 50 MG tablet TAKE 1&1/2 TABLETS BY MOUTH THREE TIMES DAILY. NEED FOLLOW UP FROM MORE REFILLS 405 tablet 1  . meclizine (ANTIVERT) 12.5 MG tablet Take 1 tablet (12.5 mg total) by mouth 3 (three) times daily as needed for dizziness. 30 tablet 0  . metoprolol tartrate (LOPRESSOR) 50 MG tablet TAKE 1 TABLET BY MOUTH TWICE DAILY 180 tablet 1  . nystatin (NYSTATIN) powder APPLY TO ABDOMEN FOLDS AND UNDER BREAST 2-3 TIMES DAILY 60 g 5  . omega-3 acid ethyl esters (LOVAZA) 1  G capsule Take 1 g by mouth daily.     . ondansetron (ZOFRAN) 4 MG tablet Take 1 tablet (4 mg total) by mouth every 8 (eight) hours as needed for nausea or vomiting. 20 tablet 0  . pantoprazole (PROTONIX) 40 MG tablet TAKE 1 TABLET(40 MG) BY MOUTH DAILY 90 tablet 1  . potassium chloride SA (KLOR-CON) 20 MEQ tablet TAKE 1 TABLET(20 MEQ) BY MOUTH DAILY 90 tablet 1  . triamcinolone cream (KENALOG) 0.1 % Apply 1 application topically 2 (two) times daily. 30 g 0  . vitamin E 400  UNIT capsule Take 400 Units by mouth daily.     No current facility-administered medications on file prior to visit.     Past Medical History:  Diagnosis Date  . Anemia   . CHF (congestive heart failure) (HCC)   . Diabetes mellitus without complication (HCC)   . Gastric ulcer   . Glaucoma   . Hiatal hernia   . Hyperlipidemia   . Hypertension     Past Surgical History:  Procedure Laterality Date  . LEFT HEART CATH AND CORONARY ANGIOGRAPHY N/A 07/25/2016   Procedure: Left Heart Cath and Coronary Angiography;  Surgeon: Kathleene Hazel, MD;  Location: [facility] INVASIVE CV LAB;  Service: Cardiovascular;  Laterality: N/A;    Social History   Socioeconomic History  . Marital status: Divorced    Spouse name: Not on file  . Number of children: Not on file  . Years of education: Not on file  . Highest education level: Not on file  Occupational History  . Not on file  Social Needs  . Financial resource strain: Not on file  . Food insecurity    Worry: Not on file    Inability: Not on file  . Transportation needs    Medical: Not on file    Non-medical: Not on file  Tobacco Use  . Smoking status: Former Games developer  . Smokeless tobacco: Never Used  Substance and Sexual Activity  . Alcohol use: No    Alcohol/week: 0.0 standard drinks  . Drug use: No  . Sexual activity: Not on file  Lifestyle  . Physical activity    Days per week: Not on file    Minutes per session: Not on file  . Stress: Not on file  Relationships  . Social Musician on phone: Not on file    Gets together: Not on file    Attends religious service: Not on file    Active member of club or organization: Not on file    Attends meetings of clubs or organizations: Not on file    Relationship status: Not on file  Other Topics Concern  . Not on file  Social History Narrative  . Not on file    Family History  Problem Relation Age of Onset  . Heart disease Mother   . Alcohol abuse Father   .  Alcohol abuse Sister   . Diabetes Sister   . Hypertension Sister   . Cancer Sister   . CAD Sister        One sister with stents  . Alcohol abuse Brother   . Diabetes Brother   . Hypertension Brother     Review of Systems     Objective:  There were no vitals filed for this visit. BP Readings from Last 3 Encounters:  11/19/18 (!) 149/74  04/24/18 116/68  12/24/17 138/80   Wt Readings from Last 3 Encounters:  04/24/18 214 lb (  97.1 kg)  12/24/17 215 lb 6.4 oz (97.7 kg)  12/05/17 215 lb (97.5 kg)   There is no height or weight on file to calculate BMI.   Physical Exam    Constitutional: Appears well-developed and well-nourished. No distress.  HENT:  Head: Normocephalic and atraumatic.  Neck: Neck supple. No tracheal deviation present. No thyromegaly present.  No cervical lymphadenopathy Cardiovascular: Normal rate, regular rhythm and normal heart sounds.   No murmur heard. No carotid bruit .  No edema Pulmonary/Chest: Effort normal and breath sounds normal. No respiratory distress. No has no wheezes. No rales.  Skin: Skin is warm and dry. Not diaphoretic.  Psychiatric: Normal mood and affect. Behavior is normal.      Assessment & Plan:    See Problem List for Assessment and Plan of chronic medical problems.   This encounter was created in error - please disregard.

## 2019-01-03 NOTE — Patient Instructions (Signed)

## 2019-01-04 ENCOUNTER — Encounter: Payer: Medicare Other | Admitting: Internal Medicine

## 2019-02-05 ENCOUNTER — Ambulatory Visit: Payer: Self-pay

## 2019-02-05 DIAGNOSIS — Z03818 Encounter for observation for suspected exposure to other biological agents ruled out: Secondary | ICD-10-CM | POA: Diagnosis not present

## 2019-02-05 NOTE — Telephone Encounter (Signed)
Patient daughter called stating that her mother has had pain down her Rt side in the flank area. Today she called because she is having pain in her right leg. Per Onalee Hua her mothers varicose veins are bulged and she feel the leg is warm.  She see no rash or redness. He mother is walking.  She has no fever. Onalee Hua states that the flank pain has kept her mother awake at night. She is voiding and urine is clear.  Patient rates pain at 7 today. Care advice read to daughter Onalee Hua.  She agree to have her mother go to UC. Will route note to office.  Reason for Disposition . [1] Thigh or calf pain AND [2] only 1 side AND [3] present > 1 hour  Answer Assessment - Initial Assessment Questions 1. ONSET: "When did the pain start?"      today 2. LOCATION: "Where is the pain located?"    Rt leg 3. PAIN: "How bad is the pain?"    (Scale 1-10; or mild, moderate, severe)   -  MILD (1-3): doesn't interfere with normal activities    -  MODERATE (4-7): interferes with normal activities (e.g., work or school) or awakens from sleep, limping    -  SEVERE (8-10): excruciating pain, unable to do any normal activities, unable to walk     7 4. WORK OR EXERCISE: "Has there been any recent work or exercise that involved this part of the body?"      no 5. CAUSE: "What do you think is causing the leg pain?"    No cause 6. OTHER SYMPTOMS: "Do you have any other symptoms?" (e.g., chest pain, back pain, breathing difficulty, swelling, rash, fever, numbness, weakness)     Flank pain. 7. PREGNANCY: "Is there any chance you are pregnant?" "When was your last menstrual period?"   N/A  Protocols used: LEG PAIN-A-AH

## 2019-02-05 NOTE — Telephone Encounter (Signed)
noted 

## 2019-02-05 NOTE — Telephone Encounter (Signed)
Called and left VM to return cll to office on daughters number. Mother is having leg pain.

## 2019-02-09 NOTE — Patient Instructions (Addendum)
  Tests ordered today. Your results will be released to MyChart (or called to you) after review.  If any changes need to be made, you will be notified at that same time.    Medications reviewed and updated.  Changes include :     Your prescription(s) have been submitted to your pharmacy. Please take as directed and contact our office if you believe you are having problem(s) with the medication(s).  A referral was ordered for   Please followup in 6 months   

## 2019-02-09 NOTE — Progress Notes (Signed)
Subjective:    Patient ID: Susan Brady, female    DOB: [birth year], 73 y.o.   MRN: [id number]  HPI The patient is here for follow up.      Medications and allergies reviewed with patient and updated if appropriate.  Patient Active Problem List   Diagnosis Date Noted  . Dizziness 09/22/2018  . Nausea 09/22/2018  . Lightheadedness 04/24/2018  . Chronic diastolic heart failure (HCC) 04/14/2018  . Rash and nonspecific skin eruption 07/16/2017  . Poor balance 12/20/2016  . Right flank pain 12/20/2016  . Acute left-sided low back pain without sciatica 12/03/2016  . Normal coronary arteries 08/07/2016  . Cardiomegaly 08/07/2016  . Angina at rest [facility](HCC) 07/25/2016  . Abnormal stress test   . Chest pain of unknown etiology 07/23/2016  . Bradycardia 07/23/2016  . Dupuytren's contracture of right hand 07/19/2016  . Hyperlipidemia 06/25/2016  . Neck pain on right side 11/14/2015  . Poor sleep pattern 11/14/2015  . Constipation 11/14/2015  . Hiatal hernia, large 08/30/2015  . Gastric ulcer 08/30/2015  . Gastritis 08/15/2015  . Left shoulder pain 06/22/2015  . Essential hypertension 11/22/2014  . Non-insulin dependent type 2 diabetes mellitus (HCC) 11/22/2014  . Arthritis 11/22/2014  . Glaucoma 11/22/2014  . GERD (gastroesophageal reflux disease) 11/22/2014  . Varicose veins 11/22/2014  . Myocardial infarct, old 11/22/2014  . Superficial thrombophlebitis of lower extremity 11/22/2014    Current Outpatient Medications on File Prior to Visit  Medication Sig Dispense Refill  . acetaminophen (TYLENOL) 500 MG tablet Take 500-1,000 mg by mouth every 6 (six) hours as needed for mild pain.    Marland Kitchen. aspirin EC 81 MG tablet Take 1 tablet (81 mg total) by mouth daily.    Marland Kitchen. atorvastatin (LIPITOR) 20 MG tablet TAKE 1 TABLET BY MOUTH DAILY...OFFICE VISIT FOR FURTHER REFILLS 90 tablet 1  . cholecalciferol (VITAMIN D) 1000 UNITS tablet Take 1,000 Units by mouth daily.     .  clotrimazole-betamethasone (LOTRISONE) cream Apply 1 application topically 2 (two) times daily. 30 g 0  . furosemide (LASIX) 20 MG tablet TAKE 1 TABLET(20 MG) BY MOUTH TWICE DAILY 180 tablet 1  . Garlic 1000 MG CAPS Take 1,000 mg by mouth daily.    . hydrALAZINE (APRESOLINE) 50 MG tablet TAKE 1&1/2 TABLETS BY MOUTH THREE TIMES DAILY. NEED FOLLOW UP FROM MORE REFILLS 405 tablet 1  . meclizine (ANTIVERT) 12.5 MG tablet Take 1 tablet (12.5 mg total) by mouth 3 (three) times daily as needed for dizziness. 30 tablet 0  . metoprolol tartrate (LOPRESSOR) 50 MG tablet TAKE 1 TABLET BY MOUTH TWICE DAILY 180 tablet 1  . nystatin (NYSTATIN) powder APPLY TO ABDOMEN FOLDS AND UNDER BREAST 2-3 TIMES DAILY 60 g 5  . omega-3 acid ethyl esters (LOVAZA) 1 G capsule Take 1 g by mouth daily.     . ondansetron (ZOFRAN) 4 MG tablet Take 1 tablet (4 mg total) by mouth every 8 (eight) hours as needed for nausea or vomiting. 20 tablet 0  . pantoprazole (PROTONIX) 40 MG tablet TAKE 1 TABLET(40 MG) BY MOUTH DAILY 90 tablet 1  . potassium chloride SA (KLOR-CON) 20 MEQ tablet TAKE 1 TABLET(20 MEQ) BY MOUTH DAILY 90 tablet 1  . triamcinolone cream (KENALOG) 0.1 % Apply 1 application topically 2 (two) times daily. 30 g 0  . vitamin E 400 UNIT capsule Take 400 Units by mouth daily.     No current facility-administered medications on file prior to visit.    Past  Medical History:  Diagnosis Date  . Anemia   . CHF (congestive heart failure) (HCC)   . Diabetes mellitus without complication (HCC)   . Gastric ulcer   . Glaucoma   . Hiatal hernia   . Hyperlipidemia   . Hypertension     Past Surgical History:  Procedure Laterality Date  . LEFT HEART CATH AND CORONARY ANGIOGRAPHY N/A 07/25/2016   Procedure: Left Heart Cath and Coronary Angiography;  Surgeon: Kathleene Hazel, MD;  Location: [facility] INVASIVE CV LAB;  Service: Cardiovascular;  Laterality: N/A;    Social History   Socioeconomic History  . Marital status:  Divorced    Spouse name: Not on file  . Number of children: Not on file  . Years of education: Not on file  . Highest education level: Not on file  Occupational History  . Not on file  Tobacco Use  . Smoking status: Former Games developer  . Smokeless tobacco: Never Used  Substance and Sexual Activity  . Alcohol use: No    Alcohol/week: 0.0 standard drinks  . Drug use: No  . Sexual activity: Not on file  Other Topics Concern  . Not on file  Social History Narrative  . Not on file   Social Determinants of Health   Financial Resource Strain:   . Difficulty of Paying Living Expenses: Not on file  Food Insecurity:   . Worried About Programme researcher, broadcasting/film/video in the Last Year: Not on file  . Ran Out of Food in the Last Year: Not on file  Transportation Needs:   . Lack of Transportation (Medical): Not on file  . Lack of Transportation (Non-Medical): Not on file  Physical Activity:   . Days of Exercise per Week: Not on file  . Minutes of Exercise per Session: Not on file  Stress:   . Feeling of Stress : Not on file  Social Connections:   . Frequency of Communication with Friends and Family: Not on file  . Frequency of Social Gatherings with Friends and Family: Not on file  . Attends Religious Services: Not on file  . Active Member of Clubs or Organizations: Not on file  . Attends Banker Meetings: Not on file  . Marital Status: Not on file    Family History  Problem Relation Age of Onset  . Heart disease Mother   . Alcohol abuse Father   . Alcohol abuse Sister   . Diabetes Sister   . Hypertension Sister   . Cancer Sister   . CAD Sister        One sister with stents  . Alcohol abuse Brother   . Diabetes Brother   . Hypertension Brother     Review of Systems     Objective:  There were no vitals filed for this visit. BP Readings from Last 3 Encounters:  11/19/18 (!) 149/74  04/24/18 116/68  12/24/17 138/80   Wt Readings from Last 3 Encounters:  04/24/18 214 lb  (97.1 kg)  12/24/17 215 lb 6.4 oz (97.7 kg)  12/05/17 215 lb (97.5 kg)   There is no height or weight on file to calculate BMI.   Physical Exam        Assessment & Plan:    See Problem List for Assessment and Plan of chronic medical problems.    This visit occurred during the SARS-CoV-2 public health emergency.  Safety protocols were in place, including screening questions prior to the visit, additional usage of staff PPE, and  extensive cleaning of exam room while observing appropriate contact time as indicated for disinfecting solutions.     This encounter was created in error - please disregard.

## 2019-02-10 ENCOUNTER — Encounter: Payer: Medicare Other | Admitting: Internal Medicine

## 2019-02-24 ENCOUNTER — Ambulatory Visit: Payer: Medicare Other | Admitting: Podiatry

## 2019-03-03 ENCOUNTER — Ambulatory Visit (INDEPENDENT_AMBULATORY_CARE_PROVIDER_SITE_OTHER): Payer: Medicare Other | Admitting: Internal Medicine

## 2019-03-03 ENCOUNTER — Other Ambulatory Visit: Payer: Self-pay

## 2019-03-03 ENCOUNTER — Encounter: Payer: Self-pay | Admitting: Internal Medicine

## 2019-03-03 VITALS — BP 144/80 | HR 63 | Temp 98.4°F | Resp 16 | Ht 66.0 in | Wt 217.0 lb

## 2019-03-03 DIAGNOSIS — E119 Type 2 diabetes mellitus without complications: Secondary | ICD-10-CM

## 2019-03-03 DIAGNOSIS — I10 Essential (primary) hypertension: Secondary | ICD-10-CM

## 2019-03-03 DIAGNOSIS — E7849 Other hyperlipidemia: Secondary | ICD-10-CM

## 2019-03-03 DIAGNOSIS — K219 Gastro-esophageal reflux disease without esophagitis: Secondary | ICD-10-CM

## 2019-03-03 DIAGNOSIS — L309 Dermatitis, unspecified: Secondary | ICD-10-CM | POA: Diagnosis not present

## 2019-03-03 DIAGNOSIS — K5900 Constipation, unspecified: Secondary | ICD-10-CM

## 2019-03-03 LAB — LIPID PANEL
Cholesterol: 128 mg/dL (ref 0–200)
HDL: 60.1 mg/dL (ref >39.00)
LDL Cholesterol: 49 mg/dL (ref 0–99)
NonHDL: 68.05
Total CHOL/HDL Ratio: 2
Triglycerides: 96 mg/dL (ref 0.0–149.0)
VLDL: 19.2 mg/dL (ref 0.0–40.0)

## 2019-03-03 LAB — CBC WITH DIFFERENTIAL/PLATELET
Basophils Absolute: 0 10*3/uL (ref 0.0–0.1)
Basophils Relative: 0.8 % (ref 0.0–3.0)
Eosinophils Absolute: 0.2 10*3/uL (ref 0.0–0.7)
Eosinophils Relative: 3.2 % (ref 0.0–5.0)
HCT: 36.5 % (ref 36.0–46.0)
Hemoglobin: 11.6 g/dL — ABNORMAL LOW (ref 12.0–15.0)
Lymphocytes Relative: 39.8 % (ref 12.0–46.0)
Lymphs Abs: 2.4 10*3/uL (ref 0.7–4.0)
MCHC: 31.8 g/dL (ref 30.0–36.0)
MCV: 89.3 fl (ref 78.0–100.0)
Monocytes Absolute: 0.6 10*3/uL (ref 0.1–1.0)
Monocytes Relative: 10.4 % (ref 3.0–12.0)
Neutro Abs: 2.7 10*3/uL (ref 1.4–7.7)
Neutrophils Relative %: 45.8 % (ref 43.0–77.0)
Platelets: 314 10*3/uL (ref 150.0–400.0)
RBC: 4.09 Mil/uL (ref 3.87–5.11)
RDW: 13.8 % (ref 11.5–15.5)
WBC: 6 10*3/uL (ref 4.0–10.5)

## 2019-03-03 LAB — COMPREHENSIVE METABOLIC PANEL
ALT: 9 U/L (ref 0–35)
AST: 18 U/L (ref 0–37)
Albumin: 4.4 g/dL (ref 3.5–5.2)
Alkaline Phosphatase: 55 U/L (ref 39–117)
BUN: 12 mg/dL (ref 6–23)
CO2: 30 mEq/L (ref 19–32)
Calcium: 9.6 mg/dL (ref 8.4–10.5)
Chloride: 104 mEq/L (ref 96–112)
Creatinine, Ser: 0.72 mg/dL (ref 0.40–1.20)
GFR: 95.71 mL/min (ref >60.00)
Glucose, Bld: 94 mg/dL (ref 70–99)
Potassium: 3.8 mEq/L (ref 3.5–5.1)
Sodium: 141 mEq/L (ref 135–145)
Total Bilirubin: 0.4 mg/dL (ref 0.2–1.2)
Total Protein: 7.1 g/dL (ref 6.0–8.3)

## 2019-03-03 NOTE — Assessment & Plan Note (Signed)
She is taking Metamucil daily, but sometimes goes to the bathroom every other day.  She will sometimes experience left-sided abdominal discomfort, which goes away after she has a bowel movement Possibly still mildly constipated Increase Metamucil dosing to see if that helps

## 2019-03-03 NOTE — Assessment & Plan Note (Signed)
Chronic BP well controlled Current regimen effective and well tolerated Continue current medications at current doses CMP 

## 2019-03-03 NOTE — Assessment & Plan Note (Addendum)
Chronic Diet controlled Check a1c Low sugar / carb diet Stressed regular exercise   

## 2019-03-03 NOTE — Progress Notes (Signed)
Subjective:    Patient ID: Susan Brady, female    DOB: 03-30-1945, 74 y.o.   MRN: [id number]  HPI The patient is here for follow up of their chronic medical problems, including diabetes, hypertension, hyperlipidemia, gerd.  She is taking all of her medications as prescribed.    She is exercising regularly.   She is eating healthy.   She has dryness/peeling skin and mild redness on her hands, elbows and around her nose.  She denies prior episodes.  She has tried lotion and Vaseline w/o improvement.  It started about 7-10 days ago.  Her elbows hurt a little with pressure on them.  She denies itching.    When she does not go to the bathroom everyday she has left sided abdominal discomfort which goes away with a BM.   Medications and allergies reviewed with patient and updated if appropriate.  Patient Active Problem List   Diagnosis Date Noted  . Dizziness 09/22/2018  . Nausea 09/22/2018  . Lightheadedness 04/24/2018  . Chronic diastolic heart failure (HCC) 04/14/2018  . Rash and nonspecific skin eruption 07/16/2017  . Poor balance 12/20/2016  . Right flank pain 12/20/2016  . Acute left-sided low back pain without sciatica 12/03/2016  . Normal coronary arteries 08/07/2016  . Cardiomegaly 08/07/2016  . Angina at rest [facility]) 07/25/2016  . Dupuytren's contracture of right hand 07/19/2016  . Hyperlipidemia 06/25/2016  . Neck pain on right side 11/14/2015  . Poor sleep pattern 11/14/2015  . Constipation 11/14/2015  . Hiatal hernia, large 08/30/2015  . Gastric ulcer 08/30/2015  . Gastritis 08/15/2015  . Left shoulder pain 06/22/2015  . Essential hypertension 11/22/2014  . Non-insulin dependent type 2 diabetes mellitus (HCC) 11/22/2014  . Arthritis 11/22/2014  . Glaucoma 11/22/2014  . GERD (gastroesophageal reflux disease) 11/22/2014  . Varicose veins 11/22/2014  . Myocardial infarct, old 11/22/2014  . Superficial thrombophlebitis of lower extremity 11/22/2014    Current  Outpatient Medications on File Prior to Visit  Medication Sig Dispense Refill  . acetaminophen (TYLENOL) 500 MG tablet Take 500-1,000 mg by mouth every 6 (six) hours as needed for mild pain.    Marland Kitchen aspirin EC 81 MG tablet Take 1 tablet (81 mg total) by mouth daily.    Marland Kitchen atorvastatin (LIPITOR) 20 MG tablet TAKE 1 TABLET BY MOUTH DAILY...OFFICE VISIT FOR FURTHER REFILLS 90 tablet 1  . cholecalciferol (VITAMIN D) 1000 UNITS tablet Take 1,000 Units by mouth daily.     . furosemide (LASIX) 20 MG tablet TAKE 1 TABLET(20 MG) BY MOUTH TWICE DAILY 180 tablet 1  . Garlic 1000 MG CAPS Take 1,000 mg by mouth daily.    . hydrALAZINE (APRESOLINE) 50 MG tablet TAKE 1&1/2 TABLETS BY MOUTH THREE TIMES DAILY. NEED FOLLOW UP FROM MORE REFILLS 405 tablet 1  . metoprolol tartrate (LOPRESSOR) 50 MG tablet TAKE 1 TABLET BY MOUTH TWICE DAILY 180 tablet 1  . omega-3 acid ethyl esters (LOVAZA) 1 G capsule Take 1 g by mouth daily.     . pantoprazole (PROTONIX) 40 MG tablet TAKE 1 TABLET(40 MG) BY MOUTH DAILY 90 tablet 1  . potassium chloride SA (KLOR-CON) 20 MEQ tablet TAKE 1 TABLET(20 MEQ) BY MOUTH DAILY 90 tablet 1  . triamcinolone cream (KENALOG) 0.1 % Apply 1 application topically 2 (two) times daily. 30 g 0  . vitamin E 400 UNIT capsule Take 400 Units by mouth daily.     No current facility-administered medications on file prior to visit.    Past  Medical History:  Diagnosis Date  . Anemia   . CHF (congestive heart failure) ([place])   . Diabetes mellitus without complication ([place])   . Gastric ulcer   . Glaucoma   . Hiatal hernia   . Hyperlipidemia   . Hypertension     Past Surgical History:  Procedure Laterality Date  . LEFT HEART CATH AND CORONARY ANGIOGRAPHY N/A 07/25/2016   Procedure: Left Heart Cath and Coronary Angiography;  Surgeon: Burnell Blanks, MD;  Location: [place] CV LAB;  Service: Cardiovascular;  Laterality: N/A;    Social History   Socioeconomic History  . Marital status:  Divorced    Spouse name: Not on file  . Number of children: Not on file  . Years of education: Not on file  . Highest education level: Not on file  Occupational History  . Not on file  Tobacco Use  . Smoking status: Former Research scientist (life sciences)  . Smokeless tobacco: Never Used  Substance and Sexual Activity  . Alcohol use: No    Alcohol/week: 0.0 standard drinks  . Drug use: No  . Sexual activity: Not on file  Other Topics Concern  . Not on file  Social History Narrative  . Not on file   Social Determinants of Health   Financial Resource Strain:   . Difficulty of Paying Living Expenses: Not on file  Food Insecurity:   . Worried About Charity fundraiser in the Last Year: Not on file  . Ran Out of Food in the Last Year: Not on file  Transportation Needs:   . Lack of Transportation (Medical): Not on file  . Lack of Transportation (Non-Medical): Not on file  Physical Activity:   . Days of Exercise per Week: Not on file  . Minutes of Exercise per Session: Not on file  Stress:   . Feeling of Stress : Not on file  Social Connections:   . Frequency of Communication with Friends and Family: Not on file  . Frequency of Social Gatherings with Friends and Family: Not on file  . Attends Religious Services: Not on file  . Active Member of Clubs or Organizations: Not on file  . Attends Archivist Meetings: Not on file  . Marital Status: Not on file    Family History  Problem Relation Age of Onset  . Heart disease Mother   . Alcohol abuse Father   . Alcohol abuse Sister   . Diabetes Sister   . Hypertension Sister   . Cancer Sister   . CAD Sister        One sister with stents  . Alcohol abuse Brother   . Diabetes Brother   . Hypertension Brother     Review of Systems  Constitutional: Negative for chills and fever.  Respiratory: Negative for cough, shortness of breath and wheezing.   Cardiovascular: Positive for palpitations (sometimes) and leg swelling (right ankle). Negative  for chest pain.  Neurological: Negative for light-headedness and headaches.       Objective:   Vitals:   03/03/19 1316  BP: (!) 144/80  Pulse: 63  Resp: 16  Temp: 98.4 F (36.9 C)  SpO2: 97%   BP Readings from Last 3 Encounters:  03/03/19 (!) 144/80  11/19/18 (!) 149/74  04/24/18 116/68   Wt Readings from Last 3 Encounters:  03/03/19 217 lb (98.4 kg)  04/24/18 214 lb (97.1 kg)  12/24/17 215 lb 6.4 oz (97.7 kg)   Body mass index is 35.02 kg/m.  Physical Exam    Constitutional: Appears well-developed and well-nourished. No distress.  HENT:  Head: Normocephalic and atraumatic.  Neck: Neck supple. No tracheal deviation present. No thyromegaly present.  No cervical lymphadenopathy Cardiovascular: Normal rate, regular rhythm and normal heart sounds.   No murmur heard. No carotid bruit .  No edema Pulmonary/Chest: Effort normal and breath sounds normal. No respiratory distress. No has no wheezes. No rales.  Skin: Skin is warm and dry. Not diaphoretic.  Psychiatric: Normal mood and affect. Behavior is normal.      Assessment & Plan:    See Problem List for Assessment and Plan of chronic medical problems.    This visit occurred during the SARS-CoV-2 public health emergency.  Safety protocols were in place, including screening questions prior to the visit, additional usage of staff PPE, and extensive cleaning of exam room while observing appropriate contact time as indicated for disinfecting solutions.

## 2019-03-03 NOTE — Assessment & Plan Note (Signed)
Chronic GERD controlled Continue daily medication  

## 2019-03-03 NOTE — Assessment & Plan Note (Signed)
The areas of dryness and peeling skin are new possibly eczema On hands, elbows and corners of nose Try CeraVe cream, can add hydrocortisone cream if needed Call if no improvement

## 2019-03-03 NOTE — Patient Instructions (Addendum)
Try CeraVe lotion on your dry skin.  You can add some cortisone cream.     Blood work was ordered.     Medications reviewed and updated.  Changes include :  none       Please followup in 6 months

## 2019-03-03 NOTE — Assessment & Plan Note (Signed)
Chronic Check lipid panel  Continue daily statin Regular exercise and healthy diet encouraged  

## 2019-03-04 LAB — HEMOGLOBIN A1C: Hgb A1c MFr Bld: 6.1 % (ref 4.6–6.5)

## 2019-03-15 ENCOUNTER — Encounter: Payer: Self-pay | Admitting: Internal Medicine

## 2019-03-15 ENCOUNTER — Ambulatory Visit (INDEPENDENT_AMBULATORY_CARE_PROVIDER_SITE_OTHER): Payer: Medicare Other | Admitting: Internal Medicine

## 2019-03-15 DIAGNOSIS — M542 Cervicalgia: Secondary | ICD-10-CM | POA: Insufficient documentation

## 2019-03-15 DIAGNOSIS — L309 Dermatitis, unspecified: Secondary | ICD-10-CM | POA: Diagnosis not present

## 2019-03-15 MED ORDER — TRIAMCINOLONE ACETONIDE 0.1 % EX CREA: 1.0000 "application " | TOPICAL_CREAM | Freq: Two times a day (BID) | CUTANEOUS | 0 refills | Status: DC

## 2019-03-15 NOTE — Progress Notes (Signed)
Virtual Visit via Video Note  I connected with Susan Brady on 03/15/19 at  3:45 PM EST by a video enabled telemedicine application and verified that I am speaking with the correct person using two identifiers.   I discussed the limitations of evaluation and management by telemedicine and the availability of in person appointments. The patient expressed understanding and agreed to proceed.  Present for the visit:  Myself, Dr Cheryll Cockayne, Susan Brady and her daughter.  The patient is currently at home and I am in the office.    No referring provider.    History of Present Illness: This is an acute visit for neck pain and stiffness.  She is having left posterior neck pain going down her into her back.  It started about three days ago.  It is stiff at night.  She also states increased joint pain.  She has been taking Tylenol and takes the edge off.  She is also been using some topical arthritis medications.  She is also applied heat.  She had her covid vaccine last week.  They wonder if this is a side effect from the vaccine.  She had the Covid vaccine in the left arm.  It was sore, but now her arm no longer hurts.  Just her neck, knees and ankles.  She is still experiencing peeling of the skin and eczema-like symptoms.  The over-the-counter Eucerin cream and cortisone are not helping.   Review of Systems  Constitutional: Negative for fever.  Musculoskeletal: Positive for joint pain and neck pain (pain and stiffness).  Skin: Positive for rash (Peeling of the skin, severe dryness).  Neurological: Positive for dizziness and headaches. Negative for tingling.     Social History   Socioeconomic History  . Marital status: Divorced    Spouse name: Not on file  . Number of children: Not on file  . Years of education: Not on file  . Highest education level: Not on file  Occupational History  . Not on file  Tobacco Use  . Smoking status: Former Games developer  . Smokeless tobacco: Never Used   Substance and Sexual Activity  . Alcohol use: No    Alcohol/week: 0.0 standard drinks  . Drug use: No  . Sexual activity: Not on file  Other Topics Concern  . Not on file  Social History Narrative  . Not on file   Social Determinants of Health   Financial Resource Strain:   . Difficulty of Paying Living Expenses: Not on file  Food Insecurity:   . Worried About Programme researcher, broadcasting/film/video in the Last Year: Not on file  . Ran Out of Food in the Last Year: Not on file  Transportation Needs:   . Lack of Transportation (Medical): Not on file  . Lack of Transportation (Non-Medical): Not on file  Physical Activity:   . Days of Exercise per Week: Not on file  . Minutes of Exercise per Session: Not on file  Stress:   . Feeling of Stress : Not on file  Social Connections:   . Frequency of Communication with Friends and Family: Not on file  . Frequency of Social Gatherings with Friends and Family: Not on file  . Attends Religious Services: Not on file  . Active Member of Clubs or Organizations: Not on file  . Attends Banker Meetings: Not on file  . Marital Status: Not on file     Observations/Objective: Appears well in NAD Breathing normally Full range of motion of  neck, but appears to have discomfort with certain neck movements Skin on palms of hands peeling  Assessment and Plan:  See Problem List for Assessment and Plan of chronic medical problems.   Follow Up Instructions:    I discussed the assessment and treatment plan with the patient. The patient was provided an opportunity to ask questions and all were answered. The patient agreed with the plan and demonstrated an understanding of the instructions.   The patient was advised to call back or seek an in-person evaluation if the symptoms worsen or if the condition fails to improve as anticipated.    Binnie Rail, MD

## 2019-03-15 NOTE — Assessment & Plan Note (Signed)
Acute Neck pain on left side radiating down towards the back Sounds musculoskeletal in nature, no symptoms of cervical radiculopathy Would like to avoid prednisone because she does have diabetes and she just received the Covid vaccine She is also experiencing joint pain-unsure if these are truly side effects to the Covid vaccine or coincidence Continue Tylenol, can take Naprosyn, which she has at home Continue heat, topical arthritis medications Call if no improvement

## 2019-03-15 NOTE — Assessment & Plan Note (Signed)
Dryness and peeling of the skin at some of her joints and especially her hands has not improved with over-the-counter cream and hydrocortisone OTC She is currently using Eucerin and will continue She has used triamcinolone 0.1% in the past and we will try that If there is no improvement will refer to dermatology

## 2019-03-16 ENCOUNTER — Other Ambulatory Visit: Payer: Self-pay | Admitting: Internal Medicine

## 2019-03-22 ENCOUNTER — Telehealth: Payer: Self-pay | Admitting: Internal Medicine

## 2019-03-22 DIAGNOSIS — R21 Rash and other nonspecific skin eruption: Secondary | ICD-10-CM

## 2019-03-22 NOTE — Telephone Encounter (Signed)
    Patients daughter calling to request Dermatology referral   Please advise

## 2019-03-23 NOTE — Telephone Encounter (Signed)
ordered

## 2019-03-28 NOTE — Progress Notes (Signed)
Subjective:    Patient ID: Susan Brady, female    DOB: January 05, 1946, 74 y.o.   MRN: [id number]  HPI The patient is here for an acute visit.   Right knee pain and swelling:  7 years ago she fell down a flight of stairs and injured her whole right side. She is unsure if that is related to her current pain.  The pain and swelling in her right knee started about 10 days ago.  One day it was popping a lot when she got up and walked.  The popping was painful.  The next day the knee was swollen and has been swollen since then.  The swelling is better.  The pain started with the swelling and has been constant.  The pain is inside the knee.  She still has the popping.  She has tried tylenol and that helped a little.  She tried ice, heat and topical ointments that helped minimally and temporarily.    She has not seen anyone for the knee in the past.   Medications and allergies reviewed with patient and updated if appropriate.  Patient Active Problem List   Diagnosis Date Noted  . Neck pain, musculoskeletal 03/15/2019  . Eczema 03/03/2019  . Dizziness 09/22/2018  . Nausea 09/22/2018  . Lightheadedness 04/24/2018  . Chronic diastolic heart failure ([place]) 04/14/2018  . Rash and nonspecific skin eruption 07/16/2017  . Poor balance 12/20/2016  . Right flank pain 12/20/2016  . Acute left-sided low back pain without sciatica 12/03/2016  . Normal coronary arteries 08/07/2016  . Cardiomegaly 08/07/2016  . Angina at rest [facility]) 07/25/2016  . Dupuytren's contracture of right hand 07/19/2016  . Hyperlipidemia 06/25/2016  . Neck pain on right side 11/14/2015  . Poor sleep pattern 11/14/2015  . Constipation 11/14/2015  . Hiatal hernia, large 08/30/2015  . Gastric ulcer 08/30/2015  . Gastritis 08/15/2015  . Left shoulder pain 06/22/2015  . Essential hypertension 11/22/2014  . Non-insulin dependent type 2 diabetes mellitus ([place]) 11/22/2014  . Arthritis 11/22/2014  . Glaucoma 11/22/2014  . GERD  (gastroesophageal reflux disease) 11/22/2014  . Varicose veins 11/22/2014  . Myocardial infarct, old 11/22/2014  . Superficial thrombophlebitis of lower extremity 11/22/2014    Current Outpatient Medications on File Prior to Visit  Medication Sig Dispense Refill  . acetaminophen (TYLENOL) 500 MG tablet Take 500-1,000 mg by mouth every 6 (six) hours as needed for mild pain.    Marland Kitchen aspirin EC 81 MG tablet Take 1 tablet (81 mg total) by mouth daily.    Marland Kitchen atorvastatin (LIPITOR) 20 MG tablet TAKE 1 TABLET BY MOUTH DAILY...OFFICE VISIT FOR FURTHER REFILLS 90 tablet 1  . cholecalciferol (VITAMIN D) 1000 UNITS tablet Take 1,000 Units by mouth daily.     . furosemide (LASIX) 20 MG tablet TAKE 1 TABLET(20 MG) BY MOUTH TWICE DAILY 180 tablet 1  . Garlic 4166 MG CAPS Take 1,000 mg by mouth daily.    . hydrALAZINE (APRESOLINE) 50 MG tablet TAKE 1&1/2 TABLETS BY MOUTH THREE TIMES DAILY. NEED FOLLOW UP FROM MORE REFILLS 405 tablet 1  . metoprolol tartrate (LOPRESSOR) 50 MG tablet TAKE 1 TABLET BY MOUTH TWICE DAILY 180 tablet 1  . omega-3 acid ethyl esters (LOVAZA) 1 G capsule Take 1 g by mouth daily.     . pantoprazole (PROTONIX) 40 MG tablet TAKE 1 TABLET(40 MG) BY MOUTH DAILY 90 tablet 1  . potassium chloride SA (KLOR-CON) 20 MEQ tablet TAKE 1 TABLET(20 MEQ) BY MOUTH DAILY 90  tablet 1  . triamcinolone cream (KENALOG) 0.1 % Apply 1 application topically 2 (two) times daily. 30 g 0  . vitamin E 400 UNIT capsule Take 400 Units by mouth daily.     No current facility-administered medications on file prior to visit.    Past Medical History:  Diagnosis Date  . Anemia   . CHF (congestive heart failure) (HCC)   . Diabetes mellitus without complication (HCC)   . Gastric ulcer   . Glaucoma   . Hiatal hernia   . Hyperlipidemia   . Hypertension     Past Surgical History:  Procedure Laterality Date  . LEFT HEART CATH AND CORONARY ANGIOGRAPHY N/A 07/25/2016   Procedure: Left Heart Cath and Coronary  Angiography;  Surgeon: Kathleene Hazel, MD;  Location: [facility] INVASIVE CV LAB;  Service: Cardiovascular;  Laterality: N/A;    Social History   Socioeconomic History  . Marital status: Divorced    Spouse name: Not on file  . Number of children: Not on file  . Years of education: Not on file  . Highest education level: Not on file  Occupational History  . Not on file  Tobacco Use  . Smoking status: Former Games developer  . Smokeless tobacco: Never Used  Substance and Sexual Activity  . Alcohol use: No    Alcohol/week: 0.0 standard drinks  . Drug use: No  . Sexual activity: Not on file  Other Topics Concern  . Not on file  Social History Narrative  . Not on file   Social Determinants of Health   Financial Resource Strain:   . Difficulty of Paying Living Expenses: Not on file  Food Insecurity:   . Worried About Programme researcher, broadcasting/film/video in the Last Year: Not on file  . Ran Out of Food in the Last Year: Not on file  Transportation Needs:   . Lack of Transportation (Medical): Not on file  . Lack of Transportation (Non-Medical): Not on file  Physical Activity:   . Days of Exercise per Week: Not on file  . Minutes of Exercise per Session: Not on file  Stress:   . Feeling of Stress : Not on file  Social Connections:   . Frequency of Communication with Friends and Family: Not on file  . Frequency of Social Gatherings with Friends and Family: Not on file  . Attends Religious Services: Not on file  . Active Member of Clubs or Organizations: Not on file  . Attends Banker Meetings: Not on file  . Marital Status: Not on file    Family History  Problem Relation Age of Onset  . Heart disease Mother   . Alcohol abuse Father   . Alcohol abuse Sister   . Diabetes Sister   . Hypertension Sister   . Cancer Sister   . CAD Sister        One sister with stents  . Alcohol abuse Brother   . Diabetes Brother   . Hypertension Brother     Review of Systems  Constitutional:  Negative for chills and fever.  Musculoskeletal: Positive for arthralgias and joint swelling.  Skin: Negative for color change and wound.  Neurological: Negative for weakness and numbness.       Objective:   Vitals:   03/29/19 0915  BP: (!) 146/72  Pulse: 72  Resp: 16  Temp: 98.3 F (36.8 C)  SpO2: 96%   BP Readings from Last 3 Encounters:  03/29/19 (!) 146/72  03/03/19 (!) 144/80  11/19/18 (!) 149/74   Wt Readings from Last 3 Encounters:  03/29/19 216 lb (98 kg)  03/03/19 217 lb (98.4 kg)  04/24/18 214 lb (97.1 kg)   Body mass index is 34.86 kg/m.   Physical Exam    A right knee exam was performed.   SKIN: intact, no bruising or erythema SWELLING: No EFFUSION: yes, mild WARMTH: no warmth  TENDERNESS: moderate tenderness patellar tendon and throughout anterior aspect of knee ROM: full extension, full flexion, pain with full extension and flexion GAIT: walks with a limp - unable to fully bear weight on right leg  NEUROLOGICAL EXAM: normal sensation  CALF TENDERNESS: no        Assessment & Plan:    See Problem List for Assessment and Plan of chronic medical problems.     This visit occurred during the SARS-CoV-2 public health emergency.  Safety protocols were in place, including screening questions prior to the visit, additional usage of staff PPE, and extensive cleaning of exam room while observing appropriate contact time as indicated for disinfecting solutions.

## 2019-03-29 ENCOUNTER — Other Ambulatory Visit: Payer: Self-pay

## 2019-03-29 ENCOUNTER — Ambulatory Visit (INDEPENDENT_AMBULATORY_CARE_PROVIDER_SITE_OTHER): Payer: Medicare Other

## 2019-03-29 ENCOUNTER — Ambulatory Visit (INDEPENDENT_AMBULATORY_CARE_PROVIDER_SITE_OTHER): Payer: Medicare Other | Admitting: Internal Medicine

## 2019-03-29 ENCOUNTER — Encounter: Payer: Self-pay | Admitting: Internal Medicine

## 2019-03-29 VITALS — BP 146/72 | HR 72 | Temp 98.3°F | Resp 16 | Ht 66.0 in | Wt 216.0 lb

## 2019-03-29 DIAGNOSIS — M25561 Pain in right knee: Secondary | ICD-10-CM | POA: Diagnosis not present

## 2019-03-29 DIAGNOSIS — M25461 Effusion, right knee: Secondary | ICD-10-CM

## 2019-03-29 DIAGNOSIS — M1711 Unilateral primary osteoarthritis, right knee: Secondary | ICD-10-CM | POA: Diagnosis not present

## 2019-03-29 MED ORDER — MELOXICAM 7.5 MG PO TABS: 7.5000 mg | ORAL_TABLET | Freq: Every day | ORAL | 0 refills | Status: DC

## 2019-03-29 MED ORDER — METHYLPREDNISOLONE 4 MG PO TBPK: ORAL_TABLET | ORAL | 0 refills | Status: DC

## 2019-03-29 NOTE — Assessment & Plan Note (Signed)
New Started about 10 days ago Has popping, pain and swelling Possible flare of arthritis He does have some tenderness and mild effusion on exam Symptoms have improved X-ray of knee today Medrol Dosepak-after completing that she will take meloxicam 7.5 mg daily for 14 days only.  Advised to take with food If symptoms do not improve or recur she would benefit from seeing sports medicine for possible steroid injection in the knee

## 2019-03-29 NOTE — Patient Instructions (Addendum)
Call the dermatology practices below to make an appointment - it looks like your insurance does not require a referral from me, so it will be quicker for you to make the appointment.    [facility] [address] [phone]  [facility]  [address] [place] # [place]  [phone]  Dr. Mertha Finders, MD [phone] [address]  [phone]   Have an x-ray of your right knee today.   For the knee you can ice it.   Take the steroid as prescribed.  After completing the steroid take meloxicam 7.5 mg daily for two weeks.    Please call if there is no improvement in your knee and I will refer you to a sports medicine doctor.

## 2019-04-09 DIAGNOSIS — B351 Tinea unguium: Secondary | ICD-10-CM | POA: Diagnosis not present

## 2019-04-09 DIAGNOSIS — L308 Other specified dermatitis: Secondary | ICD-10-CM | POA: Diagnosis not present

## 2019-04-09 DIAGNOSIS — L603 Nail dystrophy: Secondary | ICD-10-CM | POA: Diagnosis not present

## 2019-05-10 NOTE — Progress Notes (Signed)
Subjective:    Patient ID: Susan Brady, female    DOB: [birth year], 74 y.o.   MRN: [id number]  HPI The patient is here for an acute visit.   7 years ago she fell on her right side and has had right knee pain since then.  She has right shoulder pain as well and thinks it is from the fall   Her right knee and shoulder pain have been bad for the past 2-3 weeks.  She was here last month with right knee effusion and pain.  She did have steroid injections in the knee in the past.     She takes tyenol and uses rubs.  It is hard to get around - uses cane or walker.    Heart fluttering intermittently x 3-4 week.  Some days she feels is 2/day.  Sometimes at night when she is at rest.  She denies cp, sob, and lightheadedness/dizziness.  It can last 15-20 min.     Medications and allergies reviewed with patient and updated if appropriate.  Patient Active Problem List   Diagnosis Date Noted  . Pain and swelling of knee, right 03/29/2019  . Neck pain, musculoskeletal 03/15/2019  . Eczema 03/03/2019  . Dizziness 09/22/2018  . Nausea 09/22/2018  . Lightheadedness 04/24/2018  . Chronic diastolic heart failure (HCC) 04/14/2018  . Rash and nonspecific skin eruption 07/16/2017  . Poor balance 12/20/2016  . Right flank pain 12/20/2016  . Acute left-sided low back pain without sciatica 12/03/2016  . Normal coronary arteries 08/07/2016  . Cardiomegaly 08/07/2016  . Angina at rest [facility]) 07/25/2016  . Dupuytren's contracture of right hand 07/19/2016  . Hyperlipidemia 06/25/2016  . Neck pain on right side 11/14/2015  . Poor sleep pattern 11/14/2015  . Constipation 11/14/2015  . Hiatal hernia, large 08/30/2015  . Gastric ulcer 08/30/2015  . Gastritis 08/15/2015  . Left shoulder pain 06/22/2015  . Essential hypertension 11/22/2014  . Non-insulin dependent type 2 diabetes mellitus (HCC) 11/22/2014  . Arthritis 11/22/2014  . Glaucoma 11/22/2014  . GERD (gastroesophageal reflux disease)  11/22/2014  . Varicose veins 11/22/2014  . Myocardial infarct, old 11/22/2014  . Superficial thrombophlebitis of lower extremity 11/22/2014    Current Outpatient Medications on File Prior to Visit  Medication Sig Dispense Refill  . acetaminophen (TYLENOL) 500 MG tablet Take 500-1,000 mg by mouth every 6 (six) hours as needed for mild pain.    Marland Kitchen aspirin EC 81 MG tablet Take 1 tablet (81 mg total) by mouth daily.    Marland Kitchen atorvastatin (LIPITOR) 20 MG tablet TAKE 1 TABLET BY MOUTH DAILY...OFFICE VISIT FOR FURTHER REFILLS 90 tablet 1  . cholecalciferol (VITAMIN D) 1000 UNITS tablet Take 1,000 Units by mouth daily.     . furosemide (LASIX) 20 MG tablet TAKE 1 TABLET(20 MG) BY MOUTH TWICE DAILY 180 tablet 1  . Garlic 1000 MG CAPS Take 1,000 mg by mouth daily.    . hydrALAZINE (APRESOLINE) 50 MG tablet TAKE 1&1/2 TABLETS BY MOUTH THREE TIMES DAILY. NEED FOLLOW UP FROM MORE REFILLS 405 tablet 1  . metoprolol tartrate (LOPRESSOR) 50 MG tablet TAKE 1 TABLET BY MOUTH TWICE DAILY 180 tablet 1  . omega-3 acid ethyl esters (LOVAZA) 1 G capsule Take 1 g by mouth daily.     . pantoprazole (PROTONIX) 40 MG tablet TAKE 1 TABLET(40 MG) BY MOUTH DAILY 90 tablet 1  . potassium chloride SA (KLOR-CON) 20 MEQ tablet TAKE 1 TABLET(20 MEQ) BY MOUTH DAILY 90 tablet 1  .  triamcinolone cream (KENALOG) 0.1 % Apply 1 application topically 2 (two) times daily. 30 g 0  . vitamin E 400 UNIT capsule Take 400 Units by mouth daily.     No current facility-administered medications on file prior to visit.    Past Medical History:  Diagnosis Date  . Anemia   . CHF (congestive heart failure) ([place])   . Diabetes mellitus without complication ([place])   . Gastric ulcer   . Glaucoma   . Hiatal hernia   . Hyperlipidemia   . Hypertension     Past Surgical History:  Procedure Laterality Date  . LEFT HEART CATH AND CORONARY ANGIOGRAPHY N/A 07/25/2016   Procedure: Left Heart Cath and Coronary Angiography;  Surgeon: Burnell Blanks, MD;  Location: [facility] CV LAB;  Service: Cardiovascular;  Laterality: N/A;    Social History   Socioeconomic History  . Marital status: Divorced    Spouse name: Not on file  . Number of children: Not on file  . Years of education: Not on file  . Highest education level: Not on file  Occupational History  . Not on file  Tobacco Use  . Smoking status: Former Research scientist (life sciences)  . Smokeless tobacco: Never Used  Substance and Sexual Activity  . Alcohol use: No    Alcohol/week: 0.0 standard drinks  . Drug use: No  . Sexual activity: Not on file  Other Topics Concern  . Not on file  Social History Narrative  . Not on file   Social Determinants of Health   Financial Resource Strain:   . Difficulty of Paying Living Expenses:   Food Insecurity:   . Worried About Charity fundraiser in the Last Year:   . Arboriculturist in the Last Year:   Transportation Needs:   . Film/video editor (Medical):   Marland Kitchen Lack of Transportation (Non-Medical):   Physical Activity:   . Days of Exercise per Week:   . Minutes of Exercise per Session:   Stress:   . Feeling of Stress :   Social Connections:   . Frequency of Communication with Friends and Family:   . Frequency of Social Gatherings with Friends and Family:   . Attends Religious Services:   . Active Member of Clubs or Organizations:   . Attends Archivist Meetings:   Marland Kitchen Marital Status:     Family History  Problem Relation Age of Onset  . Heart disease Mother   . Alcohol abuse Father   . Alcohol abuse Sister   . Diabetes Sister   . Hypertension Sister   . Cancer Sister   . CAD Sister        One sister with stents  . Alcohol abuse Brother   . Diabetes Brother   . Hypertension Brother     Review of Systems  Constitutional: Negative for fever.  Respiratory: Negative for cough, shortness of breath and wheezing.   Cardiovascular: Positive for palpitations and leg swelling. Negative for chest pain.    Musculoskeletal: Positive for arthralgias.  Neurological: Negative for light-headedness and headaches.       Objective:   Vitals:   05/11/19 1455  BP: (!) 146/84  Pulse: 63  Resp: 16  Temp: 98.6 F (37 C)  SpO2: 97%   BP Readings from Last 3 Encounters:  05/11/19 (!) 146/84  03/29/19 (!) 146/72  03/03/19 (!) 144/80   Wt Readings from Last 3 Encounters:  05/11/19 214 lb (97.1 kg)  03/29/19 216 lb (  98 kg)  03/03/19 217 lb (98.4 kg)   Body mass index is 34.54 kg/m.   Physical Exam    Constitutional: Appears well-developed and well-nourished. No distress.  Head: Normocephalic and atraumatic.  Neck: Neck supple. No tracheal deviation present. No thyromegaly present.  No cervical lymphadenopathy Cardiovascular: tachycardic , regular rhythm and normal heart sounds.  No murmur heard. No carotid bruit .  Mild b/l edema Pulmonary/Chest: Effort normal and breath sounds normal. No respiratory distress. No has no wheezes. No rales.  Skin: Skin is warm and dry. Not diaphoretic.  Psychiatric: Normal mood and affect. Behavior is normal.       Assessment & Plan:    See Problem List for Assessment and Plan of chronic medical problems.    This visit occurred during the SARS-CoV-2 public health emergency.  Safety protocols were in place, including screening questions prior to the visit, additional usage of staff PPE, and extensive cleaning of exam room while observing appropriate contact time as indicated for disinfecting solutions.

## 2019-05-11 ENCOUNTER — Ambulatory Visit (INDEPENDENT_AMBULATORY_CARE_PROVIDER_SITE_OTHER): Payer: Medicare Other | Admitting: Internal Medicine

## 2019-05-11 ENCOUNTER — Other Ambulatory Visit: Payer: Self-pay

## 2019-05-11 ENCOUNTER — Encounter: Payer: Self-pay | Admitting: Internal Medicine

## 2019-05-11 VITALS — BP 146/84 | HR 63 | Temp 98.6°F | Resp 16 | Ht 66.0 in | Wt 214.0 lb

## 2019-05-11 DIAGNOSIS — G8929 Other chronic pain: Secondary | ICD-10-CM | POA: Diagnosis not present

## 2019-05-11 DIAGNOSIS — R002 Palpitations: Secondary | ICD-10-CM | POA: Diagnosis not present

## 2019-05-11 DIAGNOSIS — M25461 Effusion, right knee: Secondary | ICD-10-CM | POA: Diagnosis not present

## 2019-05-11 DIAGNOSIS — M25561 Pain in right knee: Secondary | ICD-10-CM | POA: Diagnosis not present

## 2019-05-11 DIAGNOSIS — M25511 Pain in right shoulder: Secondary | ICD-10-CM

## 2019-05-11 MED ORDER — TRAMADOL HCL 50 MG PO TABS: 50.0000 mg | ORAL_TABLET | Freq: Three times a day (TID) | ORAL | 0 refills | Status: AC | PRN

## 2019-05-11 NOTE — Patient Instructions (Addendum)
An EKG was done.  A holter monitor was ordered   Medications reviewed and updated.  Changes include :   Tramadol for your pain - take only as needed.  Your prescription(s) have been submitted to your pharmacy. Please take as directed and contact our office if you believe you are having problem(s) with the medication(s).  A referral was ordered for sports medicine   Follow up in 4 weeks

## 2019-05-11 NOTE — Assessment & Plan Note (Signed)
Acute on chronic Likely OA Will refer to sports medicine Deferred PT Pain not controlled with tylenol.  Given h/o gastritis/ulcers will prescribe tramadol.  Advised this is potentially addicting and discussed possible side effects F/u in 3-4 weeks

## 2019-05-11 NOTE — Assessment & Plan Note (Signed)
Acute Intermittent x 3-4 weeks, not always associated with activity, no concerning assoc symptoms tachycardic on exam - regular EKG today  - sinus tachy at 137, low QRS, LAD, poor R wave progression - no major changes from last EKG from 2018 After EKG her HR was in the 70's No change in medication - will refer to cardiology

## 2019-05-11 NOTE — Assessment & Plan Note (Signed)
Acute on chronic pain Pain and swelling  Likely OA - was getting injection in the past and would benefit from an injection Will refer to sports medicine Deferred PT Pain not controlled with tylenol.  Given h/o gastritis/ulcers will prescribe tramadol.  Advised this is potentially addicting and discussed possible side effects F/u in 3-4 weeks

## 2019-05-12 ENCOUNTER — Telehealth: Payer: Self-pay | Admitting: Cardiology

## 2019-05-12 NOTE — Telephone Encounter (Signed)
Informed daughter she is ok to attend appointment as pt has dementia.  

## 2019-05-12 NOTE — Telephone Encounter (Signed)
New Message    Pts daughter states she will need to assist the pt to her appt because she has dementia    Please advise

## 2019-05-13 ENCOUNTER — Ambulatory Visit (INDEPENDENT_AMBULATORY_CARE_PROVIDER_SITE_OTHER): Payer: Medicare Other | Admitting: Family Medicine

## 2019-05-13 ENCOUNTER — Other Ambulatory Visit: Payer: Self-pay

## 2019-05-13 ENCOUNTER — Ambulatory Visit (INDEPENDENT_AMBULATORY_CARE_PROVIDER_SITE_OTHER): Payer: Medicare Other

## 2019-05-13 VITALS — Wt 214.6 lb

## 2019-05-13 DIAGNOSIS — M25511 Pain in right shoulder: Secondary | ICD-10-CM | POA: Diagnosis not present

## 2019-05-13 DIAGNOSIS — M545 Low back pain, unspecified: Secondary | ICD-10-CM

## 2019-05-13 DIAGNOSIS — M25561 Pain in right knee: Secondary | ICD-10-CM

## 2019-05-13 DIAGNOSIS — R269 Unspecified abnormalities of gait and mobility: Secondary | ICD-10-CM | POA: Diagnosis not present

## 2019-05-13 DIAGNOSIS — G8929 Other chronic pain: Secondary | ICD-10-CM

## 2019-05-13 DIAGNOSIS — M25562 Pain in left knee: Secondary | ICD-10-CM

## 2019-05-13 NOTE — Patient Instructions (Addendum)
Thank you for coming in today. Try over the counter voltaren gel up to 4x daily on the knee.  Ok to try on the shoulder as well.  Get xrays today.  I should have results for you tomorrow.  Plan for home health physical therapy.  You should hear from them soon.  If nobody calls you in 1 week let me know.   Do the exercises Kirt Boys showed you for your knee to improve leg strength.   Please perform the exercise program that we have prepared for you and gone over in detail on a daily basis.  In addition to the handout you were provided you can access your program through: [email or web address]   Your unique program code is:  [email or web address]   Recheck with me in 4 weeks.  Return sooner if needed.   We can do injections if needed.   Ok to use heat on the back.

## 2019-05-13 NOTE — Progress Notes (Signed)
Subjective:    CC: R shoulder and R knee  I, Molly Weber, LAT, ATC, am serving as scribe for Dr. Clementeen Graham.  HPI: Pt is a 20/o female presenting w/ c/o R shoulder and R knee pain that have worsened over the past 2-3 weeks.  She reports having fallen approximately 7 years onto her R side and has been having intermittent pain since.  She notes popping. Pain with stairs and getting out of a chair. Pain is moderate.  -Treatments tried: Tylenol, Tramadol; prior R knee injections. The injection helped.   Diagnostic imaging: R knee XR-03/29/19  R shoulder pain: Pain for months. She locates her pain to anterior shoulder.  She rates her pain medium to severe. She notes some grinding. Pain worse with abduction and reaching back. She has tried tylenol. She notes that she fell down 3 stairs 7 years ago. She had pain initially but that resolved until recently.   Also notes pain in the low back for about 1 month. Worse with forward flexion. No weakness, numbness or bowel or bladder problems.    Pertinent review of Systems: No fevers or chills  Relevant historical information: History of heart failure and diabetes.   Objective:   Weight 214 pounds General: Well Developed, well nourished, and in no acute distress.   MSK:  Right shoulder: Normal-appearing Nontender. Range of motion full abduction and internal rotation to lumbar spine.  External rotation 20 degrees beyond neutral position. Intact strength. Negative Hawkins and Neer's test.  Negative empty can test.  L-spine nontender to midline normal motion to flexion limited extension.  Normal rotation. Lower extremity strength reflexes and sensation are intact distally.   Right knee: No effusion.  Mild quad atrophy. Nontender. Range of motion 0-120 degrees with crepitations. Stable ligamentous exam. Intact strength.  Left knee: No effusion mild quad atrophy nontender.  Range of motion full.  Crepitations with motion. Stable  ligamentous exam. Intact strength.  Gait with cane  Lab and Radiology Results DG Knee Complete 4 Views Right  Result Date: 03/29/2019 CLINICAL DATA:  Chronic knee pain and swelling. EXAM: RIGHT KNEE - COMPLETE 4+ VIEW COMPARISON:  None. FINDINGS: No acute fracture or dislocation. Moderate joint effusion. Mild medial compartment joint space narrowing with small marginal osteophytes. Subchondral lucency in the patella, likely degenerative cysts. Osteopenia. Soft tissues are unremarkable. IMPRESSION: 1. Mild medial compartment osteoarthritis. 2. Moderate joint effusion. Electronically Signed   By: Obie Dredge M.D.   On: 03/29/2019 15:56   EXAM: LUMBAR SPINE - 2-3 VIEW  COMPARISON:  CT 08/12/2015  FINDINGS: SI joint arthritis. Trace anterolisthesis of L5 on S1, unchanged. Possible minimal superior endplate deformity at L2 anteriorly. Mild degenerative changes at T12-L1 and L5-S1. Small anterior osteophytes.  IMPRESSION: 1. Possible mild acute superior endplate deformity at L2. 2. Degenerative changes.   Electronically Signed   By: Jasmine Pang M.D.   On: 12/02/2016 17:04  I, Clementeen Graham, personally (independently) visualized and performed the interpretation of the images attached in this note.  X-ray images right shoulder obtained today personally and independently reviewed. Mild glenohumeral DJD.  Moderate AC DJD.  No acute fractures. Await formal radiology review   Impression and Recommendations:    Assessment and Plan: 74 y.o. female with  Right shoulder pain, right knee pain, low back pain.  Right shoulder pain: Chronic for several months now.  Pain unlikely to be due to significant rotator cuff tendinopathy.  X-ray today shows DJD however radiology overread is still pending.  Discussed possible options.  Patient would like to avoid injection for now.  Recommend Voltaren gel home exercise program and home health physical therapy.  Recheck back in 1 month if not  better would consider steroid injection.  Right knee pain: Chronic with crepitations.  Patient does have patellofemoral DJD seen on x-ray obtained February of this year.  Again discussed options again patient would like to avoid steroid injection.  Plan for quad strengthening exercises as taught in clinic today by ATC, and referral to home health physical therapy.  Voltaren gel should also be helpful.  Recheck back again in a month if not better would consider injection.  L-spine pain: Ongoing for about a month without injury.  She certainly does have degenerative changes seen on x-ray L-spine October 2018 however I do not believe her pain today is mostly due to DJD.  Myofascial strain dysfunction most likely explanation.  Plan for home health physical therapy heating pad and mobility as tolerated.  Home health physical therapy should also help with impaired gait and reduce her fall risk.   Orders Placed This Encounter  Procedures  . DG Shoulder Right    Standing Status:   Future    Standing Expiration Date:   07/12/2020    Order Specific Question:   Reason for Exam (SYMPTOM  OR DIAGNOSIS REQUIRED)    Answer:   eval chronic shoulder pain    Order Specific Question:   Preferred imaging location?    Answer:   [facility]    Order Specific Question:   Radiology Contrast Protocol - do NOT remove file path    Answer:   [email or web address]  . Ambulatory referral to Home Health    Referral Priority:   Routine    Referral Type:   Home Health Care    Referral Reason:   Specialty Services Required    Requested Specialty:   [place]    Number of Visits Requested:   1   No orders of the defined types were placed in this encounter.   Discussed warning signs or symptoms. Please see discharge instructions. Patient expresses understanding.   The above documentation has been reviewed and is accurate and complete Lynne Leader

## 2019-05-14 NOTE — Progress Notes (Signed)
X-ray right shoulder does have some arthritis in it.  If not better with physical therapy will proceed with injection likely.

## 2019-05-31 DIAGNOSIS — Z7189 Other specified counseling: Secondary | ICD-10-CM | POA: Insufficient documentation

## 2019-05-31 NOTE — Progress Notes (Signed)
Cardiology Office Note   Date:  06/02/2019   ID:  Susan Brady, DOB 09/27/1945, MRN [id number]  PCP:  Binnie Rail, MD  Cardiologist:   Minus Breeding, MD    Chief Complaint  Patient presents with  . Palpitations      History of Present Illness: Susan Brady is a 74 y.o. female who presents for follow up of chest pain.  She did have an abnormal Lexiscan Myoview.  However she had normal coronaries on cath.  She did have cardiomegaly on CXR but had an echo after the last office appt with NL LV function.  There was grade two diastolic dysfunction.    Since I last saw her she has not had any further chest pain.  However, she is referred back now because of sensation of her heart beating fast.  This seems to happen at night.  She lives with her daughter and her daughter sees her wake up because of this sensation.  They have not taken her pulse but she feels her heart beating fast.  It will last for about a minute.  She has to sit up.  She might get a little short of breath.  It will eventually go away.  She might feel a little lightheaded but she has not had any syncope.  She does not have chest pressure, neck or arm discomfort.  She does not have PND or orthopnea.  She does not do a lot of activities because of joint problems but she walks very little dog.     Past Medical History:  Diagnosis Date  . Anemia   . CHF (congestive heart failure) ([place])   . Diabetes mellitus without complication ([place])   . Gastric ulcer   . Glaucoma   . Hiatal hernia   . Hyperlipidemia   . Hypertension     Past Surgical History:  Procedure Laterality Date  . LEFT HEART CATH AND CORONARY ANGIOGRAPHY N/A 07/25/2016   Procedure: Left Heart Cath and Coronary Angiography;  Surgeon: Burnell Blanks, MD;  Location: [facility];  Service: Cardiovascular;  Laterality: N/A;     Current Outpatient Medications  Medication Sig Dispense Refill  . acetaminophen (TYLENOL) 500 MG tablet Take  500-1,000 mg by mouth every 6 (six) hours as needed for mild pain.    Marland Kitchen aspirin EC 81 MG tablet Take 1 tablet (81 mg total) by mouth daily.    Marland Kitchen atorvastatin (LIPITOR) 20 MG tablet TAKE 1 TABLET BY MOUTH DAILY...OFFICE VISIT FOR FURTHER REFILLS 90 tablet 1  . augmented betamethasone dipropionate (DIPROLENE-AF) 0.05 % cream     . cholecalciferol (VITAMIN D) 1000 UNITS tablet Take 1,000 Units by mouth daily.     . furosemide (LASIX) 20 MG tablet TAKE 1 TABLET(20 MG) BY MOUTH TWICE DAILY 180 tablet 1  . Garlic 6761 MG CAPS Take 1,000 mg by mouth daily.    . hydrALAZINE (APRESOLINE) 50 MG tablet TAKE 1&1/2 TABLETS BY MOUTH THREE TIMES DAILY. NEED FOLLOW UP FROM MORE REFILLS 405 tablet 1  . metoprolol tartrate (LOPRESSOR) 50 MG tablet TAKE 1 TABLET BY MOUTH TWICE DAILY 180 tablet 1  . omega-3 acid ethyl esters (LOVAZA) 1 G capsule Take 1 g by mouth daily.     . pantoprazole (PROTONIX) 40 MG tablet TAKE 1 TABLET(40 MG) BY MOUTH DAILY 90 tablet 1  . potassium chloride SA (KLOR-CON) 20 MEQ tablet TAKE 1 TABLET(20 MEQ) BY MOUTH DAILY 90 tablet 1  . triamcinolone cream (KENALOG) 0.1 %  Apply 1 application topically 2 (two) times daily. 30 g 0  . vitamin E 400 UNIT capsule Take 400 Units by mouth daily.     No current facility-administered medications for this visit.    Allergies:   Lisinopril    ROS:  Please see the history of present illness.   Otherwise, review of systems are positive for none.   All other systems are reviewed and negative.    PHYSICAL EXAM: VS:  BP 128/76   Pulse (!) 57   Temp (!) 97.2 F (36.2 C)   Ht 5\' 7"  (1.702 m)   Wt 213 lb 9.6 oz (96.9 kg)   SpO2 96%   BMI 33.45 kg/m  , BMI Body mass index is 33.45 kg/m. GENERAL:  Well appearing NECK:  No jugular venous distention, waveform within normal limits, carotid upstroke brisk and symmetric, no bruits, no thyromegaly LUNGS:  Clear to auscultation bilaterally CHEST:  Unremarkable HEART:  PMI not displaced or sustained,S1  and S2 within normal limits, no S3, no S4, no clicks, no rubs, no murmurs ABD:  Flat, positive bowel sounds normal in frequency in pitch, no bruits, no rebound, no guarding, no midline pulsatile mass, no hepatomegaly, no splenomegaly EXT:  2 plus pulses throughout, no edema, no cyanosis no clubbing   EKG:  EKG is ordered today. Sinus bradycardia, rate 57, low voltage throughout, poor anterior R wave progression, no acute ST-T wave changes.   Recent Labs: 03/03/2019: ALT 9; BUN 12; Creatinine, Ser 0.72; Hemoglobin 11.6; Platelets 314.0; Potassium 3.8; Sodium 141    Lipid Panel    Component Value Date/Time   CHOL 128 03/03/2019 1343   TRIG 96.0 03/03/2019 1343   HDL 60.10 03/03/2019 1343   CHOLHDL 2 03/03/2019 1343   VLDL 19.2 03/03/2019 1343   LDLCALC 49 03/03/2019 1343      Wt Readings from Last 3 Encounters:  06/02/19 213 lb 9.6 oz (96.9 kg)  05/13/19 214 lb 9.6 oz (97.3 kg)  05/11/19 214 lb (97.1 kg)      Other studies Reviewed: Additional studies/ records that were reviewed today include: Labs. Review of the above records demonstrates:  Please see elsewhere in the note.     ASSESSMENT AND PLAN:   Palpitations The patient has palpitations as described above.  She will need a 2-week event monitor as they are relatively infrequent although she does have them every week.  Further management will be based on this.   I will check a TSH  Essential hypertension The blood pressure is at target.  No change in therapy.   Cardiomegaly Echo with NL EF.  She had no evidence of LVH previously.  There was some diastolic dysfunction.   Covid education She has had her vaccines.  Current medicines are reviewed at length with the patient today.  The patient does not have concerns regarding medicines.  The following changes have been made:  None  Labs/ tests ordered today include:    Orders Placed This Encounter  Procedures  . TSH  . LONG TERM MONITOR (3-14 DAYS)  . EKG  12-Lead     Disposition:   FU with me in one month.    Signed, 05/13/19, MD  06/02/2019 2:49 PM    [facility]

## 2019-06-02 ENCOUNTER — Encounter: Payer: Self-pay | Admitting: Cardiology

## 2019-06-02 ENCOUNTER — Other Ambulatory Visit: Payer: Self-pay

## 2019-06-02 ENCOUNTER — Ambulatory Visit (INDEPENDENT_AMBULATORY_CARE_PROVIDER_SITE_OTHER): Payer: Medicare Other | Admitting: Cardiology

## 2019-06-02 VITALS — BP 128/76 | HR 57 | Temp 97.2°F | Ht 67.0 in | Wt 213.6 lb

## 2019-06-02 DIAGNOSIS — Z7189 Other specified counseling: Secondary | ICD-10-CM | POA: Diagnosis not present

## 2019-06-02 DIAGNOSIS — I10 Essential (primary) hypertension: Secondary | ICD-10-CM

## 2019-06-02 DIAGNOSIS — R002 Palpitations: Secondary | ICD-10-CM | POA: Diagnosis not present

## 2019-06-02 DIAGNOSIS — R001 Bradycardia, unspecified: Secondary | ICD-10-CM | POA: Diagnosis not present

## 2019-06-02 DIAGNOSIS — I517 Cardiomegaly: Secondary | ICD-10-CM

## 2019-06-02 DIAGNOSIS — R072 Precordial pain: Secondary | ICD-10-CM

## 2019-06-02 NOTE — Patient Instructions (Addendum)
Medication Instructions:  No changes *If you need a refill on your cardiac medications before your next appointment, please call your pharmacy*  Lab Work: Your physician recommends that you return for lab work today (TSH)  Testing/Procedures: Your physician has recommended that you wear an event monitor. Event monitors are medical devices that record the heart's electrical activity. Doctors most often [place] these monitors to diagnose arrhythmias. Arrhythmias are problems with the speed or rhythm of the heartbeat. The monitor is a small, portable device. You can wear one while you do your normal daily activities. This is usually used to diagnose what is causing palpitations/syncope (passing out).  Follow-Up: At [facility], you and your health needs are our priority.  As part of our continuing mission to provide you with exceptional heart care, we have created designated Provider Care Teams.  These Care Teams include your primary Cardiologist (physician) and Advanced Practice Providers (APPs -  Physician Assistants and Nurse Practitioners) who all work together to provide you with the care you need, when you need it.  We recommend signing up for the patient portal called "MyChart".  Sign up information is provided on this After Visit Summary.  MyChart is used to connect with patients for Virtual Visits (Telemedicine).  Patients are able to view lab/test results, encounter notes, upcoming appointments, etc.  Non-urgent messages can be sent to your provider as well.   To learn more about what you can do with MyChart, go to [email or web address].    Your next appointment:   1 month(s)  The format for your next appointment:   In Person  Provider:   Rollene Rotunda, MD   Other Instructions Your physician has recommended that you wear a 14 DAY ZIO-PATCH monitor. The Zio patch cardiac monitor continuously records heart rhythm data for up to 14 days, this is for patients being evaluated for  multiple types heart rhythms. For the first 24 hours post application, please avoid getting the Zio monitor wet in the shower or by excessive sweating during exercise. After that, feel free to carry on with regular activities. Keep soaps and lotions away from the ZIO XT Patch.  This will be mailed to you, please expect 7-10 days to receive.  Can be placed at our [facility] location - [address].

## 2019-06-03 ENCOUNTER — Encounter: Payer: Self-pay | Admitting: *Deleted

## 2019-06-03 LAB — TSH: TSH: 0.479 u[IU]/mL (ref 0.450–4.500)

## 2019-06-03 NOTE — Progress Notes (Signed)
Patient ID: Susan Brady, female   DOB: 12/19/1945, 74 y.o.   MRN: [id number] Patient enrolled for [employer] to ship a 14 day ZIO XT long term holter monitor to her home.

## 2019-06-07 NOTE — Progress Notes (Deleted)
Subjective:    Patient ID: Susan Brady, female    DOB: 1945/05/29, 74 y.o.   MRN: [id number]  HPI The patient is here for follow up of their chronic medical problems, including   Chronic right shoulder pain, chronic right knee pain:  We started tramadol 4 weeks ago and referred her to sports medicine.      Medications and allergies reviewed with patient and updated if appropriate.  Patient Active Problem List   Diagnosis Date Noted  . Educated about COVID-19 virus infection 05/31/2019  . Chronic right shoulder pain 05/11/2019  . Palpitations 05/11/2019  . Pain and swelling of knee, right 03/29/2019  . Neck pain, musculoskeletal 03/15/2019  . Eczema 03/03/2019  . Dizziness 09/22/2018  . Nausea 09/22/2018  . Lightheadedness 04/24/2018  . Chronic diastolic heart failure ([place]) 04/14/2018  . Rash and nonspecific skin eruption 07/16/2017  . Poor balance 12/20/2016  . Right flank pain 12/20/2016  . Acute left-sided low back pain without sciatica 12/03/2016  . Normal coronary arteries 08/07/2016  . Cardiomegaly 08/07/2016  . Dupuytren's contracture of right hand 07/19/2016  . Hyperlipidemia 06/25/2016  . Neck pain on right side 11/14/2015  . Poor sleep pattern 11/14/2015  . Constipation 11/14/2015  . Hiatal hernia, large 08/30/2015  . Gastric ulcer 08/30/2015  . Gastritis 08/15/2015  . Left shoulder pain 06/22/2015  . Essential hypertension 11/22/2014  . Non-insulin dependent type 2 diabetes mellitus ([place]) 11/22/2014  . Arthritis 11/22/2014  . Glaucoma 11/22/2014  . GERD (gastroesophageal reflux disease) 11/22/2014  . Varicose veins 11/22/2014  . Myocardial infarct, old 11/22/2014  . Superficial thrombophlebitis of lower extremity 11/22/2014    Current Outpatient Medications on File Prior to Visit  Medication Sig Dispense Refill  . acetaminophen (TYLENOL) 500 MG tablet Take 500-1,000 mg by mouth every 6 (six) hours as needed for mild pain.    Marland Kitchen aspirin EC 81 MG  tablet Take 1 tablet (81 mg total) by mouth daily.    Marland Kitchen atorvastatin (LIPITOR) 20 MG tablet TAKE 1 TABLET BY MOUTH DAILY...OFFICE VISIT FOR FURTHER REFILLS 90 tablet 1  . augmented betamethasone dipropionate (DIPROLENE-AF) 0.05 % cream     . cholecalciferol (VITAMIN D) 1000 UNITS tablet Take 1,000 Units by mouth daily.     . furosemide (LASIX) 20 MG tablet TAKE 1 TABLET(20 MG) BY MOUTH TWICE DAILY 180 tablet 1  . Garlic 0932 MG CAPS Take 1,000 mg by mouth daily.    . hydrALAZINE (APRESOLINE) 50 MG tablet TAKE 1&1/2 TABLETS BY MOUTH THREE TIMES DAILY. NEED FOLLOW UP FROM MORE REFILLS 405 tablet 1  . metoprolol tartrate (LOPRESSOR) 50 MG tablet TAKE 1 TABLET BY MOUTH TWICE DAILY 180 tablet 1  . omega-3 acid ethyl esters (LOVAZA) 1 G capsule Take 1 g by mouth daily.     . pantoprazole (PROTONIX) 40 MG tablet TAKE 1 TABLET(40 MG) BY MOUTH DAILY 90 tablet 1  . potassium chloride SA (KLOR-CON) 20 MEQ tablet TAKE 1 TABLET(20 MEQ) BY MOUTH DAILY 90 tablet 1  . triamcinolone cream (KENALOG) 0.1 % Apply 1 application topically 2 (two) times daily. 30 g 0  . vitamin E 400 UNIT capsule Take 400 Units by mouth daily.     No current facility-administered medications on file prior to visit.    Past Medical History:  Diagnosis Date  . Anemia   . CHF (congestive heart failure) ([place])   . Diabetes mellitus without complication ([place])   . Gastric ulcer   . Glaucoma   .  Hiatal hernia   . Hyperlipidemia   . Hypertension     Past Surgical History:  Procedure Laterality Date  . LEFT HEART CATH AND CORONARY ANGIOGRAPHY N/A 07/25/2016   Procedure: Left Heart Cath and Coronary Angiography;  Surgeon: Kathleene Hazel, MD;  Location: [facility];  Service: Cardiovascular;  Laterality: N/A;    Social History   Socioeconomic History  . Marital status: Divorced    Spouse name: Not on file  . Number of children: Not on file  . Years of education: Not on file  . Highest education level: Not on  file  Occupational History  . Not on file  Tobacco Use  . Smoking status: Former Games developer  . Smokeless tobacco: Never Used  Substance and Sexual Activity  . Alcohol use: No    Alcohol/week: 0.0 standard drinks  . Drug use: No  . Sexual activity: Not on file  Other Topics Concern  . Not on file  Social History Narrative  . Not on file   Social Determinants of Health   Financial Resource Strain:   . Difficulty of Paying Living Expenses:   Food Insecurity:   . Worried About Programme researcher, broadcasting/film/video in the Last Year:   . Barista in the Last Year:   Transportation Needs:   . Freight forwarder (Medical):   Marland Kitchen Lack of Transportation (Non-Medical):   Physical Activity:   . Days of Exercise per Week:   . Minutes of Exercise per Session:   Stress:   . Feeling of Stress :   Social Connections:   . Frequency of Communication with Friends and Family:   . Frequency of Social Gatherings with Friends and Family:   . Attends Religious Services:   . Active Member of Clubs or Organizations:   . Attends Banker Meetings:   Marland Kitchen Marital Status:     Family History  Problem Relation Age of Onset  . Heart disease Mother   . Alcohol abuse Father   . Alcohol abuse Sister   . Diabetes Sister   . Hypertension Sister   . Cancer Sister   . CAD Sister        One sister with stents  . Alcohol abuse Brother   . Diabetes Brother   . Hypertension Brother     Review of Systems     Objective:  There were no vitals filed for this visit. BP Readings from Last 3 Encounters:  06/02/19 128/76  05/11/19 (!) 146/84  03/29/19 (!) 146/72   Wt Readings from Last 3 Encounters:  06/02/19 213 lb 9.6 oz (96.9 kg)  05/13/19 214 lb 9.6 oz (97.3 kg)  05/11/19 214 lb (97.1 kg)   There is no height or weight on file to calculate BMI.   Physical Exam    Constitutional: Appears well-developed and well-nourished. No distress.  HENT:  Head: Normocephalic and atraumatic.  Neck: Neck  supple. No tracheal deviation present. No thyromegaly present.  No cervical lymphadenopathy Cardiovascular: Normal rate, regular rhythm and normal heart sounds.   No murmur heard. No carotid bruit .  No edema Pulmonary/Chest: Effort normal and breath sounds normal. No respiratory distress. No has no wheezes. No rales.  Skin: Skin is warm and dry. Not diaphoretic.  Psychiatric: Normal mood and affect. Behavior is normal.      Assessment & Plan:    See Problem List for Assessment and Plan of chronic medical problems.    This visit occurred during  the SARS-CoV-2 public health emergency.  Safety protocols were in place, including screening questions prior to the visit, additional usage of staff PPE, and extensive cleaning of exam room while observing appropriate contact time as indicated for disinfecting solutions.

## 2019-06-08 ENCOUNTER — Ambulatory Visit: Payer: Medicare Other | Admitting: Internal Medicine

## 2019-06-08 ENCOUNTER — Other Ambulatory Visit: Payer: Medicare Other

## 2019-06-10 NOTE — Progress Notes (Signed)
Subjective:    Patient ID: Susan Brady, female    DOB: Dec 29, 1945, 74 y.o.   MRN: [id number]  HPI The patient is here for follow up of their chronic medical problems, arthritis pain, hypertension  Chronic right shoulder pain, chronic right knee pain:  We started tramadol 4 weeks ago and referred her to sports medicine.  The tramadol did not help.  She did see sports medicine and was referred for home physical therapy.  She is follow-up and if there is no improvement may be eligible for an injection in her knee.  She is doing exercises on her own at home.  Her pain is better, but still there.  She is trying to walk a little.    Palpitations, sinus tachycardia on EKG at her last visit-she did see cardiology.  A Holter monitor was ordered.  She will follow-up with cardiology for the monitor.  Her HR here is very high.  She states she feels this about 3/day. She may have SOB, lightheadedness and just uncomfortable.  She is wearing her monitor now.    GERD:  She is taking her medication daily as prescribed.  She denies any GERD symptoms and feels her GERD is well controlled.    Medications and allergies reviewed with patient and updated if appropriate.  Patient Active Problem List   Diagnosis Date Noted  . Educated about COVID-19 virus infection 05/31/2019  . Chronic right shoulder pain 05/11/2019  . Palpitations 05/11/2019  . Pain and swelling of knee, right 03/29/2019  . Neck pain, musculoskeletal 03/15/2019  . Eczema 03/03/2019  . Dizziness 09/22/2018  . Nausea 09/22/2018  . Lightheadedness 04/24/2018  . Chronic diastolic heart failure (HCC) 04/14/2018  . Rash and nonspecific skin eruption 07/16/2017  . Poor balance 12/20/2016  . Right flank pain 12/20/2016  . Acute left-sided low back pain without sciatica 12/03/2016  . Normal coronary arteries 08/07/2016  . Cardiomegaly 08/07/2016  . Dupuytren's contracture of right hand 07/19/2016  . Hyperlipidemia 06/25/2016  . Neck pain on  right side 11/14/2015  . Poor sleep pattern 11/14/2015  . Constipation 11/14/2015  . Hiatal hernia, large 08/30/2015  . Gastric ulcer 08/30/2015  . Gastritis 08/15/2015  . Left shoulder pain 06/22/2015  . Essential hypertension 11/22/2014  . Non-insulin dependent type 2 diabetes mellitus (HCC) 11/22/2014  . Arthritis 11/22/2014  . Glaucoma 11/22/2014  . GERD (gastroesophageal reflux disease) 11/22/2014  . Varicose veins 11/22/2014  . Myocardial infarct, old 11/22/2014  . Superficial thrombophlebitis of lower extremity 11/22/2014    Current Outpatient Medications on File Prior to Visit  Medication Sig Dispense Refill  . acetaminophen (TYLENOL) 500 MG tablet Take 500-1,000 mg by mouth every 6 (six) hours as needed for mild pain.    Marland Kitchen aspirin EC 81 MG tablet Take 1 tablet (81 mg total) by mouth daily.    Marland Kitchen atorvastatin (LIPITOR) 20 MG tablet TAKE 1 TABLET BY MOUTH DAILY...OFFICE VISIT FOR FURTHER REFILLS 90 tablet 1  . augmented betamethasone dipropionate (DIPROLENE-AF) 0.05 % cream     . cholecalciferol (VITAMIN D) 1000 UNITS tablet Take 1,000 Units by mouth daily.     . furosemide (LASIX) 20 MG tablet TAKE 1 TABLET(20 MG) BY MOUTH TWICE DAILY 180 tablet 1  . Garlic 1000 MG CAPS Take 1,000 mg by mouth daily.    . hydrALAZINE (APRESOLINE) 50 MG tablet TAKE 1&1/2 TABLETS BY MOUTH THREE TIMES DAILY. NEED FOLLOW UP FROM MORE REFILLS 405 tablet 1  . metoprolol tartrate (LOPRESSOR) 50  MG tablet TAKE 1 TABLET BY MOUTH TWICE DAILY 180 tablet 1  . omega-3 acid ethyl esters (LOVAZA) 1 G capsule Take 1 g by mouth daily.     . pantoprazole (PROTONIX) 40 MG tablet TAKE 1 TABLET(40 MG) BY MOUTH DAILY 90 tablet 1  . potassium chloride SA (KLOR-CON) 20 MEQ tablet TAKE 1 TABLET(20 MEQ) BY MOUTH DAILY 90 tablet 1  . triamcinolone cream (KENALOG) 0.1 % Apply 1 application topically 2 (two) times daily. 30 g 0  . vitamin E 400 UNIT capsule Take 400 Units by mouth daily.     No current  facility-administered medications on file prior to visit.    Past Medical History:  Diagnosis Date  . Anemia   . CHF (congestive heart failure) (HCC)   . Diabetes mellitus without complication (HCC)   . Gastric ulcer   . Glaucoma   . Hiatal hernia   . Hyperlipidemia   . Hypertension     Past Surgical History:  Procedure Laterality Date  . LEFT HEART CATH AND CORONARY ANGIOGRAPHY N/A 07/25/2016   Procedure: Left Heart Cath and Coronary Angiography;  Surgeon: Kathleene Hazel, MD;  Location: [facility] INVASIVE CV LAB;  Service: Cardiovascular;  Laterality: N/A;    Social History   Socioeconomic History  . Marital status: Divorced    Spouse name: Not on file  . Number of children: Not on file  . Years of education: Not on file  . Highest education level: Not on file  Occupational History  . Not on file  Tobacco Use  . Smoking status: Former Games developer  . Smokeless tobacco: Never Used  Substance and Sexual Activity  . Alcohol use: No    Alcohol/week: 0.0 standard drinks  . Drug use: No  . Sexual activity: Not on file  Other Topics Concern  . Not on file  Social History Narrative  . Not on file   Social Determinants of Health   Financial Resource Strain:   . Difficulty of Paying Living Expenses:   Food Insecurity:   . Worried About Programme researcher, broadcasting/film/video in the Last Year:   . Barista in the Last Year:   Transportation Needs:   . Freight forwarder (Medical):   Marland Kitchen Lack of Transportation (Non-Medical):   Physical Activity:   . Days of Exercise per Week:   . Minutes of Exercise per Session:   Stress:   . Feeling of Stress :   Social Connections:   . Frequency of Communication with Friends and Family:   . Frequency of Social Gatherings with Friends and Family:   . Attends Religious Services:   . Active Member of Clubs or Organizations:   . Attends Banker Meetings:   Marland Kitchen Marital Status:     Family History  Problem Relation Age of Onset  .  Heart disease Mother   . Alcohol abuse Father   . Alcohol abuse Sister   . Diabetes Sister   . Hypertension Sister   . Cancer Sister   . CAD Sister        One sister with stents  . Alcohol abuse Brother   . Diabetes Brother   . Hypertension Brother     Review of Systems  Constitutional: Negative for fever.  Respiratory: Positive for shortness of breath (with elevated HR).   Cardiovascular: Positive for chest pain and palpitations. Negative for leg swelling.  Musculoskeletal: Positive for arthralgias and back pain.  Neurological: Positive for light-headedness (occ). Negative  for headaches.       Objective:   Vitals:   06/11/19 0906  BP: (!) 152/98  Pulse: (!) 132  Resp: 16  Temp: 98.8 F (37.1 C)  SpO2: 97%   BP Readings from Last 3 Encounters:  06/11/19 (!) 152/98  06/02/19 128/76  05/11/19 (!) 146/84   Wt Readings from Last 3 Encounters:  06/11/19 213 lb (96.6 kg)  06/02/19 213 lb 9.6 oz (96.9 kg)  05/13/19 214 lb 9.6 oz (97.3 kg)   Body mass index is 33.36 kg/m.   Physical Exam    Constitutional: Appears well-developed and well-nourished. No distress.  HENT:  Head: Normocephalic and atraumatic.  Neck: Neck supple. No tracheal deviation present. No thyromegaly present.  No cervical lymphadenopathy Cardiovascular: Normal rate, regular rhythm and normal heart sounds.  No murmur heard. No carotid bruit .  No edema Pulmonary/Chest: Effort normal and breath sounds normal. No respiratory distress. No has no wheezes. No rales.  Skin: Skin is warm and dry. Not diaphoretic.  Psychiatric: Normal mood and affect. Behavior is normal.      Assessment & Plan:    See Problem List for Assessment and Plan of chronic medical problems.    This visit occurred during the SARS-CoV-2 public health emergency.  Safety protocols were in place, including screening questions prior to the visit, additional usage of staff PPE, and extensive cleaning of exam room while observing  appropriate contact time as indicated for disinfecting solutions.

## 2019-06-11 ENCOUNTER — Encounter: Payer: Self-pay | Admitting: Internal Medicine

## 2019-06-11 ENCOUNTER — Ambulatory Visit (INDEPENDENT_AMBULATORY_CARE_PROVIDER_SITE_OTHER): Payer: Medicare Other | Admitting: Internal Medicine

## 2019-06-11 ENCOUNTER — Other Ambulatory Visit: Payer: Self-pay

## 2019-06-11 VITALS — BP 152/98 | HR 132 | Temp 98.8°F | Resp 16 | Ht 67.0 in | Wt 213.0 lb

## 2019-06-11 DIAGNOSIS — I10 Essential (primary) hypertension: Secondary | ICD-10-CM

## 2019-06-11 DIAGNOSIS — R002 Palpitations: Secondary | ICD-10-CM

## 2019-06-11 DIAGNOSIS — K219 Gastro-esophageal reflux disease without esophagitis: Secondary | ICD-10-CM | POA: Diagnosis not present

## 2019-06-11 DIAGNOSIS — Z13820 Encounter for screening for osteoporosis: Secondary | ICD-10-CM

## 2019-06-11 DIAGNOSIS — M25461 Effusion, right knee: Secondary | ICD-10-CM

## 2019-06-11 DIAGNOSIS — G8929 Other chronic pain: Secondary | ICD-10-CM

## 2019-06-11 DIAGNOSIS — M25561 Pain in right knee: Secondary | ICD-10-CM

## 2019-06-11 DIAGNOSIS — M25511 Pain in right shoulder: Secondary | ICD-10-CM

## 2019-06-11 NOTE — Assessment & Plan Note (Signed)
Chronic Has seen sports medicine and has follow-up soon-we will likely get an injection Has not done official PT-staying active and doing exercises at home Has not tried Voltaren gel-advised her to try the Voltaren gel

## 2019-06-11 NOTE — Patient Instructions (Signed)
A bone density was ordered.   Medications reviewed and updated.  Changes include :   none    Please followup in 6 months

## 2019-06-11 NOTE — Assessment & Plan Note (Signed)
Subacute Still experiencing palpitations-states she can feel these up to 3 times a day Has sometimes associated shortness of breath, lightheadedness and states it feels uncomfortable Heart rate here today in the 130s, but heart rate was variable Wearing Holter monitor and has follow-up with cardiology Possible bradycardia-tachycardia syndrome No adjustment in medication today

## 2019-06-11 NOTE — Assessment & Plan Note (Signed)
Chronic Blood pressure is high here today, but her heart rate is also very elevated which is likely contributing to her elevated blood pressure Will not adjust her medications today She has follow-up with cardiology next month and is currently wearing her Holter monitor Continue current medications

## 2019-06-11 NOTE — Assessment & Plan Note (Signed)
Chronic Has seen sports medicine and has follow-up He is not doing official PT-doing exercises at home and staying active, does some walking Has not tried Voltaren gel and advised her to try it Tramadol did not help and we will no longer continue Tylenol as needed

## 2019-06-11 NOTE — Assessment & Plan Note (Signed)
Chronic GERD controlled Continue daily medication Pantoprazole 40 mg daily 

## 2019-06-14 ENCOUNTER — Other Ambulatory Visit: Payer: Self-pay | Admitting: Internal Medicine

## 2019-06-14 ENCOUNTER — Ambulatory Visit: Payer: Medicare Other | Admitting: Family Medicine

## 2019-06-14 NOTE — Progress Notes (Deleted)
   I, Christoper Fabian, LAT, ATC, am serving as scribe for Dr. Clementeen Graham.  Susan Brady is a 74 y.o. female who presents to [facility] at [facility] today for f/u of R shoulder, R knee and LBP.  She was last seen by Dr. Denyse Amass on 05/13/19 and was provided a HEP for quad strengthening and was referred for home health PT.  Since her last visit, pt reports   Diagnostic testing: R shoulder XR- 05/13/19; R knee XR- 03/29/19; L-spine XR- 12/02/16  Pertinent review of systems: ***  Relevant historical information: ***   Exam:  There were no vitals taken for this visit. General: Well Developed, well nourished, and in no acute distress.   MSK: ***    Lab and Radiology Results No results found for this or any previous visit (from the past 72 hour(s)). No results found.     Assessment and Plan: 74 y.o. female with ***   PDMP not reviewed this encounter. No orders of the defined types were placed in this encounter.  No orders of the defined types were placed in this encounter.    Discussed warning signs or symptoms. Please see discharge instructions. Patient expresses understanding.   ***

## 2019-06-16 ENCOUNTER — Other Ambulatory Visit: Payer: Self-pay

## 2019-06-16 ENCOUNTER — Encounter: Payer: Self-pay | Admitting: Podiatry

## 2019-06-16 ENCOUNTER — Ambulatory Visit (INDEPENDENT_AMBULATORY_CARE_PROVIDER_SITE_OTHER): Payer: Medicare Other | Admitting: Podiatry

## 2019-06-16 VITALS — Temp 98.2°F

## 2019-06-16 DIAGNOSIS — E119 Type 2 diabetes mellitus without complications: Secondary | ICD-10-CM

## 2019-06-16 DIAGNOSIS — M79676 Pain in unspecified toe(s): Secondary | ICD-10-CM

## 2019-06-16 DIAGNOSIS — B351 Tinea unguium: Secondary | ICD-10-CM

## 2019-06-16 NOTE — Patient Instructions (Signed)
Diabetes Mellitus and Foot Care Foot care is an important part of your health, especially when you have diabetes. Diabetes may cause you to have problems because of poor blood flow (circulation) to your feet and legs, which can cause your skin to:  Become thinner and drier.  Break more easily.  Heal more slowly.  Peel and crack. You may also have nerve damage (neuropathy) in your legs and feet, causing decreased feeling in them. This means that you may not notice minor injuries to your feet that could lead to more serious problems. Noticing and addressing any potential problems early is the best way to prevent future foot problems. How to care for your feet Foot hygiene  Wash your feet daily with warm water and mild soap. Do not use hot water. Then, pat your feet and the areas between your toes until they are completely dry. Do not soak your feet as this can dry your skin.  Trim your toenails straight across. Do not dig under them or around the cuticle. File the edges of your nails with an emery board or nail file.  Apply a moisturizing lotion or petroleum jelly to the skin on your feet and to dry, brittle toenails. Use lotion that does not contain alcohol and is unscented. Do not apply lotion between your toes. Shoes and socks  Wear clean socks or stockings every day. Make sure they are not too tight. Do not wear knee-high stockings since they may decrease blood flow to your legs.  Wear shoes that fit properly and have enough cushioning. Always look in your shoes before you put them on to be sure there are no objects inside.  To break in new shoes, wear them for just a few hours a day. This prevents injuries on your feet. Wounds, scrapes, corns, and calluses  Check your feet daily for blisters, cuts, bruises, sores, and redness. If you cannot see the bottom of your feet, use a mirror or ask someone for help.  Do not cut corns or calluses or try to remove them with medicine.  If you  find a minor scrape, cut, or break in the skin on your feet, keep it and the skin around it clean and dry. You may clean these areas with mild soap and water. Do not clean the area with peroxide, alcohol, or iodine.  If you have a wound, scrape, corn, or callus on your foot, look at it several times a day to make sure it is healing and not infected. Check for: ? Redness, swelling, or pain. ? Fluid or blood. ? Warmth. ? Pus or a bad smell. General instructions  Do not cross your legs. This may decrease blood flow to your feet.  Do not use heating pads or hot water bottles on your feet. They may burn your skin. If you have lost feeling in your feet or legs, you may not know this is happening until it is too late.  Protect your feet from hot and cold by wearing shoes, such as at the beach or on hot pavement.  Schedule a complete foot exam at least once a year (annually) or more often if you have foot problems. If you have foot problems, report any cuts, sores, or bruises to your health care provider immediately. Contact a health care provider if:  You have a medical condition that increases your risk of infection and you have any cuts, sores, or bruises on your feet.  You have an injury that is not   healing.  You have redness on your legs or feet.  You feel burning or tingling in your legs or feet.  You have pain or cramps in your legs and feet.  Your legs or feet are numb.  Your feet always feel cold.  You have pain around a toenail. Get help right away if:  You have a wound, scrape, corn, or callus on your foot and: ? You have pain, swelling, or redness that gets worse. ? You have fluid or blood coming from the wound, scrape, corn, or callus. ? Your wound, scrape, corn, or callus feels warm to the touch. ? You have pus or a bad smell coming from the wound, scrape, corn, or callus. ? You have a fever. ? You have a red line going up your leg. Summary  Check your feet every day  for cuts, sores, red spots, swelling, and blisters.  Moisturize feet and legs daily.  Wear shoes that fit properly and have enough cushioning.  If you have foot problems, report any cuts, sores, or bruises to your health care provider immediately.  Schedule a complete foot exam at least once a year (annually) or more often if you have foot problems. This information is not intended to replace advice given to you by your health care provider. Make sure you discuss any questions you have with your health care provider. Document Revised: 10/28/2018 Document Reviewed: 03/08/2016 Elsevier Patient Education  2020 Elsevier Inc.  Peripheral Neuropathy Peripheral neuropathy is a type of nerve damage. It affects nerves that carry signals between the spinal cord and the arms, legs, and the rest of the body (peripheral nerves). It does not affect nerves in the spinal cord or brain. In peripheral neuropathy, one nerve or a group of nerves may be damaged. Peripheral neuropathy is a broad category that includes many specific nerve disorders, like diabetic neuropathy, hereditary neuropathy, and carpal tunnel syndrome. What are the causes? This condition may be caused by:  Diabetes. This is the most common cause of peripheral neuropathy.  Nerve injury.  Pressure or stress on a nerve that lasts a long time.  Lack (deficiency) of B vitamins. This can result from alcoholism, poor diet, or a restricted diet.  Infections.  Autoimmune diseases, such as rheumatoid arthritis and systemic lupus erythematosus.  Nerve diseases that are passed from parent to child (inherited).  Some medicines, such as cancer medicines (chemotherapy).  Poisonous (toxic) substances, such as lead and mercury.  Too little blood flowing to the legs.  Kidney disease.  Thyroid disease. In some cases, the cause of this condition is not known. What are the signs or symptoms? Symptoms of this condition depend on which of your  nerves is damaged. Common symptoms include:  Loss of feeling (numbness) in the feet, hands, or both.  Tingling in the feet, hands, or both.  Burning pain.  Very sensitive skin.  Weakness.  Not being able to move a part of the body (paralysis).  Muscle twitching.  Clumsiness or poor coordination.  Loss of balance.  Not being able to control your bladder.  Feeling dizzy.  Sexual problems. How is this diagnosed? Diagnosing and finding the cause of peripheral neuropathy can be difficult. Your health care provider will take your medical history and do a physical exam. A neurological exam will also be done. This involves checking things that are affected by your brain, spinal cord, and nerves (nervous system). For example, your health care provider will check your reflexes, how you move, and   what you can feel. You may have other tests, such as:  Blood tests.  Electromyogram (EMG) and nerve conduction tests. These tests check nerve function and how well the nerves are controlling the muscles.  Imaging tests, such as CT scans or MRI to rule out other causes of your symptoms.  Removing a small piece of nerve to be examined in a lab (nerve biopsy). This is rare.  Removing and examining a small amount of the fluid that surrounds the brain and spinal cord (lumbar puncture). This is rare. How is this treated? Treatment for this condition may involve:  Treating the underlying cause of the neuropathy, such as diabetes, kidney disease, or vitamin deficiencies.  Stopping medicines that can cause neuropathy, such as chemotherapy.  Medicine to relieve pain. Medicines may include: ? Prescription or over-the-counter pain medicine. ? Antiseizure medicine. ? Antidepressants. ? Pain-relieving patches that are applied to painful areas of skin.  Surgery to relieve pressure on a nerve or to destroy a nerve that is causing pain.  Physical therapy to help improve movement and  balance.  Devices to help you move around (assistive devices). Follow these instructions at home: Medicines  Take over-the-counter and prescription medicines only as told by your health care provider. Do not take any other medicines without first asking your health care provider.  Do not drive or use heavy machinery while taking prescription pain medicine. Lifestyle   Do not use any products that contain nicotine or tobacco, such as cigarettes and e-cigarettes. Smoking keeps blood from reaching damaged nerves. If you need help quitting, ask your health care provider.  Avoid or limit alcohol. Too much alcohol can cause a vitamin B deficiency, and vitamin B is needed for healthy nerves.  Eat a healthy diet. This includes: ? Eating foods that are high in fiber, such as fresh fruits and vegetables, whole grains, and beans. ? Limiting foods that are high in fat and processed sugars, such as fried or sweet foods. General instructions   If you have diabetes, work closely with your health care provider to keep your blood sugar under control.  If you have numbness in your feet: ? Check every day for signs of injury or infection. Watch for redness, warmth, and swelling. ? Wear padded socks and comfortable shoes. These help protect your feet.  Develop a good support system. Living with peripheral neuropathy can be stressful. Consider talking with a mental health specialist or joining a support group.  Use assistive devices and attend physical therapy as told by your health care provider. This may include using a walker or a cane.  Keep all follow-up visits as told by your health care provider. This is important. Contact a health care provider if:  You have new signs or symptoms of peripheral neuropathy.  You are struggling emotionally from dealing with peripheral neuropathy.  Your pain is not well-controlled. Get help right away if:  You have an injury or infection that is not healing  normally.  You develop new weakness in an arm or leg.  You fall frequently. Summary  Peripheral neuropathy is when the nerves in the arms, or legs are damaged, resulting in numbness, weakness, or pain.  There are many causes of peripheral neuropathy, including diabetes, pinched nerves, vitamin deficiencies, autoimmune disease, and hereditary conditions.  Diagnosing and finding the cause of peripheral neuropathy can be difficult. Your health care provider will take your medical history, do a physical exam, and do tests, including blood tests and nerve function tests.    Treatment involves treating the underlying cause of the neuropathy and taking medicines to help control pain. Physical therapy and assistive devices may also help. This information is not intended to replace advice given to you by your health care provider. Make sure you discuss any questions you have with your health care provider. Document Revised: 01/17/2017 Document Reviewed: 04/15/2016 Elsevier Patient Education  2020 Elsevier Inc.  

## 2019-06-21 NOTE — Progress Notes (Signed)
Subjective: Susan Brady presents today for follow up of preventative diabetic foot care and painful mycotic nails b/l that are difficult to trim. Pain interferes with ambulation. Aggravating factors include wearing enclosed shoe gear. Pain is relieved with periodic professional debridement.   Allergies  Allergen Reactions  . Lisinopril Anaphylaxis    cough     Objective: Vitals:   06/16/19 1305  Temp: 98.2 F (36.8 C)    Pt 74 y.o. year old female  in NAD. AAO x 3.   Vascular Examination:  Capillary refill time to digits immediate b/l. Palpable DP pulses b/l. Palpable PT pulses b/l. Pedal hair present b/l. Skin temperature gradient within normal limits b/l. No edema noted b/l.  Dermatological Examination: Pedal skin with normal turgor, texture and tone bilaterally. No open wounds bilaterally. No interdigital macerations bilaterally. Toenails 1-5 b/l elongated, dystrophic, thickened, crumbly with subungual debris and tenderness to dorsal palpation.  Musculoskeletal: Normal muscle strength 5/5 to all lower extremity muscle groups bilaterally. No gross bony deformities bilaterally. No pain crepitus or joint limitation noted with ROM b/l.  Neurological: Protective sensation intact 5/5 intact bilaterally with 10g monofilament b/l. Vibratory sensation intact b/l. Proprioception intact bilaterally. Babinski reflex negative b/l. Clonus negative b/l.  Assessment: 1. Pain due to onychomycosis of toenail   2. Non-insulin dependent type 2 diabetes mellitus (HCC)    Plan: -No new findings. No new orders. -Continue diabetic foot care principles. Literature dispensed on today.  -Toenails 1-5 b/l were debrided in length and girth with sterile nail nippers and dremel without iatrogenic bleeding.  -Patient to continue soft, supportive shoe gear daily. -Patient to report any pedal injuries to medical professional immediately. -Patient/POA to call should there be question/concern in the  interim.  Return in about 3 months (around 09/15/2019) for diabetic nail trim.  Freddie Breech, DPM

## 2019-07-01 DIAGNOSIS — R002 Palpitations: Secondary | ICD-10-CM | POA: Diagnosis not present

## 2019-07-02 ENCOUNTER — Ambulatory Visit: Payer: Medicare Other | Admitting: Cardiology

## 2019-07-06 ENCOUNTER — Other Ambulatory Visit: Payer: Self-pay

## 2019-07-06 MED ORDER — HYDRALAZINE HCL 50 MG PO TABS: ORAL_TABLET | ORAL | 1 refills | Status: DC

## 2019-07-12 NOTE — Progress Notes (Signed)
Cardiology Office Note   Date:  07/13/2019   ID:  Susan Brady, DOB [birth year], MRN [id number]  PCP:  Pincus Sanes, MD  Cardiologist:   Rollene Rotunda, MD    Chief Complaint  Patient presents with  . Palpitations      History of Present Illness: Susan Brady is a 74 y.o. female who presents for follow up of chest pain.  She did have an abnormal Lexiscan Myoview.  However she had normal coronaries on cath.  She did have cardiomegaly on CXR but had an echo after the last office appt with NL LV function.  There was grade two diastolic dysfunction.   She was having palpitations at the last appt.  I ordered an event monitor and it demonstrated frequent sustained SVT.     I brought her and her daughter back today to discuss this.  She is feeling the palpitations frequently throughout the day.  They last for a few hours at a time.  They are as previously described.  She will feel rapid heartbeat.  She has not had any presyncope or syncope.  She has had no new chest pressure, neck or arm discomfort.  She denies any shortness of breath, PND or orthopnea.  He   Past Medical History:  Diagnosis Date  . Anemia   . CHF (congestive heart failure) (HCC)   . Diabetes mellitus without complication (HCC)   . Gastric ulcer   . Glaucoma   . Hiatal hernia   . Hyperlipidemia   . Hypertension     Past Surgical History:  Procedure Laterality Date  . LEFT HEART CATH AND CORONARY ANGIOGRAPHY N/A 07/25/2016   Procedure: Left Heart Cath and Coronary Angiography;  Surgeon: Kathleene Hazel, MD;  Location: [facility] INVASIVE CV LAB;  Service: Cardiovascular;  Laterality: N/A;     Current Outpatient Medications  Medication Sig Dispense Refill  . acetaminophen (TYLENOL) 500 MG tablet Take 500-1,000 mg by mouth every 6 (six) hours as needed for mild pain.    Marland Kitchen aspirin EC 81 MG tablet Take 1 tablet (81 mg total) by mouth daily.    Marland Kitchen atorvastatin (LIPITOR) 20 MG tablet TAKE 1 TABLET BY MOUTH  DAILY...OFFICE VISIT FOR FURTHER REFILLS 90 tablet 1  . augmented betamethasone dipropionate (DIPROLENE-AF) 0.05 % cream     . cholecalciferol (VITAMIN D) 1000 UNITS tablet Take 1,000 Units by mouth daily.     . furosemide (LASIX) 20 MG tablet TAKE 1 TABLET(20 MG) BY MOUTH TWICE DAILY 180 tablet 1  . Garlic 1000 MG CAPS Take 1,000 mg by mouth daily.    . hydrALAZINE (APRESOLINE) 50 MG tablet TAKE 1&1/2 TABLETS BY MOUTH THREE TIMES DAILY. NEED FOLLOW UP FROM MORE REFILLS 405 tablet 1  . metoprolol tartrate (LOPRESSOR) 100 MG tablet Take 1 tablet (100 mg total) by mouth 2 (two) times daily. 180 tablet 3  . omega-3 acid ethyl esters (LOVAZA) 1 G capsule Take 1 g by mouth daily.     . pantoprazole (PROTONIX) 40 MG tablet TAKE 1 TABLET(40 MG) BY MOUTH DAILY 90 tablet 1  . potassium chloride SA (KLOR-CON) 20 MEQ tablet TAKE 1 TABLET(20 MEQ) BY MOUTH DAILY 90 tablet 1  . triamcinolone cream (KENALOG) 0.1 % Apply 1 application topically 2 (two) times daily. 30 g 0  . vitamin E 400 UNIT capsule Take 400 Units by mouth daily.     No current facility-administered medications for this visit.    Allergies:   Lisinopril  ROS:  Please see the history of present illness.   Otherwise, review of systems are positive for none.   All other systems are reviewed and negative.    PHYSICAL EXAM: VS:  BP 132/72   Pulse 88   Ht 5\' 7"  (1.702 m)   Wt 221 lb (100.2 kg)   SpO2 94%   BMI 34.61 kg/m  , BMI Body mass index is 34.61 kg/m. GENERAL:  Well appearing NECK:  No jugular venous distention, waveform within normal limits, carotid upstroke brisk and symmetric, no bruits, no thyromegaly LUNGS:  Clear to auscultation bilaterally CHEST:  Unremarkable HEART:  PMI not displaced or sustained,S1 and S2 within normal limits, no S3, no S4, no clicks, no rubs, no murmurs ABD:  Flat, positive bowel sounds normal in frequency in pitch, no bruits, no rebound, no guarding, no midline pulsatile mass, no hepatomegaly, no  splenomegaly EXT:  2 plus pulses throughout, no edema, no cyanosis no clubbing   EKG:  EKG is  ordered today. SVT, rate 129, low voltage throughout, poor anterior R wave progression, no acute ST-T wave changes.   Recent Labs: 03/03/2019: ALT 9; BUN 12; Creatinine, Ser 0.72; Hemoglobin 11.6; Platelets 314.0; Potassium 3.8; Sodium 141 06/02/2019: TSH 0.479    Lipid Panel    Component Value Date/Time   CHOL 128 03/03/2019 1343   TRIG 96.0 03/03/2019 1343   HDL 60.10 03/03/2019 1343   CHOLHDL 2 03/03/2019 1343   VLDL 19.2 03/03/2019 1343   LDLCALC 49 03/03/2019 1343      Wt Readings from Last 3 Encounters:  07/13/19 221 lb (100.2 kg)  06/11/19 213 lb (96.6 kg)  06/02/19 213 lb 9.6 oz (96.9 kg)      Other studies Reviewed: Additional studies/ records that were reviewed today include: Monitor .  (Greater 40 minutes with this visit more than half the time with the patient) Review of the above records demonstrates:  Please see elsewhere in the note.   ASSESSMENT AND PLAN:   SVT She has an SVT that is frequent and recurrent.  It did not break with vagal maneuvers in the office but it eventually did break.  I am going to increase her metoprolol to 75 mg twice daily.  She would be interested in EP consultation for possible definitive therapy.  I will arrange this.  Of note electrolytes and TSH were unremarkable.   She does have some bradycardia and I will make sure that she knows to call us if she has any symptoms related to this.  There were no sustained pauses when she had a monitor.  Essential hypertension The blood pressure is well controlled and she should be able to tolerate the increase beta-blocker.   Cardiomegaly Echo with NL EF.  She had no evidence of LVH previously.  No further work-up.   Covid education She has had her vaccines.  Current medicines are reviewed at length with the patient today.  The patient does not have concerns regarding medicines.  The  following changes have been made:  None  Labs/ tests ordered today include:    Orders Placed This Encounter  Procedures  . Ambulatory referral to Cardiac Electrophysiology     Disposition:   FU with EP.     Signed, Minus Breeding, MD  07/13/2019 5:55 PM    [place]

## 2019-07-13 ENCOUNTER — Other Ambulatory Visit: Payer: Self-pay

## 2019-07-13 ENCOUNTER — Ambulatory Visit (INDEPENDENT_AMBULATORY_CARE_PROVIDER_SITE_OTHER): Payer: Medicare Other | Admitting: Cardiology

## 2019-07-13 ENCOUNTER — Encounter: Payer: Self-pay | Admitting: Cardiology

## 2019-07-13 VITALS — BP 132/72 | HR 88 | Ht 67.0 in | Wt 221.0 lb

## 2019-07-13 DIAGNOSIS — I471 Supraventricular tachycardia: Secondary | ICD-10-CM

## 2019-07-13 DIAGNOSIS — I10 Essential (primary) hypertension: Secondary | ICD-10-CM

## 2019-07-13 MED ORDER — METOPROLOL TARTRATE 100 MG PO TABS: 100.0000 mg | ORAL_TABLET | Freq: Two times a day (BID) | ORAL | 3 refills | Status: DC

## 2019-07-13 NOTE — Patient Instructions (Addendum)
Medication Instructions:  INCREASE METOPROLOL TO 100MG  TWICE A DAY *If you need a refill on your cardiac medications before your next appointment, please call your pharmacy*  Lab Work: NONE ORDERED THIS VISIT  Testing/Procedures: NONE ORDERED THIS VISIT  Follow-Up: At [facility], you and your health needs are our priority.  As part of our continuing mission to provide you with exceptional heart care, we have created designated Provider Care Teams.  These Care Teams include your primary Cardiologist (physician) and Advanced Practice Providers (APPs -  Physician Assistants and Nurse Practitioners) who all work together to provide you with the care you need, when you need it.  We recommend signing up for the patient portal called "MyChart".  Sign up information is provided on this After Visit Summary.  MyChart is used to connect with patients for Virtual Visits (Telemedicine).  Patients are able to view lab/test results, encounter notes, upcoming appointments, etc.  Non-urgent messages can be sent to your provider as well.   To learn more about what you can do with MyChart, go to [facility].    Your next appointment:   1 month(s)  The format for your next appointment:   In Person  Provider:   [email or web address], MD  Other Instructions REFERRAL TO EP - DR. Rollene Rotunda

## 2019-07-14 ENCOUNTER — Ambulatory Visit (INDEPENDENT_AMBULATORY_CARE_PROVIDER_SITE_OTHER)
Admission: RE | Admit: 2019-07-14 | Discharge: 2019-07-14 | Disposition: A | Discharge status: Home / Self Care | Payer: Medicare Other | Source: Ambulatory Visit | Attending: Internal Medicine | Admitting: Internal Medicine

## 2019-07-14 DIAGNOSIS — Z13820 Encounter for screening for osteoporosis: Secondary | ICD-10-CM

## 2019-07-16 ENCOUNTER — Other Ambulatory Visit: Payer: Self-pay

## 2019-07-16 ENCOUNTER — Encounter: Payer: Self-pay | Admitting: Internal Medicine

## 2019-07-16 ENCOUNTER — Ambulatory Visit (INDEPENDENT_AMBULATORY_CARE_PROVIDER_SITE_OTHER): Payer: Medicare Other | Admitting: Internal Medicine

## 2019-07-16 VITALS — BP 110/72 | HR 123 | Ht 67.0 in | Wt 212.0 lb

## 2019-07-16 DIAGNOSIS — G4730 Sleep apnea, unspecified: Secondary | ICD-10-CM

## 2019-07-16 DIAGNOSIS — I471 Supraventricular tachycardia, unspecified: Secondary | ICD-10-CM

## 2019-07-16 MED ORDER — FLECAINIDE ACETATE 50 MG PO TABS
50.0000 mg | ORAL_TABLET | Freq: Two times a day (BID) | ORAL | 3 refills | Status: DC
Start: 2019-07-16 — End: 2019-08-13

## 2019-07-16 NOTE — Patient Instructions (Signed)
Medication Instructions:  Your physician has recommended you make the following change in your medication:   ** Begin taking Flecainide 50mg  - 1 tablet by mouth twice daily.  *If you need a refill on your cardiac medications before your next appointment, please call your pharmacy*   Lab Work: None ordered.  If you have labs (blood work) drawn today and your tests are completely normal, you will receive your results only by: MyChart Message (if you have MyChart) OR . A paper copy in the mail If you have any lab test that is abnormal or we need to change your treatment, we will call you to review the results.   Testing/Procedures: None ordered.    Follow-Up: At [facility], you and your health needs are our priority.  As part of our continuing mission to provide you with exceptional heart care, we have created designated Provider Care Teams.  These Care Teams include your primary Cardiologist (physician) and Advanced Practice Providers (APPs -  Physician Assistants and Nurse Practitioners) who all work together to provide you with the care you need, when you need it.  We recommend signing up for the patient portal called "MyChart".  Sign up information is provided on this After Visit Summary.  MyChart is used to connect with patients for Virtual Visits (Telemedicine).  Patients are able to view lab/test results, encounter notes, upcoming appointments, etc.  Non-urgent messages can be sent to your provider as well.   To learn more about what you can do with MyChart, go to [facility].    Your next appointment:   4 month(s)  The format for your next appointment:   In Person  Provider:   [email or web address], MD

## 2019-07-16 NOTE — Progress Notes (Signed)
ELECTROPHYSIOLOGY CONSULT NOTE  Patient ID: Susan Brady, MRN: [id number], DOB/AGE: [birth year] 74 y.o. Admit date: (Not on file) Date of Consult: 07/16/2019  Primary Physician: Pincus Sanes, MD Primary Cardiologist: [facility]     Susan Brady is a 74 y.o. female who is being seen today for the evaluation of tachycardia at the request of [facility].    HPI Susan Brady is a 74 y.o. female woman referred with a 5 to 8-month history of progressively frequent tachypalpitations.  No specific triggers are noted, specifically no exercise or caffeine or stress.  These occur at rest and with exertion multiple times a day.  Assoc with LH and SOB   DATE TEST EF   6/18 LHC   No  Obstructive CAD   7/18 Echo  60-65 %         Date Cr K Hgb  1/21 0.72 3.8 11.6<<12.7  Pressure dressing      Event Recorder personnally reviewed  Numerous episodes of SVT  They all begin with a PAC.  There is some variability in heart rate.   Past Medical History:  Diagnosis Date  . Anemia   . CHF (congestive heart failure) (HCC)   . Diabetes mellitus without complication (HCC)   . Gastric ulcer   . Glaucoma   . Hiatal hernia   . Hyperlipidemia   . Hypertension       Surgical History:  Past Surgical History:  Procedure Laterality Date  . LEFT HEART CATH AND CORONARY ANGIOGRAPHY N/A 07/25/2016   Procedure: Left Heart Cath and Coronary Angiography;  Surgeon: Kathleene Hazel, MD;  Location: [facility] INVASIVE CV LAB;  Service: Cardiovascular;  Laterality: N/A;     Home Meds: Current Meds  Medication Sig  . acetaminophen (TYLENOL) 500 MG tablet Take 500-1,000 mg by mouth every 6 (six) hours as needed for mild pain.  Marland Kitchen aspirin EC 81 MG tablet Take 1 tablet (81 mg total) by mouth daily.  Marland Kitchen atorvastatin (LIPITOR) 20 MG tablet TAKE 1 TABLET BY MOUTH DAILY...OFFICE VISIT FOR FURTHER REFILLS  . augmented betamethasone dipropionate (DIPROLENE-AF) 0.05 % cream   . cholecalciferol (VITAMIN D) 1000 UNITS tablet Take  1,000 Units by mouth daily.   . furosemide (LASIX) 20 MG tablet TAKE 1 TABLET(20 MG) BY MOUTH TWICE DAILY  . Garlic 1000 MG CAPS Take 1,000 mg by mouth daily.  . hydrALAZINE (APRESOLINE) 50 MG tablet TAKE 1&1/2 TABLETS BY MOUTH THREE TIMES DAILY. NEED FOLLOW UP FROM MORE REFILLS  . metoprolol tartrate (LOPRESSOR) 100 MG tablet Take 1 tablet (100 mg total) by mouth 2 (two) times daily.  Marland Kitchen omega-3 acid ethyl esters (LOVAZA) 1 G capsule Take 1 g by mouth daily.   . pantoprazole (PROTONIX) 40 MG tablet TAKE 1 TABLET(40 MG) BY MOUTH DAILY  . potassium chloride SA (KLOR-CON) 20 MEQ tablet TAKE 1 TABLET(20 MEQ) BY MOUTH DAILY  . triamcinolone cream (KENALOG) 0.1 % Apply 1 application topically 2 (two) times daily.  . vitamin E 400 UNIT capsule Take 400 Units by mouth daily.    Allergies:  Allergies  Allergen Reactions  . Lisinopril Anaphylaxis    cough    Social History   Socioeconomic History  . Marital status: Divorced    Spouse name: Not on file  . Number of children: Not on file  . Years of education: Not on file  . Highest education level: Not on file  Occupational History  . Not on file  Tobacco Use  .  Smoking status: Former Research scientist (life sciences)  . Smokeless tobacco: Never Used  Substance and Sexual Activity  . Alcohol use: No    Alcohol/week: 0.0 standard drinks  . Drug use: No  . Sexual activity: Not on file  Other Topics Concern  . Not on file  Social History Narrative  . Not on file   Social Determinants of Health   Financial Resource Strain:   . Difficulty of Paying Living Expenses:   Food Insecurity:   . Worried About Charity fundraiser in the Last Year:   . Arboriculturist in the Last Year:   Transportation Needs:   . Film/video editor (Medical):   Marland Kitchen Lack of Transportation (Non-Medical):   Physical Activity:   . Days of Exercise per Week:   . Minutes of Exercise per Session:   Stress:   . Feeling of Stress :   Social Connections:   . Frequency of Communication  with Friends and Family:   . Frequency of Social Gatherings with Friends and Family:   . Attends Religious Services:   . Active Member of Clubs or Organizations:   . Attends Archivist Meetings:   Marland Kitchen Marital Status:   Intimate Partner Violence:   . Fear of Current or Ex-Partner:   . Emotionally Abused:   Marland Kitchen Physically Abused:   . Sexually Abused:      Family History  Problem Relation Age of Onset  . Heart disease Mother   . Alcohol abuse Father   . Alcohol abuse Sister   . Diabetes Sister   . Hypertension Sister   . Cancer Sister   . CAD Sister        One sister with stents  . Alcohol abuse Brother   . Diabetes Brother   . Hypertension Brother      ROS:  Please see the history of present illness.     All other systems reviewed and negative.    Physical Exam:  Blood pressure 110/72, pulse (!) 123, height 5\' 7"  (1.702 m), weight 212 lb (96.2 kg), SpO2 94 %. General: Well developed, well nourished female in no acute distress. Head: Normocephalic, atraumatic, sclera non-icteric, no xanthomas, nares are without discharge. EENT: normal  Lymph Nodes:  none Neck: Negative for carotid bruits. JVD 8-10 Back:without scoliosis kyphosis  Lungs: Clear bilaterally to auscultation without wheezes, rales, or rhonchi. Breathing is unlabored. Heart: RRR with S1 S2. 2/72murmur . No rubs, or gallops appreciated. Abdomen: Soft, non-tender, non-distended with normoactive bowel sounds. No hepatomegaly. No rebound/guarding. No obvious abdominal masses. Msk:  Strength and tone appear normal for age. Extremities: No clubbing or cyanosis. No*  edema.  Distal pedal pulses are 2+ and equal bilaterally. Skin: Warm and Dry Neuro: Alert and oriented X 3. CN III-XII intact Grossly normal sensory and motor function . Psych:  Responds to questions appropriately with a normal affect.      Labs: Cardiac Enzymes No results for input(s): CKTOTAL, CKMB, TROPONINI in the last 72 hours. CBC Lab  Results  Component Value Date   WBC 6.0 03/03/2019   HGB 11.6 (L) 03/03/2019   HCT 36.5 03/03/2019   MCV 89.3 03/03/2019   PLT 314.0 03/03/2019   PROTIME: No results for input(s): LABPROT, INR in the last 72 hours. Chemistry No results for input(s): NA, K, CL, CO2, BUN, CREATININE, CALCIUM, PROT, BILITOT, ALKPHOS, ALT, AST, GLUCOSE in the last 168 hours.  Invalid input(s): LABALBU Lipids Lab Results  Component Value Date   CHOL  128 03/03/2019   HDL 60.10 03/03/2019   LDLCALC 49 03/03/2019   TRIG 96.0 03/03/2019   BNP Pro B Natriuretic peptide (BNP)  Date/Time Value Ref Range Status  11/26/2012 01:51 PM 54.1 0 - 125 pg/mL Final   Thyroid Function Tests: No results for input(s): TSH, T4TOTAL, T3FREE, THYROIDAB in the last 72 hours.  Invalid input(s): FREET3 Miscellaneous No results found for: DDIMER  Radiology/Studies:  DG Bone Density  Result Date: 07/15/2019 Date of study: 07/14/2019 Exam: DUAL X-RAY ABSORPTIOMETRY (DXA) FOR BONE MINERAL DENSITY (BMD) Instrument: [employer] Requesting Provider: PCP Indication: screening for osteoporosis Comparison: none (please note that it is not possible to compare data from different instruments) Clinical data: Pt is a 74 y.o. female without previous fractures. On vitamin D. Results:  Lumbar spine L1-L4 Femoral neck (FN) 33% distal radius T-score +3.1 RFN: +0.4 LFN: +0.1 n/a Assessment: the BMD is normal according to the [facility] classification for osteoporosis (see below). Fracture risk: low FRAX score: not calculated due to normal BMD Comments: the technical quality of the study is good Recommend optimizing calcium (1200 mg/day) and vitamin D (800 IU/day) intake. No pharmacological treatment is indicated. Followup: Repeat BMD is appropriate after 2 years. WHO criteria for diagnosis of osteoporosis in postmenopausal women and in men 55 y/o or older: - normal: T-score -1.0 to + 1.0 - osteopenia/low bone density: T-score between -2.5 and -1.0  - osteoporosis: T-score below -2.5 - severe osteoporosis: T-score below -2.5 with history of fragility fracture Note: although not part of the WHO classification, the presence of a fragility fracture, regardless of the T-score, should be considered diagnostic of osteoporosis, provided other causes for the fracture have been excluded. Carlus Pavlov, MD [facility]  LONG TERM MONITOR (3-14 DAYS)  Result Date: 07/02/2019 Predominant rhythm is NSR. SVT was frequent and at times sustained.     EKG: atrial tach 123 Pwave morphology    Assessment and Plan:  Atrial tachycardia  Sinus tachycardia   The patient has recurrent infrequent episodes of nonsustained atrial tachycardia.  I spent a long time stretching my head looking at the event recorder onset.  The episodes initiated with a PAC and terminated with a PAC I conducted to the V.  I was pretty sure with an atrial tachycardia that she comes in and her twelve-lead is supportive of an atrial tachycardia.  Not evidence of flutter.  Hence, we will begin her on flecainide.  We have reviewed side effects.  We will start her on 50 mg twice daily and increase it as tolerated and as needed.  We discussed the possibility of catheter ablation.  Both she and her daughter are disinclined towards that approach.  I would reach out to Dr. [facility] to see whether he would like me to follow her or whether he would like to do so.    Sherryl Manges

## 2019-07-20 ENCOUNTER — Telehealth: Payer: Self-pay | Admitting: *Deleted

## 2019-07-20 NOTE — Telephone Encounter (Signed)
-----   Message from Alois Cliche, RN sent at 07/16/2019  4:40 PM EDT ----- Regarding: sleep study per SK Please precert and schedule pt for sleep study  Thank you,  Mindi Junker

## 2019-07-21 ENCOUNTER — Other Ambulatory Visit (INDEPENDENT_AMBULATORY_CARE_PROVIDER_SITE_OTHER): Payer: Medicare Other

## 2019-07-21 DIAGNOSIS — I10 Essential (primary) hypertension: Secondary | ICD-10-CM | POA: Diagnosis not present

## 2019-07-30 ENCOUNTER — Other Ambulatory Visit (HOSPITAL_COMMUNITY)
Admission: RE | Admit: 2019-07-30 | Discharge: 2019-07-30 | Disposition: A | Discharge status: Home / Self Care | Payer: Medicare Other | Source: Ambulatory Visit | Attending: Cardiovascular Disease | Admitting: Cardiovascular Disease

## 2019-07-30 DIAGNOSIS — Z01812 Encounter for preprocedural laboratory examination: Secondary | ICD-10-CM | POA: Insufficient documentation

## 2019-07-30 DIAGNOSIS — Z20822 Contact with and (suspected) exposure to covid-19: Secondary | ICD-10-CM | POA: Insufficient documentation

## 2019-07-30 LAB — SARS CORONAVIRUS 2 (TAT 6-24 HRS): SARS Coronavirus 2: NEGATIVE

## 2019-08-01 ENCOUNTER — Other Ambulatory Visit: Payer: Self-pay

## 2019-08-01 ENCOUNTER — Ambulatory Visit (HOSPITAL_BASED_OUTPATIENT_CLINIC_OR_DEPARTMENT_OTHER): Payer: Medicare Other | Attending: Internal Medicine | Admitting: Cardiology

## 2019-08-01 DIAGNOSIS — I493 Ventricular premature depolarization: Secondary | ICD-10-CM | POA: Diagnosis not present

## 2019-08-01 DIAGNOSIS — I471 Supraventricular tachycardia: Secondary | ICD-10-CM | POA: Diagnosis not present

## 2019-08-01 DIAGNOSIS — G4733 Obstructive sleep apnea (adult) (pediatric): Secondary | ICD-10-CM | POA: Insufficient documentation

## 2019-08-01 DIAGNOSIS — Z79899 Other long term (current) drug therapy: Secondary | ICD-10-CM | POA: Insufficient documentation

## 2019-08-01 DIAGNOSIS — R0902 Hypoxemia: Secondary | ICD-10-CM | POA: Diagnosis not present

## 2019-08-01 DIAGNOSIS — G4730 Sleep apnea, unspecified: Secondary | ICD-10-CM

## 2019-08-02 ENCOUNTER — Telehealth: Payer: Self-pay | Admitting: Internal Medicine

## 2019-08-02 NOTE — Progress Notes (Signed)
°  Chronic Care Management   Outreach Note  08/02/2019 Name: Lyah Millirons MRN: [id number] DOB: January 05, 1946  Referred by: Pincus Sanes, MD Reason for referral : No chief complaint on file.   An unsuccessful telephone outreach was attempted today. The patient was referred to the pharmacist for assistance with care management and care coordination. This note is not being shared with the patient for the following reason: To respect privacy (The patient or proxy has requested that the information not be shared).  Follow Up Plan:   Lynnae January [employer] Scheduler

## 2019-08-02 NOTE — Procedures (Signed)
  Patient Name: Novella, Abraha Date:08/01/2019 Gender: Female D.O.B: 04/30/1945 Age (years): 73 Referring Provider: Sherryl Manges Height (inches): 68 Interpreting Physician: Armanda Magic MD, ABSM Weight (lbs): 215 RPSGT: Rolene Arbour BMI: 33 MRN: [id number] Neck Size: 17.00  CLINICAL INFORMATION Sleep Study Type: NPSG  Indication for sleep study: N/A  Epworth Sleepiness Score: 12  SLEEP STUDY TECHNIQUE As per the AASM Manual for the Scoring of Sleep and Associated Events v2.3 (April 2016) with a hypopnea requiring 4% desaturations.  The channels recorded and monitored were frontal, central and occipital EEG, electrooculogram (EOG), submentalis EMG (chin), nasal and oral airflow, thoracic and abdominal wall motion, anterior tibialis EMG, snore microphone, electrocardiogram, and pulse oximetry.  MEDICATIONS Medications self-administered by patient taken the night of the study : ATORVASTATIN, OMEGA-3 ACID ETHYL, METOPROLOL TARTRATE, LASIX, HYDRALAZINE  SLEEP ARCHITECTURE The study was initiated at 10:00:44 PM and ended at 4:30:33 AM.  Sleep onset time was 26.9 minutes and the sleep efficiency was 59.8%. The total sleep time was 233 minutes.  Stage REM latency was 117.0 minutes.  The patient spent 3.6% of the night in stage N1 sleep, 77.3% in stage N2 sleep, 0.0% in stage N3 and 19.1% in REM.  Alpha intrusion was absent.  Supine sleep was 16.12%.  RESPIRATORY PARAMETERS The overall apnea/hypopnea index (AHI) was 10.0 per hour. There were 1 total apneas, including 1 obstructive, 0 central and 0 mixed apneas. There were 38 hypopneas and 5 RERAs.  The AHI during Stage REM sleep was 49.9 per hour.  AHI while supine was 14.4 per hour.  The mean oxygen saturation was 92.9%. The minimum SpO2 during sleep was 80.0%.  soft snoring was noted during this study.  CARDIAC DATA The 2 lead EKG demonstrated sinus rhythm. The mean heart rate was 51.1 beats per minute. Other  EKG findings include: PACs, PVCs and frequent runs of SVT likely atrial tachycardia.   LEG MOVEMENT DATA The total PLMS were 0 with a resulting PLMS index of 0.0. Associated arousal with leg movement index was 1.8 .  IMPRESSIONS - Mild obstructive sleep apnea occurred during this study (AHI = 10.0/h). - No significant central sleep apnea occurred during this study (CAI = 0.0/h). - Moderate oxygen desaturation was noted during this study (Min O2 = 80.0%). - The patient snored with soft snoring volume. - PACs, PVCs and frequent runs of SVT likely atrial tachycardia. were noted during this study. - Clinically significant periodic limb movements did not occur during sleep. No significant associated arousals.  DIAGNOSIS - Obstructive Sleep Apnea (327.23 [G47.33 ICD-10]) - Nocturnal Hypoxemia (327.26 [G47.36 ICD-10]) - PACs, PVCs and frequent runs of SVT likely atrial tachycardia.   RECOMMENDATIONS - Therapeutic CPAP titration to determine optimal pressure required to alleviate sleep disordered breathing. - Avoid alcohol, sedatives and other CNS depressants that may worsen sleep apnea and disrupt normal sleep architecture. - Sleep hygiene should be reviewed to assess factors that may improve sleep quality. - Weight management and regular exercise should be initiated or continued if appropriate.  [Electronically signed] 08/02/2019 02:04 PM  Armanda Magic MD, ABSM Diplomate, American Board of Sleep Medicine

## 2019-08-12 ENCOUNTER — Telehealth: Payer: Self-pay | Admitting: *Deleted

## 2019-08-12 DIAGNOSIS — I471 Supraventricular tachycardia, unspecified: Secondary | ICD-10-CM | POA: Insufficient documentation

## 2019-08-12 NOTE — Progress Notes (Signed)
Cardiology Office Note   Date:  08/13/2019   ID:  Susan Brady, DOB [birth year], MRN [id number]  PCP:  Pincus Sanes, MD  Cardiologist:   Rollene Rotunda, MD    Chief Complaint  Patient presents with  . Palpitations      History of Present Illness: Susan Brady is a 74 y.o. female who presents for follow up of chest pain.  She did have an abnormal Lexiscan Myoview.  However she had normal coronaries on cath.  She did have cardiomegaly on CXR but had an echo after the last office appt with NL LV function.  There was grade two diastolic dysfunction.   She was having palpitations at the last appt.  I ordered an event monitor and it demonstrated frequent sustained SVT.   She saw Dr. Graciela Husbands who felt that this was atrial tach.  PAT.  She was treated with flecainide.    Since then she thinks she has had fewer palpitations although she has had at least once a day heart rates in the 140s may be lasting for an hour.  She is not sure she feels every time she goes rhythm but she thinks it is less than previous.  The problem is she is having heart rates in the 40s at x1 after delay of the beta-blocker in the morning.  Her daughter takes specific care of the measure.  She is not had any presyncope or syncope.  She denies any chest pressure, neck or arm discomfort.  She has had no shortness of breath, PND or orthopnea.  Said no weight gain or edema.  Past Medical History:  Diagnosis Date  . Anemia   . CHF (congestive heart failure) (HCC)   . Diabetes mellitus without complication (HCC)   . Gastric ulcer   . Glaucoma   . Hiatal hernia   . Hyperlipidemia   . Hypertension     Past Surgical History:  Procedure Laterality Date  . LEFT HEART CATH AND CORONARY ANGIOGRAPHY N/A 07/25/2016   Procedure: Left Heart Cath and Coronary Angiography;  Surgeon: Kathleene Hazel, MD;  Location: [facility] INVASIVE CV LAB;  Service: Cardiovascular;  Laterality: N/A;     Current Outpatient Medications    Medication Sig Dispense Refill  . acetaminophen (TYLENOL) 500 MG tablet Take 500-1,000 mg by mouth every 6 (six) hours as needed for mild pain.    Marland Kitchen aspirin EC 81 MG tablet Take 1 tablet (81 mg total) by mouth daily.    Marland Kitchen atorvastatin (LIPITOR) 20 MG tablet TAKE 1 TABLET BY MOUTH DAILY...OFFICE VISIT FOR FURTHER REFILLS 90 tablet 1  . augmented betamethasone dipropionate (DIPROLENE-AF) 0.05 % cream     . cholecalciferol (VITAMIN D) 1000 UNITS tablet Take 1,000 Units by mouth daily.     . flecainide (TAMBOCOR) 100 MG tablet Take 1 tablet (100 mg total) by mouth 2 (two) times daily. 180 tablet 3  . furosemide (LASIX) 20 MG tablet TAKE 1 TABLET(20 MG) BY MOUTH TWICE DAILY 180 tablet 1  . Garlic 1000 MG CAPS Take 1,000 mg by mouth daily.    . hydrALAZINE (APRESOLINE) 50 MG tablet TAKE 1&1/2 TABLETS BY MOUTH THREE TIMES DAILY. NEED FOLLOW UP FROM MORE REFILLS 405 tablet 1  . metoprolol tartrate (LOPRESSOR) 50 MG tablet Take 1 tablet (50 mg total) by mouth 2 (two) times daily. 180 tablet 3  . omega-3 acid ethyl esters (LOVAZA) 1 G capsule Take 1 g by mouth daily.     . pantoprazole (PROTONIX)  40 MG tablet TAKE 1 TABLET(40 MG) BY MOUTH DAILY 90 tablet 1  . potassium chloride SA (KLOR-CON) 20 MEQ tablet TAKE 1 TABLET(20 MEQ) BY MOUTH DAILY 90 tablet 1  . triamcinolone cream (KENALOG) 0.1 % Apply 1 application topically 2 (two) times daily. 30 g 0  . vitamin E 400 UNIT capsule Take 400 Units by mouth daily.     No current facility-administered medications for this visit.    Allergies:   Lisinopril    ROS:  Please see the history of present illness.   Otherwise, review of systems are positive for none.   All other systems are reviewed and negative.    PHYSICAL EXAM: VS:  BP 136/78   Pulse 60   Temp (!) 96.1 F (35.6 C)   Ht 5\' 11"  (1.803 m)   Wt 222 lb (100.7 kg)   SpO2 95%   BMI 30.96 kg/m  , BMI Body mass index is 30.96 kg/m. GENERAL:  Well appearing NECK:  No jugular venous  distention, waveform within normal limits, carotid upstroke brisk and symmetric, no bruits, no thyromegaly LUNGS:  Clear to auscultation bilaterally CHEST:  Unremarkable HEART:  PMI not displaced or sustained,S1 and S2 within normal limits, no S3, no S4, no clicks, no rubs, no murmurs ABD:  Flat, positive bowel sounds normal in frequency in pitch, no bruits, no rebound, no guarding, no midline pulsatile mass, no hepatomegaly, no splenomegaly EXT:  2 plus pulses throughout, no edema, no cyanosis no clubbing   EKG:  EKG is not ordered today.    Recent Labs: 03/03/2019: ALT 9; BUN 12; Creatinine, Ser 0.72; Hemoglobin 11.6; Platelets 314.0; Potassium 3.8; Sodium 141 06/02/2019: TSH 0.479    Lipid Panel    Component Value Date/Time   CHOL 128 03/03/2019 1343   TRIG 96.0 03/03/2019 1343   HDL 60.10 03/03/2019 1343   CHOLHDL 2 03/03/2019 1343   VLDL 19.2 03/03/2019 1343   LDLCALC 49 03/03/2019 1343      Wt Readings from Last 3 Encounters:  08/13/19 222 lb (100.7 kg)  08/01/19 215 lb (97.5 kg)  07/16/19 212 lb (96.2 kg)      Other studies Reviewed: Additional studies/ records that were reviewed today include: EP consult note Review of the above records demonstrates:  Please see elsewhere in the note.   ASSESSMENT AND PLAN:   SVT Think she is doing better on the flecainide.  However, she still having breakthrough.  I am going to increase the flecainide to 100 mg twice daily.  Because of the bradycardia although is back down to metoprolol 50 mg twice daily.  At some point we will do a monitor again.   Essential hypertension The blood pressure is normal but we will follow this as I adjust the medicines.   Cardiomegaly He has had a normal ejection fraction on EF.  There was no LVH.  No further work-up.  This was a suggestion from the chest x-ray.  Covid education She has had her vaccines.  Current medicines are reviewed at length with the patient today.  The patient does  not have concerns regarding medicines.   The following changes have been made:  As abpve  Labs/ tests ordered today include:    No orders of the defined types were placed in this encounter.    Disposition:   FU with me in two months. Ronnell Guadalajara, MD  08/13/2019 10:39 AM    [place]

## 2019-08-12 NOTE — Telephone Encounter (Signed)
-----   Message from Quintella Reichert, MD sent at 08/02/2019  2:07 PM EDT ----- Please let patient know that they have sleep apnea and recommend CPAP titration. Please set up titration in the sleep lab.

## 2019-08-12 NOTE — Telephone Encounter (Signed)
Informed patient of sleep study results and patient understanding was verbalized. Patient understands her sleep study showed they have sleep apnea and recommend CPAP titration. Please set up titration in the sleep lab.  Pt is aware and agreeable to her results.  Titration sent to sleep pool 

## 2019-08-13 ENCOUNTER — Ambulatory Visit (INDEPENDENT_AMBULATORY_CARE_PROVIDER_SITE_OTHER): Payer: Medicare Other | Admitting: Cardiology

## 2019-08-13 ENCOUNTER — Other Ambulatory Visit: Payer: Self-pay

## 2019-08-13 ENCOUNTER — Other Ambulatory Visit: Payer: Self-pay | Admitting: Cardiovascular Disease

## 2019-08-13 ENCOUNTER — Encounter: Payer: Self-pay | Admitting: Cardiology

## 2019-08-13 VITALS — BP 136/78 | HR 60 | Temp 96.1°F | Ht 71.0 in | Wt 222.0 lb

## 2019-08-13 DIAGNOSIS — I471 Supraventricular tachycardia: Secondary | ICD-10-CM

## 2019-08-13 DIAGNOSIS — G4733 Obstructive sleep apnea (adult) (pediatric): Secondary | ICD-10-CM

## 2019-08-13 MED ORDER — FLECAINIDE ACETATE 100 MG PO TABS: 100.0000 mg | ORAL_TABLET | Freq: Two times a day (BID) | ORAL | 3 refills | Status: DC

## 2019-08-13 MED ORDER — METOPROLOL TARTRATE 50 MG PO TABS: 50.0000 mg | ORAL_TABLET | Freq: Two times a day (BID) | ORAL | 3 refills | Status: DC

## 2019-08-13 NOTE — Patient Instructions (Signed)
Medication Instructions:  DECREASE YOUR METOPROLOL TO 50 MG TWICE A DAY   INCREASE YOUR FLECAINIDE TO 100 MG TWICE A DAY   *If you need a refill on your cardiac medications before your next appointment, please call your pharmacy*  Lab Work: NONE  Testing/Procedures: NONE  Follow-Up: At [facility], you and your health needs are our priority.  As part of our continuing mission to provide you with exceptional heart care, we have created designated Provider Care Teams.  These Care Teams include your primary Cardiologist (physician) and Advanced Practice Providers (APPs -  Physician Assistants and Nurse Practitioners) who all work together to provide you with the care you need, when you need it.  We recommend signing up for the patient portal called "MyChart".  Sign up information is provided on this After Visit Summary.  MyChart is used to connect with patients for Virtual Visits (Telemedicine).  Patients are able to view lab/test results, encounter notes, upcoming appointments, etc.  Non-urgent messages can be sent to your provider as well.   To learn more about what you can do with MyChart, go to [email or web address].    Your next appointment:   2 month(s)  The format for your next appointment:   In Person  Provider:   You may see DR [facility]  or one of the following Advanced Practice Providers on your designated Care Team:    Theodore Demark, PA-C  Joni Reining, DNP, ANP  Cadence Fransico Michael, NP

## 2019-09-04 ENCOUNTER — Other Ambulatory Visit (HOSPITAL_COMMUNITY): Payer: Medicare Other

## 2019-09-07 ENCOUNTER — Encounter (HOSPITAL_BASED_OUTPATIENT_CLINIC_OR_DEPARTMENT_OTHER): Payer: Medicare Other | Admitting: Cardiovascular Disease

## 2019-09-16 ENCOUNTER — Ambulatory Visit (HOSPITAL_BASED_OUTPATIENT_CLINIC_OR_DEPARTMENT_OTHER): Payer: Medicare Other | Attending: Cardiovascular Disease | Admitting: Cardiovascular Disease

## 2019-09-17 ENCOUNTER — Emergency Department (HOSPITAL_COMMUNITY)
Admission: EM | Admit: 2019-09-17 | Discharge: 2019-09-17 | Disposition: A | Discharge status: Home / Self Care | Payer: Medicare Other | Attending: Emergency Medicine | Admitting: Emergency Medicine

## 2019-09-17 ENCOUNTER — Emergency Department (HOSPITAL_COMMUNITY): Payer: Medicare Other

## 2019-09-17 ENCOUNTER — Ambulatory Visit: Payer: Medicare Other | Admitting: Podiatry

## 2019-09-17 ENCOUNTER — Telehealth (INDEPENDENT_AMBULATORY_CARE_PROVIDER_SITE_OTHER): Payer: Medicare Other | Admitting: Family

## 2019-09-17 ENCOUNTER — Encounter (HOSPITAL_COMMUNITY): Payer: Self-pay

## 2019-09-17 DIAGNOSIS — R42 Dizziness and giddiness: Secondary | ICD-10-CM | POA: Diagnosis not present

## 2019-09-17 DIAGNOSIS — Z79899 Other long term (current) drug therapy: Secondary | ICD-10-CM | POA: Diagnosis not present

## 2019-09-17 DIAGNOSIS — J320 Chronic maxillary sinusitis: Secondary | ICD-10-CM | POA: Diagnosis not present

## 2019-09-17 DIAGNOSIS — I5032 Chronic diastolic (congestive) heart failure: Secondary | ICD-10-CM | POA: Diagnosis not present

## 2019-09-17 DIAGNOSIS — Z87891 Personal history of nicotine dependence: Secondary | ICD-10-CM | POA: Diagnosis not present

## 2019-09-17 DIAGNOSIS — I6782 Cerebral ischemia: Secondary | ICD-10-CM | POA: Diagnosis not present

## 2019-09-17 DIAGNOSIS — Z7982 Long term (current) use of aspirin: Secondary | ICD-10-CM | POA: Diagnosis not present

## 2019-09-17 DIAGNOSIS — R001 Bradycardia, unspecified: Secondary | ICD-10-CM | POA: Diagnosis not present

## 2019-09-17 DIAGNOSIS — E119 Type 2 diabetes mellitus without complications: Secondary | ICD-10-CM | POA: Diagnosis not present

## 2019-09-17 DIAGNOSIS — I110 Hypertensive heart disease with heart failure: Secondary | ICD-10-CM | POA: Insufficient documentation

## 2019-09-17 DIAGNOSIS — J3489 Other specified disorders of nose and nasal sinuses: Secondary | ICD-10-CM | POA: Diagnosis not present

## 2019-09-17 DIAGNOSIS — J341 Cyst and mucocele of nose and nasal sinus: Secondary | ICD-10-CM | POA: Diagnosis not present

## 2019-09-17 LAB — I-STAT CHEM 8, ED
BUN: 12 mg/dL (ref 8–23)
Calcium, Ion: 1.1 mmol/L — ABNORMAL LOW (ref 1.15–1.40)
Chloride: 103 mmol/L (ref 98–111)
Creatinine, Ser: 0.7 mg/dL (ref 0.44–1.00)
Glucose, Bld: 106 mg/dL — ABNORMAL HIGH (ref 70–99)
HCT: 43 % (ref 36.0–46.0)
Hemoglobin: 14.6 g/dL (ref 12.0–15.0)
Potassium: 3.8 mmol/L (ref 3.5–5.1)
Sodium: 141 mmol/L (ref 135–145)
TCO2: 31 mmol/L (ref 22–32)

## 2019-09-17 LAB — CBC
HCT: 41.1 % (ref 36.0–46.0)
Hemoglobin: 12.6 g/dL (ref 12.0–15.0)
MCH: 27.5 pg (ref 26.0–34.0)
MCHC: 30.7 g/dL (ref 30.0–36.0)
MCV: 89.7 fL (ref 80.0–100.0)
Platelets: 314 10*3/uL (ref 150–400)
RBC: 4.58 MIL/uL (ref 3.87–5.11)
RDW: 13.8 % (ref 11.5–15.5)
WBC: 5.2 10*3/uL (ref 4.0–10.5)
nRBC: 0 % (ref 0.0–0.2)

## 2019-09-17 LAB — DIFFERENTIAL
Abs Immature Granulocytes: 0.01 10*3/uL (ref 0.00–0.07)
Basophils Absolute: 0 10*3/uL (ref 0.0–0.1)
Basophils Relative: 0 %
Eosinophils Absolute: 0.1 10*3/uL (ref 0.0–0.5)
Eosinophils Relative: 2 %
Immature Granulocytes: 0 %
Lymphocytes Relative: 48 %
Lymphs Abs: 2.5 10*3/uL (ref 0.7–4.0)
Monocytes Absolute: 0.6 10*3/uL (ref 0.1–1.0)
Monocytes Relative: 11 %
Neutro Abs: 2 10*3/uL (ref 1.7–7.7)
Neutrophils Relative %: 39 %

## 2019-09-17 LAB — COMPREHENSIVE METABOLIC PANEL
ALT: 12 U/L (ref 0–44)
AST: 16 U/L (ref 15–41)
Albumin: 4 g/dL (ref 3.5–5.0)
Alkaline Phosphatase: 54 U/L (ref 38–126)
Anion gap: 9 (ref 5–15)
BUN: 10 mg/dL (ref 8–23)
CO2: 27 mmol/L (ref 22–32)
Calcium: 9.6 mg/dL (ref 8.9–10.3)
Chloride: 104 mmol/L (ref 98–111)
Creatinine, Ser: 0.82 mg/dL (ref 0.44–1.00)
GFR calc Af Amer: >60 mL/min (ref >60)
GFR calc non Af Amer: >60 mL/min (ref >60)
Glucose, Bld: 108 mg/dL — ABNORMAL HIGH (ref 70–99)
Potassium: 3.9 mmol/L (ref 3.5–5.1)
Sodium: 140 mmol/L (ref 135–145)
Total Bilirubin: 0.9 mg/dL (ref 0.3–1.2)
Total Protein: 6.9 g/dL (ref 6.5–8.1)

## 2019-09-17 LAB — CBG MONITORING, ED: Glucose-Capillary: 103 mg/dL — ABNORMAL HIGH (ref 70–99)

## 2019-09-17 LAB — PROTIME-INR
INR: 1.1 (ref 0.8–1.2)
Prothrombin Time: 13.5 seconds (ref 11.4–15.2)

## 2019-09-17 LAB — APTT: aPTT: 28 seconds (ref 24–36)

## 2019-09-17 MED ORDER — SODIUM CHLORIDE 0.9% FLUSH: 3.0000 mL | Freq: Once | INTRAVENOUS | Status: DC

## 2019-09-17 MED ORDER — MECLIZINE HCL 25 MG PO TABS
25.0000 mg | ORAL_TABLET | Freq: Once | ORAL | Status: AC
  Administered 2019-09-17: 25 mg via ORAL
  Filled 2019-09-17: qty 1

## 2019-09-17 MED ORDER — METOPROLOL TARTRATE 25 MG PO TABS: 25.0000 mg | ORAL_TABLET | Freq: Two times a day (BID) | ORAL | 0 refills | Status: DC

## 2019-09-17 NOTE — Progress Notes (Signed)
Susan Brady is a 74 y.o. female with the following history as recorded in EpicCare:  Patient Active Problem List   Diagnosis Date Noted  . SVT (supraventricular tachycardia) (HCC) 08/12/2019  . Educated about COVID-19 virus infection 05/31/2019  . Chronic right shoulder pain 05/11/2019  . Palpitations 05/11/2019  . Pain and swelling of knee, right 03/29/2019  . Neck pain, musculoskeletal 03/15/2019  . Eczema 03/03/2019  . Dizziness 09/22/2018  . Lightheadedness 04/24/2018  . Chronic diastolic heart failure (HCC) 04/14/2018  . Rash and nonspecific skin eruption 07/16/2017  . Poor balance 12/20/2016  . Right flank pain 12/20/2016  . Normal coronary arteries 08/07/2016  . Cardiomegaly 08/07/2016  . Dupuytren's contracture of right hand 07/19/2016  . Hyperlipidemia 06/25/2016  . Neck pain on right side 11/14/2015  . Poor sleep pattern 11/14/2015  . Constipation 11/14/2015  . Hiatal hernia, large 08/30/2015  . Gastric ulcer 08/30/2015  . Gastritis 08/15/2015  . Left shoulder pain 06/22/2015  . Essential hypertension 11/22/2014  . Non-insulin dependent type 2 diabetes mellitus (HCC) 11/22/2014  . Arthritis 11/22/2014  . Glaucoma 11/22/2014  . GERD (gastroesophageal reflux disease) 11/22/2014  . Varicose veins 11/22/2014  . Myocardial infarct, old 11/22/2014  . Superficial thrombophlebitis of lower extremity 11/22/2014    Current Outpatient Medications  Medication Sig Dispense Refill  . acetaminophen (TYLENOL) 500 MG tablet Take 500-1,000 mg by mouth every 6 (six) hours as needed for mild pain.    Marland Kitchen aspirin EC 81 MG tablet Take 1 tablet (81 mg total) by mouth daily.    Marland Kitchen atorvastatin (LIPITOR) 20 MG tablet TAKE 1 TABLET BY MOUTH DAILY...OFFICE VISIT FOR FURTHER REFILLS 90 tablet 1  . augmented betamethasone dipropionate (DIPROLENE-AF) 0.05 % cream     . cholecalciferol (VITAMIN D) 1000 UNITS tablet Take 1,000 Units by mouth daily.     . flecainide (TAMBOCOR) 100 MG tablet Take  1 tablet (100 mg total) by mouth 2 (two) times daily. 180 tablet 3  . furosemide (LASIX) 20 MG tablet TAKE 1 TABLET(20 MG) BY MOUTH TWICE DAILY 180 tablet 1  . Garlic 1000 MG CAPS Take 1,000 mg by mouth daily.    . hydrALAZINE (APRESOLINE) 50 MG tablet TAKE 1&1/2 TABLETS BY MOUTH THREE TIMES DAILY. NEED FOLLOW UP FROM MORE REFILLS 405 tablet 1  . metoprolol tartrate (LOPRESSOR) 50 MG tablet Take 1 tablet (50 mg total) by mouth 2 (two) times daily. 180 tablet 3  . omega-3 acid ethyl esters (LOVAZA) 1 G capsule Take 1 g by mouth daily.     . pantoprazole (PROTONIX) 40 MG tablet TAKE 1 TABLET(40 MG) BY MOUTH DAILY 90 tablet 1  . potassium chloride SA (KLOR-CON) 20 MEQ tablet TAKE 1 TABLET(20 MEQ) BY MOUTH DAILY 90 tablet 1  . triamcinolone cream (KENALOG) 0.1 % Apply 1 application topically 2 (two) times daily. 30 g 0  . vitamin E 400 UNIT capsule Take 400 Units by mouth daily.     No current facility-administered medications for this visit.    Allergies: Lisinopril  Past Medical History:  Diagnosis Date  . Anemia   . CHF (congestive heart failure) (HCC)   . Diabetes mellitus without complication (HCC)   . Gastric ulcer   . Glaucoma   . Hiatal hernia   . Hyperlipidemia   . Hypertension     Past Surgical History:  Procedure Laterality Date  . LEFT HEART CATH AND CORONARY ANGIOGRAPHY N/A 07/25/2016   Procedure: Left Heart Cath and Coronary Angiography;  Surgeon:  Kathleene Hazel, MD;  Location: [facility] INVASIVE CV LAB;  Service: Cardiovascular;  Laterality: N/A;    Family History  Problem Relation Age of Onset  . Heart disease Mother   . Alcohol abuse Father   . Alcohol abuse Sister   . Diabetes Sister   . Hypertension Sister   . Cancer Sister   . CAD Sister        One sister with stents  . Alcohol abuse Brother   . Diabetes Brother   . Hypertension Brother     Social History   Tobacco Use  . Smoking status: Former Games developer  . Smokeless tobacco: Never Used  Substance Use  Topics  . Alcohol use: No    Alcohol/week: 0.0 standard drinks    Subjective:    I connected with Liz Beach on 09/17/19 at  9:40 AM EDT by a telephone call and verified that I am speaking with the correct person using two identifiers.   I discussed the limitations of evaluation and management by telemedicine and the availability of in person appointments. The patient expressed understanding and agreed to proceed. Provider in office/ patient is at home; provider and patient are only 2 people on video call.   Daughter helps provider the history; patient notes that for the past 24 hours she has been feeling so dizzy she cannot stand up; does have history of SVT- does not always feel her symptoms; has been having some nausea; was told that could not be seen in the office today and had to have a virtual visit- does not have access to MyChart;    Objective:  There were no vitals filed for this visit.  Lungs: Respirations unlabored;  Neurologic: Alert and oriented; speech intact;    1. Dizziness     Plan:  Due to the severity of the symptoms being described-not being able to stand up, patient needs in office evaluation; daughter in agreement and will taker her mother to the ER for further evaluation;  Time spent 8 minutes  No follow-ups on file.  No orders of the defined types were placed in this encounter.   Requested Prescriptions    No prescriptions requested or ordered in this encounter

## 2019-09-17 NOTE — ED Triage Notes (Addendum)
Pt sent to ED by PCP w/ c/o dizziness, n/v, headache, intermittent blurred vision, and sob. LKW yesterday at 0800. Pt denies slurred speech, unilateral weakness, syncope, chest pain. Pt AOx4 in triage, resp, e/u.

## 2019-09-17 NOTE — Discharge Instructions (Addendum)
Based on the recommendations from Dr. Cristal Deer, please decrease the dose of your metoprolol to 25 mg twice daily.  Take the prescribed meclizine as needed for dizziness.  If you have worsening dizziness, vomiting, numbness, weakness, vision changes or other new concerning symptom, please return to ER for reassessment.  Keep your appointment with cardiology next week.  Future Appointments  Date Time Provider Department Center  09/24/2019  2:00 PM Rollene Rotunda, MD [facility]  12/10/2019  9:30 AM Lawerance Bach, Bobette Mo, MD [facility] None

## 2019-09-17 NOTE — ED Provider Notes (Signed)
[facility] Provider Note   CSN: [id number] Arrival date & time: 09/17/19  1050     History Chief Complaint  Patient presents with   Dizziness    Susan Brady is a 74 y.o. female. Presents here with concern for dizziness. Patient reports that started having dizziness yesterday, persisted throughout most of the day. This morning seems to have improved. When she reviewed this with her primary doctor, they recommended she come to ER for further eval. Described as room spinning sensation. No episodes of passing out, no trauma, no fever.  HPI     Past Medical History:  Diagnosis Date   Anemia    CHF (congestive heart failure) (HCC)    Diabetes mellitus without complication (HCC)    Gastric ulcer    Glaucoma    Hiatal hernia    Hyperlipidemia    Hypertension     Patient Active Problem List   Diagnosis Date Noted   SVT (supraventricular tachycardia) (HCC) 08/12/2019   Educated about COVID-19 virus infection 05/31/2019   Chronic right shoulder pain 05/11/2019   Palpitations 05/11/2019   Pain and swelling of knee, right 03/29/2019   Neck pain, musculoskeletal 03/15/2019   Eczema 03/03/2019   Dizziness 09/22/2018   Lightheadedness 04/24/2018   Chronic diastolic heart failure (HCC) 04/14/2018   Rash and nonspecific skin eruption 07/16/2017   Poor balance 12/20/2016   Right flank pain 12/20/2016   Normal coronary arteries 08/07/2016   Cardiomegaly 08/07/2016   Dupuytren's contracture of right hand 07/19/2016   Hyperlipidemia 06/25/2016   Neck pain on right side 11/14/2015   Poor sleep pattern 11/14/2015   Constipation 11/14/2015   Hiatal hernia, large 08/30/2015   Gastric ulcer 08/30/2015   Gastritis 08/15/2015   Left shoulder pain 06/22/2015   Essential hypertension 11/22/2014   Non-insulin dependent type 2 diabetes mellitus (HCC) 11/22/2014   Arthritis 11/22/2014   Glaucoma 11/22/2014    GERD (gastroesophageal reflux disease) 11/22/2014   Varicose veins 11/22/2014   Myocardial infarct, old 11/22/2014   Superficial thrombophlebitis of lower extremity 11/22/2014    Past Surgical History:  Procedure Laterality Date   LEFT HEART CATH AND CORONARY ANGIOGRAPHY N/A 07/25/2016   Procedure: Left Heart Cath and Coronary Angiography;  Surgeon: Kathleene HazelMcAlhany, Christopher D, MD;  Location: [facility] INVASIVE CV LAB;  Service: Cardiovascular;  Laterality: N/A;     OB History   No obstetric history on file.     Family History  Problem Relation Age of Onset   Heart disease Mother    Alcohol abuse Father    Alcohol abuse Sister    Diabetes Sister    Hypertension Sister    Cancer Sister    CAD Sister        One sister with stents   Alcohol abuse Brother    Diabetes Brother    Hypertension Brother     Social History   Tobacco Use   Smoking status: Former Smoker   Smokeless tobacco: Never Used  Building services engineerVaping Use   Vaping Use: Never used  Substance Use Topics   Alcohol use: No    Alcohol/week: 0.0 standard drinks   Drug use: No    Home Medications Prior to Admission medications   Medication Sig Start Date End Date Taking? Authorizing Provider  acetaminophen (TYLENOL) 500 MG tablet Take 500-1,000 mg by mouth every 6 (six) hours as needed for mild pain.   Yes [provider]  aspirin EC 81 MG tablet Take 1 tablet (81 mg  total) by mouth daily. 03/24/15  Yes Burns, Bobette Mo, MD  cholecalciferol (VITAMIN D) 1000 UNITS tablet Take 5,000 Units by mouth daily.    Yes [provider]  flecainide (TAMBOCOR) 100 MG tablet Take 1 tablet (100 mg total) by mouth 2 (two) times daily. 08/13/19  Yes Rollene Rotunda, MD  furosemide (LASIX) 20 MG tablet TAKE 1 TABLET(20 MG) BY MOUTH TWICE DAILY Patient taking differently: Take 20 mg by mouth 2 (two) times daily.  06/15/19  Yes Burns, Bobette Mo, MD  Garlic 1000 MG CAPS Take 1,000 mg by mouth daily.   Yes [provider]  hydrALAZINE (APRESOLINE) 50 MG tablet TAKE 1&1/2 TABLETS BY MOUTH THREE TIMES DAILY. NEED FOLLOW UP FROM MORE REFILLS Patient taking differently: Take 75 mg by mouth 3 (three) times daily.  07/06/19  Yes Burns, Bobette Mo, MD  Omega-3 Fatty Acids (FISH OIL) 1000 MG CAPS Take 1 capsule by mouth daily.   Yes [provider]  pantoprazole (PROTONIX) 40 MG tablet TAKE 1 TABLET(40 MG) BY MOUTH DAILY Patient taking differently: Take 40 mg by mouth daily.  06/15/19  Yes Burns, Bobette Mo, MD  potassium chloride SA (KLOR-CON) 20 MEQ tablet TAKE 1 TABLET(20 MEQ) BY MOUTH DAILY Patient taking differently: Take 20 mEq by mouth daily.  06/15/19  Yes Burns, Bobette Mo, MD  triamcinolone cream (KENALOG) 0.1 % Apply 1 application topically 2 (two) times daily. Patient taking differently: Apply 1 application topically daily as needed (hands).  03/15/19  Yes Burns, Bobette Mo, MD  vitamin E 400 UNIT capsule Take 1,000 Units by mouth daily.    Yes [provider]  atorvastatin (LIPITOR) 20 MG tablet TAKE 1 TABLET BY MOUTH DAILY...OFFICE VISIT FOR FURTHER REFILLS Patient not taking: Reported on 09/17/2019 03/16/19   Pincus Sanes, MD  metoprolol tartrate (LOPRESSOR) 25 MG tablet Take 1 tablet (25 mg total) by mouth 2 (two) times daily. 09/17/19   Milagros Loll, MD    Allergies    Lisinopril  Review of Systems   Review of Systems  Constitutional: Negative for chills and fever.  HENT: Negative for ear pain and sore throat.   Eyes: Negative for pain and visual disturbance.  Respiratory: Negative for cough and shortness of breath.   Cardiovascular: Negative for chest pain and palpitations.  Gastrointestinal: Negative for abdominal pain and vomiting.  Genitourinary: Negative for dysuria and hematuria.  Musculoskeletal: Negative for arthralgias and back pain.  Skin: Negative for color change and rash.  Neurological: Positive for dizziness. Negative for seizures and syncope.  All other systems reviewed  and are negative.   Physical Exam Updated Vital Signs BP (!) 144/70    Pulse 66    Temp 98.3 F (36.8 C) (Oral)    Resp 13    SpO2 100%   Physical Exam Vitals and nursing note reviewed.  Constitutional:      General: She is not in acute distress.    Appearance: She is well-developed.  HENT:     Head: Normocephalic and atraumatic.     Right Ear: Tympanic membrane, ear canal and external ear normal. There is no impacted cerumen.     Left Ear: Tympanic membrane, ear canal and external ear normal.     Nose: Nose normal.  Eyes:     Conjunctiva/sclera: Conjunctivae normal.  Cardiovascular:     Rate and Rhythm: Regular rhythm. Bradycardia present.     Heart sounds: No murmur heard.   Pulmonary:     Effort:  Pulmonary effort is normal. No respiratory distress.     Breath sounds: Normal breath sounds.  Abdominal:     Palpations: Abdomen is soft.     Tenderness: There is no abdominal tenderness.  Musculoskeletal:        General: No deformity or signs of injury.     Cervical back: Neck supple.  Skin:    General: Skin is warm and dry.     Capillary Refill: Capillary refill takes less than 2 seconds.  Neurological:     General: No focal deficit present.     Mental Status: She is alert.     Comments: AAOx3 CN 2-12 intact, speech clear visual fields intact 5/5 strength in b/l UE and LE Sensation to light touch intact in b/l UE and LE Normal FNF Normal gait  Psychiatric:        Mood and Affect: Mood normal.        Behavior: Behavior normal.     ED Results / Procedures / Treatments   Labs (all labs ordered are listed, but only abnormal results are displayed) Labs Reviewed  COMPREHENSIVE METABOLIC PANEL - Abnormal; Notable for the following components:      Result Value   Glucose, Bld 108 (*)    All other components within normal limits  I-STAT CHEM 8, ED - Abnormal; Notable for the following components:   Glucose, Bld 106 (*)    Calcium, Ion 1.10 (*)    All other  components within normal limits  CBG MONITORING, ED - Abnormal; Notable for the following components:   Glucose-Capillary 103 (*)    All other components within normal limits  PROTIME-INR  APTT  CBC  DIFFERENTIAL    EKG EKG Interpretation  Date/Time:  Friday September 17 2019 11:34:39 EDT Ventricular Rate:  47 PR Interval:  194 QRS Duration: 72 QT Interval:  476 QTC Calculation: 421 R Axis:   -67 Text Interpretation: Sinus bradycardia Left axis deviation Low voltage QRS Possible Anterolateral infarct , age undetermined Abnormal ECG Confirmed by Marianna Fuss ([id number]) on 09/17/2019 1:07:36 PM   Radiology MR BRAIN WO CONTRAST  Result Date: 09/17/2019 CLINICAL DATA:  Vertigo, central; acute onset vertigo, concern for posterior circulation stroke. Additional provided: Patient reports dizziness, nausea/vomiting, headache, intermittent blurred vision, shortness of breath. EXAM: MRI HEAD WITHOUT CONTRAST TECHNIQUE: Multiplanar, multiecho pulse sequences of the brain and surrounding structures were obtained without intravenous contrast. COMPARISON:  Prior head CT examinations 12/09/2014 and earlier. FINDINGS: Brain: Cerebral volume is normal for age. Minimal scattered T2/FLAIR hyperintensity within the cerebral white matter is nonspecific, but consistent with chronic small vessel ischemic disease. Incidentally noted 8 mm right choroid fissure cyst. There is no acute infarct. No evidence of intracranial mass. No chronic intracranial blood products. No extra-axial fluid collection. No midline shift. Partially empty sella turcica. Vascular: Expected proximal arterial flow voids. Skull and upper cervical spine: No focal marrow lesion. Incompletely assessed cervical spondylosis. Sinuses/Orbits: Visualized orbits show no acute finding. Mild ethmoid and maxillary sinus mucosal thickening. Mucous retention cysts within the left maxillary and left sphenoid sinuses. No significant mastoid effusion. IMPRESSION:  No evidence of acute intracranial abnormality, including acute infarction. Minimal chronic small-vessel ischemic changes within the cerebral white matter. Mild ethmoid and maxillary sinus mucosal thickening. Mucous retention cysts are present within the left maxillary and left sphenoid sinuses. Electronically Signed   By: Jackey Loge DO   On: 09/17/2019 14:13    Procedures Procedures (including critical care time)  Medications Ordered in ED  Medications  sodium chloride flush (NS) 0.9 % injection 3 mL (has no administration in time range)  meclizine (ANTIVERT) tablet 25 mg (25 mg Oral Given 09/17/19 1246)    ED Course  I have reviewed the triage vital signs and the nursing notes.  Pertinent labs & imaging results that were available during my care of the patient were reviewed by me and considered in my medical decision making (see chart for details).  Clinical Course as of Sep 16 1641  Fri Sep 17, 2019  1441 Discussed with cardiology - Cristal Deer   [RD]    Clinical Course User Index [RD] Milagros Loll, MD   MDM Rules/Calculators/A&P                         74 year old lady presenting to ER with concern for dizziness. Dizziness primarily occurred yesterday and has not had persistence of symptoms today. Given age, no prior history of vertigo, obtain MRI to rule out stroke. MRI was negative. Labs were grossly stable. She was noted to be bradycardic however her blood pressure remained stable throughout ER visit. Patient has long history of SVT and is followed closely by cardiology. She is managed on both metoprolol and flecainide. I have reviewed the medication regimen with Dr. Cristal Deer who is on-call for cardiology, she recommended changing her dose of metoprolol to 25 mg twice daily. Discussed with patient and discharged home. Patient has follow-up with cardiology next week.    After the discussed management above, the patient was determined to be safe for discharge.  The patient  was in agreement with this plan and all questions regarding their care were answered.  ED return precautions were discussed and the patient will return to the ED with any significant worsening of condition.   Final Clinical Impression(s) / ED Diagnoses Final diagnoses:  Dizziness  Bradycardia    Rx / DC Orders ED Discharge Orders         Ordered    metoprolol tartrate (LOPRESSOR) 25 MG tablet  2 times daily     Discontinue  Reprint    Note to Pharmacy: NEW DOSE, PATIENT WILL CALL WHEN NEEDS FILLED   09/17/19 1505           Milagros Loll, MD 09/17/19 1643

## 2019-09-23 NOTE — Progress Notes (Signed)
Cardiology Office Note   Date:  09/24/2019   ID:  Susan Brady, DOB 12/27/1945, MRN [id number]  PCP:  Pincus Sanes, MD  Cardiologist:   Rollene Rotunda, MD    Chief Complaint  Patient presents with  . Headache      History of Present Illness: Susan Brady is a 74 y.o. female who presents for follow up of chest pain.  She did have an abnormal Lexiscan Myoview.  However she had normal coronaries on cath.  She did have cardiomegaly on CXR but had an echo with NL LV function.  There was grade two diastolic dysfunction.   She had palpitations and had a monitor which demonstrated frequent sustained SVT.   She saw Dr. Graciela Husbands who felt that this was atrial tach.  She was treated with flecainide.  She had some breakthrough tachycardia previously so I did increase her flecainide from 50 twice a day to 100 twice a day.  She thinks this is helped with her palpitations but she thinks she is getting a headache every time she takes the dose.  She is under the mild frontal headache.  She is not having any acute presyncope or syncope.  She did have dizziness and she was in the emergency room at the end of last month and I reviewed these records.  She had an MRI which was nonacute.  She was thought to have vertigo and was treated with meclizine.  She has not had any new symptoms related to this or further exacerbations.  She walks her dog.  She does not have any new shortness of breath, PND or orthopnea.   Past Medical History:  Diagnosis Date  . Anemia   . CHF (congestive heart failure) (HCC)   . Diabetes mellitus without complication (HCC)   . Gastric ulcer   . Glaucoma   . Hiatal hernia   . Hyperlipidemia   . Hypertension     Past Surgical History:  Procedure Laterality Date  . LEFT HEART CATH AND CORONARY ANGIOGRAPHY N/A 07/25/2016   Procedure: Left Heart Cath and Coronary Angiography;  Surgeon: Kathleene Hazel, MD;  Location: [facility] INVASIVE CV LAB;  Service: Cardiovascular;  Laterality:  N/A;     Current Outpatient Medications  Medication Sig Dispense Refill  . acetaminophen (TYLENOL) 500 MG tablet Take 500-1,000 mg by mouth every 6 (six) hours as needed for mild pain.    Marland Kitchen aspirin EC 81 MG tablet Take 1 tablet (81 mg total) by mouth daily.    Marland Kitchen atorvastatin (LIPITOR) 20 MG tablet TAKE 1 TABLET BY MOUTH DAILY...OFFICE VISIT FOR FURTHER REFILLS 90 tablet 1  . cholecalciferol (VITAMIN D) 1000 UNITS tablet Take 5,000 Units by mouth daily.     . flecainide (TAMBOCOR) 50 MG tablet Take 1.5 tablets (75 mg total) by mouth 2 (two) times daily. 60 tablet 3  . furosemide (LASIX) 20 MG tablet TAKE 1 TABLET(20 MG) BY MOUTH TWICE DAILY (Patient taking differently: Take 20 mg by mouth 2 (two) times daily. ) 180 tablet 1  . Garlic 1000 MG CAPS Take 1,000 mg by mouth daily.    . hydrALAZINE (APRESOLINE) 50 MG tablet TAKE 1&1/2 TABLETS BY MOUTH THREE TIMES DAILY. NEED FOLLOW UP FROM MORE REFILLS (Patient taking differently: Take 75 mg by mouth 3 (three) times daily. ) 405 tablet 1  . metoprolol tartrate (LOPRESSOR) 25 MG tablet Take 1 tablet (25 mg total) by mouth 2 (two) times daily. 60 tablet 0  . Omega-3 Fatty Acids (  FISH OIL) 1000 MG CAPS Take 1 capsule by mouth daily.    . pantoprazole (PROTONIX) 40 MG tablet TAKE 1 TABLET(40 MG) BY MOUTH DAILY (Patient taking differently: Take 40 mg by mouth daily. ) 90 tablet 1  . potassium chloride SA (KLOR-CON) 20 MEQ tablet TAKE 1 TABLET(20 MEQ) BY MOUTH DAILY (Patient taking differently: Take 20 mEq by mouth daily. ) 90 tablet 1  . triamcinolone cream (KENALOG) 0.1 % Apply 1 application topically 2 (two) times daily. (Patient taking differently: Apply 1 application topically daily as needed (hands). ) 30 g 0  . vitamin E 400 UNIT capsule Take 1,000 Units by mouth daily.      No current facility-administered medications for this visit.    Allergies:   Lisinopril    ROS:  Please see the history of present illness.   Otherwise, review of systems  are positive for none.   All other systems are reviewed and negative.    PHYSICAL EXAM: VS:  BP 130/80 (BP Location: Left Arm, Patient Position: Sitting, Cuff Size: Normal)   Pulse (!) 57   Ht 5\' 11"  (1.803 m)   Wt 209 lb (94.8 kg)   BMI 29.15 kg/m  , BMI Body mass index is 29.15 kg/m. GENERAL:  Well appearing NECK:  No jugular venous distention, waveform within normal limits, carotid upstroke brisk and symmetric, no bruits, no thyromegaly LUNGS:  Clear to auscultation bilaterally CHEST:  Unremarkable HEART:  PMI not displaced or sustained,S1 and S2 within normal limits, no S3, no S4, no clicks, no rubs, no murmurs ABD:  Flat, positive bowel sounds normal in frequency in pitch, no bruits, no rebound, no guarding, no midline pulsatile mass, no hepatomegaly, no splenomegaly EXT:  2 plus pulses throughout, no edema, no cyanosis no clubbing   EKG:  EKG is not ordered today.    Recent Labs: 06/02/2019: TSH 0.479 09/17/2019: ALT 12; BUN 12; Creatinine, Ser 0.70; Hemoglobin 14.6; Platelets 314; Potassium 3.8; Sodium 141    Lipid Panel    Component Value Date/Time   CHOL 128 03/03/2019 1343   TRIG 96.0 03/03/2019 1343   HDL 60.10 03/03/2019 1343   CHOLHDL 2 03/03/2019 1343   VLDL 19.2 03/03/2019 1343   LDLCALC 49 03/03/2019 1343      Wt Readings from Last 3 Encounters:  09/24/19 209 lb (94.8 kg)  08/13/19 222 lb (100.7 kg)  08/01/19 215 lb (97.5 kg)      Other studies Reviewed: Additional studies/ records that were reviewed today include: ED records Review of the above records demonstrates:  Please see elsewhere in the note.   ASSESSMENT AND PLAN:   SVT She she is having side effects with the higher dose of flecainide so I am going to try 75 mg twice daily.   Essential hypertension The blood pressure is controlled.  She will continue the meds as listed.   Cardiomegaly He has had a normal ejection fraction on EF.  No change in therapy.   Covid education She has  been vaccinated.  She has had her vaccines.  Current medicines are reviewed at length with the patient today.  The patient does not have concerns regarding medicines.   The following changes have been made:  As above  Labs/ tests ordered today include:    Orders Placed This Encounter  Procedures  . LONG TERM MONITOR (3-14 DAYS)     Disposition:   FU with me in two months.     Signed, 08/03/19, MD  09/24/2019 2:58 PM    [facility]

## 2019-09-24 ENCOUNTER — Other Ambulatory Visit: Payer: Self-pay

## 2019-09-24 ENCOUNTER — Telehealth: Payer: Self-pay | Admitting: Radiology

## 2019-09-24 ENCOUNTER — Ambulatory Visit (INDEPENDENT_AMBULATORY_CARE_PROVIDER_SITE_OTHER): Payer: Medicare Other | Admitting: Cardiology

## 2019-09-24 ENCOUNTER — Encounter: Payer: Self-pay | Admitting: Cardiology

## 2019-09-24 VITALS — BP 130/80 | HR 57 | Ht 71.0 in | Wt 209.0 lb

## 2019-09-24 DIAGNOSIS — I517 Cardiomegaly: Secondary | ICD-10-CM

## 2019-09-24 DIAGNOSIS — I471 Supraventricular tachycardia: Secondary | ICD-10-CM

## 2019-09-24 DIAGNOSIS — I10 Essential (primary) hypertension: Secondary | ICD-10-CM | POA: Diagnosis not present

## 2019-09-24 MED ORDER — FLECAINIDE ACETATE 50 MG PO TABS: 75.0000 mg | ORAL_TABLET | Freq: Two times a day (BID) | ORAL | 3 refills | Status: DC

## 2019-09-24 NOTE — Telephone Encounter (Signed)
Enrolled patient for a 14 day Zio monitor to be mailed to patients home.  

## 2019-09-24 NOTE — Patient Instructions (Addendum)
Medication Instructions:   DECREASE Flecainide to 75 mg (1 & 1/2 tab) twice daily.  *If you need a refill on your cardiac medications before your next appointment, please call your pharmacy*   Testing:  ZIO XT- Long Term Monitor Instructions   Your physician has requested you wear your ZIO patch monitor___14___days.   This is a single patch monitor.  [employer] supplies one patch monitor per enrollment.  Additional stickers are not available.   Please do not apply patch if you will be having a Nuclear Stress Test, Echocardiogram, Cardiac CT, MRI, or Chest Xray during the time frame you would be wearing the monitor. The patch cannot be worn during these tests.  You cannot remove and re-apply the ZIO XT patch monitor.   Your ZIO patch monitor will be sent USPS Priority mail from [facility] directly to your home address. The monitor may also be mailed to a PO BOX if home delivery is not available.   It may take 3-5 days to receive your monitor after you have been enrolled.   Once you have received you monitor, please review enclosed instructions.  Your monitor has already been registered assigning a specific monitor serial # to you.   Applying the monitor   Shave hair from upper left chest.   Hold abrader disc by orange tab.  Rub abrader in 40 strokes over left upper chest as indicated in your monitor instructions.   Clean area with 4 enclosed alcohol pads .  Use all pads to assure are is cleaned thoroughly.  Let dry.   Apply patch as indicated in monitor instructions.  Patch will be place under collarbone on left side of chest with arrow pointing upward.   Rub patch adhesive wings for 2 minutes.Remove white label marked "1".  Remove white label marked "2".  Rub patch adhesive wings for 2 additional minutes.   While looking in a mirror, press and release button in center of patch.  A small green light will flash 3-4 times .  This will be your only indicator the monitor has been  turned on.     Do not shower for the first 24 hours.  You may shower after the first 24 hours.   Press button if you feel a symptom. You will hear a small click.  Record Date, Time and Symptom in the Patient Log Book.   When you are ready to remove patch, follow instructions on last 2 pages of Patient Log Book.  Stick patch monitor onto last page of Patient Log Book.   Place Patient Log Book in [place] box.  Use locking tab on box and tape box closed securely.  The [employer] has [employer] on it.  Please place in mailbox as soon as possible.  Your physician should have your test results approximately 7 days after the monitor has been mailed back to [facility].   Call [facility] Customer Care at [phone] if you have questions regarding your ZIO XT patch monitor.  Call them immediately if you see an orange light blinking on your monitor.   If your monitor falls off in less than 4 days contact our Monitor department at [phone].  If your monitor becomes loose or falls off after 4 days call [employer] at [phone] for suggestions on securing your monitor.    Follow-Up: At [facility], you and your health needs are our priority.  As part of our continuing mission to provide you with exceptional heart care, we  have created designated Provider Care Teams.  These Care Teams include your primary Cardiologist (physician) and Advanced Practice Providers (APPs -  Physician Assistants and Nurse Practitioners) who all work together to provide you with the care you need, when you need it.  We recommend signing up for the patient portal called "MyChart".  Sign up information is provided on this After Visit Summary.  MyChart is used to connect with patients for Virtual Visits (Telemedicine).  Patients are able to view lab/test results, encounter notes, upcoming appointments, etc.  Non-urgent messages can be sent to your provider as well.   To learn more about what you can do with  MyChart, go to [email or web address].    Your next appointment:   2 month(s)  The format for your next appointment:   In Person  Provider:   Rollene Rotunda, MD

## 2019-10-04 ENCOUNTER — Other Ambulatory Visit (INDEPENDENT_AMBULATORY_CARE_PROVIDER_SITE_OTHER): Payer: Medicare Other

## 2019-10-04 DIAGNOSIS — I517 Cardiomegaly: Secondary | ICD-10-CM

## 2019-10-04 DIAGNOSIS — I471 Supraventricular tachycardia: Secondary | ICD-10-CM

## 2019-10-06 ENCOUNTER — Other Ambulatory Visit: Payer: Self-pay | Admitting: Internal Medicine

## 2019-10-27 DIAGNOSIS — I471 Supraventricular tachycardia: Secondary | ICD-10-CM | POA: Diagnosis not present

## 2019-11-03 ENCOUNTER — Telehealth: Payer: Self-pay | Admitting: Internal Medicine

## 2019-11-03 NOTE — Telephone Encounter (Signed)
   Would patient be appropriate for a pneumonia vaccine this year Patient scheduled for flu vaccine 9/17

## 2019-11-04 NOTE — Telephone Encounter (Signed)
Called pt daughter [facility] pt is up-to-date on her pneumonia vaccine. Last once was done back in 2018 not due until 2023.Marland KitchenRaechel Chute

## 2019-11-05 ENCOUNTER — Ambulatory Visit (INDEPENDENT_AMBULATORY_CARE_PROVIDER_SITE_OTHER): Payer: Medicare Other

## 2019-11-05 ENCOUNTER — Other Ambulatory Visit: Payer: Self-pay

## 2019-11-05 DIAGNOSIS — Z23 Encounter for immunization: Secondary | ICD-10-CM | POA: Diagnosis not present

## 2019-11-24 ENCOUNTER — Ambulatory Visit: Payer: Medicare Other | Admitting: Cardiology

## 2019-12-03 ENCOUNTER — Encounter: Payer: Self-pay | Admitting: Podiatry

## 2019-12-03 ENCOUNTER — Other Ambulatory Visit: Payer: Self-pay

## 2019-12-03 ENCOUNTER — Ambulatory Visit (INDEPENDENT_AMBULATORY_CARE_PROVIDER_SITE_OTHER): Payer: Medicare Other | Admitting: Podiatry

## 2019-12-03 DIAGNOSIS — B353 Tinea pedis: Secondary | ICD-10-CM | POA: Diagnosis not present

## 2019-12-03 DIAGNOSIS — M79676 Pain in unspecified toe(s): Secondary | ICD-10-CM | POA: Diagnosis not present

## 2019-12-03 DIAGNOSIS — B351 Tinea unguium: Secondary | ICD-10-CM

## 2019-12-03 DIAGNOSIS — E119 Type 2 diabetes mellitus without complications: Secondary | ICD-10-CM

## 2019-12-03 MED ORDER — CLOTRIMAZOLE-BETAMETHASONE 1-0.05 % EX CREA: 1.0000 "application " | TOPICAL_CREAM | Freq: Two times a day (BID) | CUTANEOUS | 1 refills | Status: DC

## 2019-12-03 NOTE — Patient Instructions (Signed)

## 2019-12-06 NOTE — Progress Notes (Signed)
Subjective: Susan Brady presents today for follow up of preventative diabetic foot care and painful mycotic nails b/l that are difficult to trim. Pain interferes with ambulation. Aggravating factors include wearing enclosed shoe gear. Pain is relieved with periodic professional debridement.   She states she does not feel antifungal solution from [place] is working and would like to discontinue medication. She also feels skin around toenails are peeling and dry.   Allergies  Allergen Reactions  . Lisinopril Anaphylaxis    cough     Objective: There were no vitals filed for this visit.  Pt is a pleasant 74 y.o. year old African American female  in NAD. AAO x 3.   Vascular Examination:  Capillary refill time to digits immediate b/l. Palpable DP pulses b/l. Palpable PT pulses b/l. Pedal hair present b/l. Skin temperature gradient within normal limits b/l. No edema noted b/l.  Dermatological Examination: Pedal skin with normal turgor, texture and tone bilaterally. No open wounds bilaterally. No interdigital macerations bilaterally. Toenails 1-5 b/l elongated, discolored, dystrophic, thickened, crumbly with subungual debris and tenderness to dorsal palpation. Diffuse scaling and peeling noted around tips of digits 1-5 b/l.  No interdigital macerations.  No blisters, no weeping. No signs of secondary bacterial infection noted.  Musculoskeletal: Normal muscle strength 5/5 to all lower extremity muscle groups bilaterally. No gross bony deformities bilaterally. No pain crepitus or joint limitation noted with ROM b/l.  Neurological: Protective sensation intact 5/5 intact bilaterally with 10g monofilament b/l. Vibratory sensation intact b/l. Proprioception intact bilaterally. Babinski reflex negative b/l. Clonus negative b/l.  Assessment: No diagnosis found.   Plan: -No new findings. No new orders. -Continue diabetic foot care principles. -Toenails 1-5 b/l were debrided in length  and girth with sterile nail nippers and dremel without iatrogenic bleeding. Discontinue compounded topical antifungal solution for onychomycosis. -Patient to report any pedal injuries to medical professional immediately. -For tinea pedis, prescription written for Lotrisone Cream 1%/0.05% to be applied to both feet twice daily for 6 weeks. -Patient to continue soft, supportive shoe gear daily. -Patient/POA to call should there be question/concern in the interim.  Return in about 3 months (around 03/04/2020) for diabetic nail trim.  Freddie Breech, DPM

## 2019-12-10 ENCOUNTER — Ambulatory Visit: Payer: Medicare Other | Admitting: Internal Medicine

## 2019-12-31 ENCOUNTER — Ambulatory Visit: Payer: Medicare Other | Admitting: Internal Medicine

## 2020-01-02 NOTE — Patient Instructions (Addendum)
Your last colonoscopy was 2011 - you should see your gastroenterologist and talk about if you should have another one.  I do not know you did your last colonoscopy.  You are due for a tetanus vaccine - I do not know when your last one was - it should be every 10 years.  The best place to get this is a pharmacy - it is not covered by insurance.  If you get cut the insurance will cover it.    Have an eye exam once a year.   Take your lasix in the morning and early afternoon.    Blood work was ordered.        Please followup in 6 months

## 2020-01-02 NOTE — Progress Notes (Signed)
Subjective:    Patient ID: Susan Brady, female    DOB: Jun 19, 1945, 74 y.o.   MRN: [id number]  HPI The patient is here for follow up of their chronic medical problems, including htn,hyperlipidemia, DM, GERD, arthritis pain   She has no concerns.    She is exercising regularly.   She walks her dog.  She walks in the house.    Medications and allergies reviewed with patient and updated if appropriate.  Patient Active Problem List   Diagnosis Date Noted   SVT (supraventricular tachycardia) (HCC) 08/12/2019   Chronic right shoulder pain 05/11/2019   Palpitations 05/11/2019   Pain and swelling of knee, right 03/29/2019   Neck pain, musculoskeletal 03/15/2019   Eczema 03/03/2019   Dizziness 09/22/2018   Lightheadedness 04/24/2018   Chronic diastolic heart failure (HCC) 04/14/2018   Poor balance 12/20/2016   Normal coronary arteries 08/07/2016   Cardiomegaly 08/07/2016   Dupuytren's contracture of right hand 07/19/2016   Hyperlipidemia 06/25/2016   Neck pain on right side 11/14/2015   Poor sleep pattern 11/14/2015   Constipation 11/14/2015   Hiatal hernia, large 08/30/2015   Gastric ulcer 08/30/2015   Gastritis 08/15/2015   Left shoulder pain 06/22/2015   Essential hypertension 11/22/2014   Non-insulin dependent type 2 diabetes mellitus (HCC) 11/22/2014   Arthritis 11/22/2014   Glaucoma 11/22/2014   GERD (gastroesophageal reflux disease) 11/22/2014   Varicose veins 11/22/2014   Myocardial infarct, old 11/22/2014   Superficial thrombophlebitis of lower extremity 11/22/2014    Current Outpatient Medications on File Prior to Visit  Medication Sig Dispense Refill   acetaminophen (TYLENOL) 500 MG tablet Take 500-1,000 mg by mouth every 6 (six) hours as needed for mild pain.     aspirin EC 81 MG tablet Take 1 tablet (81 mg total) by mouth daily.     atorvastatin (LIPITOR) 20 MG tablet TAKE 1 TABLET BY MOUTH DAILY...OFFICE VISIT FOR FURTHER  REFILLS 90 tablet 1   cholecalciferol (VITAMIN D) 1000 UNITS tablet Take 5,000 Units by mouth daily.      clotrimazole-betamethasone (LOTRISONE) cream Apply 1 application topically 2 (two) times daily. Apply between toes and to toenails twice daily for 6 weeks.Use gloves to apply. 45 g 1   flecainide (TAMBOCOR) 50 MG tablet Take 1.5 tablets (75 mg total) by mouth 2 (two) times daily. 60 tablet 3   furosemide (LASIX) 20 MG tablet TAKE 1 TABLET(20 MG) BY MOUTH TWICE DAILY (Patient taking differently: Take 20 mg by mouth 2 (two) times daily. ) 180 tablet 1   Garlic 1000 MG CAPS Take 1,000 mg by mouth daily.     hydrALAZINE (APRESOLINE) 50 MG tablet TAKE 1&1/2 TABLETS BY MOUTH THREE TIMES DAILY. NEED FOLLOW UP FROM MORE REFILLS (Patient taking differently: Take 75 mg by mouth 3 (three) times daily. ) 405 tablet 1   metoprolol tartrate (LOPRESSOR) 25 MG tablet Take 1 tablet (25 mg total) by mouth 2 (two) times daily. 60 tablet 0   Omega-3 Fatty Acids (FISH OIL) 1000 MG CAPS Take 1 capsule by mouth daily.     pantoprazole (PROTONIX) 40 MG tablet TAKE 1 TABLET(40 MG) BY MOUTH DAILY (Patient taking differently: Take 40 mg by mouth daily. ) 90 tablet 1   potassium chloride SA (KLOR-CON) 20 MEQ tablet TAKE 1 TABLET(20 MEQ) BY MOUTH DAILY (Patient taking differently: Take 20 mEq by mouth daily. ) 90 tablet 1   triamcinolone cream (KENALOG) 0.1 % Apply 1 application topically 2 (two) times daily. (Patient  taking differently: Apply 1 application topically daily as needed (hands). ) 30 g 0   vitamin E 400 UNIT capsule Take 1,000 Units by mouth daily.      No current facility-administered medications on file prior to visit.    Past Medical History:  Diagnosis Date   Anemia    CHF (congestive heart failure) (HCC)    Diabetes mellitus without complication (HCC)    Gastric ulcer    Glaucoma    Hiatal hernia    Hyperlipidemia    Hypertension     Past Surgical History:  Procedure  Laterality Date   LEFT HEART CATH AND CORONARY ANGIOGRAPHY N/A 07/25/2016   Procedure: Left Heart Cath and Coronary Angiography;  Surgeon: Kathleene Hazel, MD;  Location: [facility] INVASIVE CV LAB;  Service: Cardiovascular;  Laterality: N/A;    Social History   Socioeconomic History   Marital status: Divorced    Spouse name: Not on file   Number of children: Not on file   Years of education: Not on file   Highest education level: Not on file  Occupational History   Not on file  Tobacco Use   Smoking status: Former Smoker   Smokeless tobacco: Never Used  Building services engineer Use: Never used  Substance and Sexual Activity   Alcohol use: No    Alcohol/week: 0.0 standard drinks   Drug use: No   Sexual activity: Not on file  Other Topics Concern   Not on file  Social History Narrative   Not on file   Social Determinants of Health   Financial Resource Strain: Low Risk    Difficulty of Paying Living Expenses: Not hard at all  Food Insecurity: No Food Insecurity   Worried About Programme researcher, broadcasting/film/video in the Last Year: Never true   Ran Out of Food in the Last Year: Never true  Transportation Needs: No Transportation Needs   Lack of Transportation (Medical): No   Lack of Transportation (Non-Medical): No  Physical Activity: Sufficiently Active   Days of Exercise per Week: 5 days   Minutes of Exercise per Session: 30 min  Stress: No Stress Concern Present   Feeling of Stress : Not at all  Social Connections: Moderately Integrated   Frequency of Communication with Friends and Family: More than three times a week   Frequency of Social Gatherings with Friends and Family: More than three times a week   Attends Religious Services: More than 4 times per year   Active Member of [employer] or Organizations: Yes   Attends Engineer, structural: More than 4 times per year   Marital Status: Never married    Family History  Problem Relation Age of Onset    Heart disease Mother    Alcohol abuse Father    Alcohol abuse Sister    Diabetes Sister    Hypertension Sister    Cancer Sister    CAD Sister        One sister with stents   Alcohol abuse Brother    Diabetes Brother    Hypertension Brother     Review of Systems  Constitutional: Negative for chills and fever.  Respiratory: Negative for cough, shortness of breath and wheezing.   Cardiovascular: Negative for chest pain, palpitations and leg swelling.  Neurological: Positive for light-headedness and headaches (occ).       Objective:   Vitals:   01/03/20 1431  BP: 130/70  Pulse: 69  Temp: 98 F (36.7 C)  SpO2: 94%   BP Readings from Last 3 Encounters:  01/03/20 130/70  01/03/20 130/70  09/24/19 130/80   Wt Readings from Last 3 Encounters:  01/03/20 211 lb (95.7 kg)  01/03/20 211 lb 9.6 oz (96 kg)  09/24/19 209 lb (94.8 kg)   Body mass index is 29.43 kg/m.   Physical Exam    Constitutional: Appears well-developed and well-nourished. No distress.  HENT:  Head: Normocephalic and atraumatic.  Neck: Neck supple. No tracheal deviation present. No thyromegaly present.  No cervical lymphadenopathy Cardiovascular: Normal rate, regular rhythm and normal heart sounds.   No murmur heard. No carotid bruit .  No edema Pulmonary/Chest: Effort normal and breath sounds normal. No respiratory distress. No has no wheezes. No rales.  Skin: Skin is warm and dry. Not diaphoretic.  Psychiatric: Normal mood and affect. Behavior is normal.      Assessment & Plan:    See Problem List for Assessment and Plan of chronic medical problems.    This visit occurred during the SARS-CoV-2 public health emergency.  Safety protocols were in place, including screening questions prior to the visit, additional usage of staff PPE, and extensive cleaning of exam room while observing appropriate contact time as indicated for disinfecting solutions.

## 2020-01-03 ENCOUNTER — Ambulatory Visit (INDEPENDENT_AMBULATORY_CARE_PROVIDER_SITE_OTHER): Payer: Medicare Other | Admitting: Internal Medicine

## 2020-01-03 ENCOUNTER — Ambulatory Visit (INDEPENDENT_AMBULATORY_CARE_PROVIDER_SITE_OTHER): Payer: Medicare Other

## 2020-01-03 ENCOUNTER — Encounter: Payer: Self-pay | Admitting: Internal Medicine

## 2020-01-03 ENCOUNTER — Other Ambulatory Visit: Payer: Self-pay

## 2020-01-03 VITALS — BP 130/70 | HR 69 | Temp 98.0°F | Ht 71.0 in | Wt 211.6 lb

## 2020-01-03 VITALS — BP 130/70 | HR 69 | Temp 98.0°F | Ht 71.0 in | Wt 211.0 lb

## 2020-01-03 DIAGNOSIS — E119 Type 2 diabetes mellitus without complications: Secondary | ICD-10-CM

## 2020-01-03 DIAGNOSIS — I10 Essential (primary) hypertension: Secondary | ICD-10-CM

## 2020-01-03 DIAGNOSIS — E7849 Other hyperlipidemia: Secondary | ICD-10-CM | POA: Diagnosis not present

## 2020-01-03 DIAGNOSIS — K219 Gastro-esophageal reflux disease without esophagitis: Secondary | ICD-10-CM

## 2020-01-03 DIAGNOSIS — Z0000 Encounter for general adult medical examination without abnormal findings: Secondary | ICD-10-CM

## 2020-01-03 NOTE — Assessment & Plan Note (Signed)
Chronic Check a1c, urine microalbumin Low sugar / carb diet Stressed regular exercise

## 2020-01-03 NOTE — Assessment & Plan Note (Signed)
Chronic Check lipid panel  Continue atorvastatin 20 mg daily Regular exercise and healthy diet encouraged  

## 2020-01-03 NOTE — Progress Notes (Signed)
Subjective:   Susan Brady is a 74 y.o. female who presents for Medicare Annual (Subsequent) preventive examination.  Review of Systems    No ROS. Medicare Wellness Visit. Additional risk factors are reflected in social history. Cardiac Risk Factors include: advanced age (>62men, >25 women);dyslipidemia;family history of premature cardiovascular disease;hypertension     Objective:    Today's Vitals   01/03/20 1331  BP: 130/70  Pulse: 69  Temp: 98 F (36.7 C)  SpO2: 94%  Weight: 211 lb 9.6 oz (96 kg)  Height: 5\' 11"  (1.803 m)   Body mass index is 29.51 kg/m.  Advanced Directives 01/03/2020 08/01/2019 12/20/2016 07/23/2016 07/23/2016 08/12/2015 12/08/2014  Does Patient Have a Medical Advance Directive? Yes No No No No No No  Does patient want to make changes to medical advance directive? No - Patient declined - - - - - -  Would patient like information on creating a medical advance directive? - No - Patient declined Yes (ED - Information included in AVS) No - Patient declined - No - patient declined information -    Current Medications (verified) Outpatient Encounter Medications as of 01/03/2020  Medication Sig   acetaminophen (TYLENOL) 500 MG tablet Take 500-1,000 mg by mouth every 6 (six) hours as needed for mild pain.   aspirin EC 81 MG tablet Take 1 tablet (81 mg total) by mouth daily.   atorvastatin (LIPITOR) 20 MG tablet TAKE 1 TABLET BY MOUTH DAILY...OFFICE VISIT FOR FURTHER REFILLS   cholecalciferol (VITAMIN D) 1000 UNITS tablet Take 5,000 Units by mouth daily.    clotrimazole-betamethasone (LOTRISONE) cream Apply 1 application topically 2 (two) times daily. Apply between toes and to toenails twice daily for 6 weeks.Use gloves to apply.   flecainide (TAMBOCOR) 50 MG tablet Take 1.5 tablets (75 mg total) by mouth 2 (two) times daily.   furosemide (LASIX) 20 MG tablet TAKE 1 TABLET(20 MG) BY MOUTH TWICE DAILY (Patient taking differently: Take 20 mg by mouth 2 (two)  times daily. )   Garlic 1000 MG CAPS Take 1,000 mg by mouth daily.   hydrALAZINE (APRESOLINE) 50 MG tablet TAKE 1&1/2 TABLETS BY MOUTH THREE TIMES DAILY. NEED FOLLOW UP FROM MORE REFILLS (Patient taking differently: Take 75 mg by mouth 3 (three) times daily. )   metoprolol tartrate (LOPRESSOR) 25 MG tablet Take 1 tablet (25 mg total) by mouth 2 (two) times daily.   Omega-3 Fatty Acids (FISH OIL) 1000 MG CAPS Take 1 capsule by mouth daily.   pantoprazole (PROTONIX) 40 MG tablet TAKE 1 TABLET(40 MG) BY MOUTH DAILY (Patient taking differently: Take 40 mg by mouth daily. )   potassium chloride SA (KLOR-CON) 20 MEQ tablet TAKE 1 TABLET(20 MEQ) BY MOUTH DAILY (Patient taking differently: Take 20 mEq by mouth daily. )   triamcinolone cream (KENALOG) 0.1 % Apply 1 application topically 2 (two) times daily. (Patient taking differently: Apply 1 application topically daily as needed (hands). )   vitamin E 400 UNIT capsule Take 1,000 Units by mouth daily.    No facility-administered encounter medications on file as of 01/03/2020.    Allergies (verified) Lisinopril   History: Past Medical History:  Diagnosis Date   Anemia    CHF (congestive heart failure) (HCC)    Diabetes mellitus without complication (HCC)    Gastric ulcer    Glaucoma    Hiatal hernia    Hyperlipidemia    Hypertension    Past Surgical History:  Procedure Laterality Date   LEFT HEART CATH AND  CORONARY ANGIOGRAPHY N/A 07/25/2016   Procedure: Left Heart Cath and Coronary Angiography;  Surgeon: Kathleene Hazel, MD;  Location: [facility] INVASIVE CV LAB;  Service: Cardiovascular;  Laterality: N/A;   Family History  Problem Relation Age of Onset   Heart disease Mother    Alcohol abuse Father    Alcohol abuse Sister    Diabetes Sister    Hypertension Sister    Cancer Sister    CAD Sister        One sister with stents   Alcohol abuse Brother    Diabetes Brother    Hypertension Brother    Social  History   Socioeconomic History   Marital status: Divorced    Spouse name: Not on file   Number of children: Not on file   Years of education: Not on file   Highest education level: Not on file  Occupational History   Not on file  Tobacco Use   Smoking status: Former Smoker   Smokeless tobacco: Never Used  Building services engineer Use: Never used  Substance and Sexual Activity   Alcohol use: No    Alcohol/week: 0.0 standard drinks   Drug use: No   Sexual activity: Not on file  Other Topics Concern   Not on file  Social History Narrative   Not on file   Social Determinants of Health   Financial Resource Strain: Low Risk    Difficulty of Paying Living Expenses: Not hard at all  Food Insecurity: No Food Insecurity   Worried About Programme researcher, broadcasting/film/video in the Last Year: Never true   Barista in the Last Year: Never true  Transportation Needs: No Transportation Needs   Lack of Transportation (Medical): No   Lack of Transportation (Non-Medical): No  Physical Activity: Sufficiently Active   Days of Exercise per Week: 5 days   Minutes of Exercise per Session: 30 min  Stress: No Stress Concern Present   Feeling of Stress : Not at all  Social Connections: Moderately Integrated   Frequency of Communication with Friends and Family: More than three times a week   Frequency of Social Gatherings with Friends and Family: More than three times a week   Attends Religious Services: More than 4 times per year   Active Member of [employer] or Organizations: Yes   Attends Engineer, structural: More than 4 times per year   Marital Status: Never married    Tobacco Counseling Counseling given: Not Answered   Clinical Intake:  Pre-visit preparation completed: Yes  Pain : 0-10 Pain Type: Chronic pain Pain Location: Back Pain Orientation: Lower Pain Radiating Towards: bilateral hips Pain Descriptors / Indicators: Discomfort Pain Onset: More than a  month ago Pain Frequency: Intermittent Pain Relieving Factors: Tylenol Effect of Pain on Daily Activities: Pain can lower motivation to exercise and prevent you from completing daily tasks.  Pain Relieving Factors: Tylenol  BMI - recorded: 29.51 Nutritional Status: BMI 25 -29 Overweight Nutritional Risks: None Diabetes: No  How often do you need to have someone help you when you read instructions, pamphlets, or other written materials from your doctor or pharmacy?: 1 - Never What is the last grade level you completed in school?: 10th grade  Diabetic? no  Interpreter Needed?: No  Information entered by :: Susie Cassette, LPN   Activities of Daily Living In your present state of health, do you have any difficulty performing the following activities: 01/03/2020  Hearing? N  Vision? N  Difficulty concentrating or making decisions? Y  Walking or climbing stairs? Y  Comment uses a cane at home or if she has to walk far  Dressing or bathing? N  Doing errands, shopping? N  Preparing Food and eating ? N  Using the Toilet? N  In the past six months, have you accidently leaked urine? N  Do you have problems with loss of bowel control? N  Managing your Medications? Y  Managing your Finances? N  Housekeeping or managing your Housekeeping? N  Some recent data might be hidden    Patient Care Team: Pincus Sanes, MD as PCP - General (Internal Medicine)  Indicate any recent Medical Services you may have received from other than [facility] providers in the past year (date may be approximate).     Assessment:   This is a routine wellness examination for Susan Brady.  Hearing/Vision screen No exam data present  Dietary issues and exercise activities discussed: Current Exercise Habits: Home exercise routine, Type of exercise: walking (walks dog twice a day), Time (Minutes): 30, Frequency (Times/Week): 5, Weekly Exercise (Minutes/Week): 150, Intensity: Moderate, Exercise limited by: orthopedic  condition(s)  Goals     Take time daily to relax and have time for myself      Depression Screen PHQ 2/9 Scores 01/03/2020 03/03/2019 12/20/2016 12/26/2015 11/22/2014  PHQ - 2 Score 1 0 1 0 0  PHQ- 9 Score - - 3 - -    Fall Risk Fall Risk  01/03/2020 03/03/2019 12/20/2016 12/26/2015 08/30/2015  Falls in the past year? 0 0 Yes No No  Number falls in past yr: 0 0 1 - -  Injury with Fall? 0 0 - - -  Risk for fall due to : No Fall Risks - - - -  Follow up Falls evaluation completed;Education provided - - - -    Any stairs in or around the home? No  If so, are there any without handrails? No  Home free of loose throw rugs in walkways, pet beds, electrical cords, etc? Yes  Adequate lighting in your home to reduce risk of falls? Yes   ASSISTIVE DEVICES UTILIZED TO PREVENT FALLS:  Life alert? No  Use of a cane, walker or w/c? Yes  Grab bars in the bathroom? Yes  Shower chair or bench in shower? No  Elevated toilet seat or a handicapped toilet? Yes   TIMED UP AND GO:  Was the test performed? No .  Length of time to ambulate 10 feet: 0 sec.   Gait steady and fast without use of assistive device  Cognitive Function: MMSE - Mini Mental State Exam 12/20/2016 12/20/2016  Not completed: (No Data) Refused     6CIT Screen 01/03/2020  What Year? 0 points  What month? 0 points  What time? 0 points  Count back from 20 0 points  Months in reverse 0 points  Repeat phrase 0 points  Total Score 0    Immunizations Immunization History  Administered Date(s) Administered   Fluad Quad(high Dose 65+) 12/04/2018, 11/05/2019   Influenza, High Dose Seasonal PF 11/14/2015, 12/20/2016, 12/05/2017   Influenza,inj,Quad PF,6+ Mos 11/22/2014   Moderna SARS-COVID-2 Vaccination 03/30/2019, 04/28/2019   Pneumococcal Conjugate-13 03/24/2015   Pneumococcal Polysaccharide-23 12/20/2016    TDAP status: Due, Education has been provided regarding the importance of this vaccine. Advised may receive  this vaccine at local pharmacy or Health Dept. Aware to provide a copy of the vaccination record if obtained from local pharmacy or Health Dept. Verbalized acceptance  and understanding. Flu Vaccine status: Up to date Pneumococcal vaccine status: Up to date Covid-19 vaccine status: Completed vaccines  Qualifies for Shingles Vaccine? Yes   Zostavax completed No   Shingrix Completed?: No.    Education has been provided regarding the importance of this vaccine. Patient has been advised to call insurance company to determine out of pocket expense if they have not yet received this vaccine. Advised may also receive vaccine at local pharmacy or Health Dept. Verbalized acceptance and understanding.  Screening Tests Health Maintenance  Topic Date Due   OPHTHALMOLOGY EXAM  Never done   TETANUS/TDAP  Never done   COLONOSCOPY  08/26/2019   HEMOGLOBIN A1C  08/31/2019   FOOT EXAM  12/02/2020   DEXA SCAN  07/13/2024   INFLUENZA VACCINE  Completed   COVID-19 Vaccine  Completed   Hepatitis C Screening  Completed   PNA vac Low Risk Adult  Completed    Health Maintenance  Health Maintenance Due  Topic Date Due   OPHTHALMOLOGY EXAM  Never done   TETANUS/TDAP  Never done   COLONOSCOPY  08/26/2019   HEMOGLOBIN A1C  08/31/2019    Colorectal cancer screening: Completed 08/25/2009. Repeat every 10 years Mammogram status: Completed 11/18/2018. Repeat every year Bone Density status: Completed 07/14/2019. Results reflect: Bone density results: NORMAL. Repeat every 5 years.  Lung Cancer Screening: (Low Dose CT Chest recommended if Age 75-80 years, 30 pack-year currently smoking OR have quit w/in 15years.) does not qualify.   Lung Cancer Screening Referral: no  Additional Screening:  Hepatitis C Screening: does qualify; Completed: yes  Vision Screening: Recommended annual ophthalmology exams for early detection of glaucoma and other disorders of the eye. Is the patient up to date with  their annual eye exam?  Yes  Who is the provider or what is the name of the office in which the patient attends annual eye exams? [facility]  If pt is not established with a provider, would they like to be referred to a provider to establish care? No .   Dental Screening: Recommended annual dental exams for proper oral hygiene  Community Resource Referral / Chronic Care Management: CRR required this visit?  No   CCM required this visit?  No      Plan:     I have personally reviewed and noted the following in the patients chart:    Medical and social history  Use of alcohol, tobacco or illicit drugs   Current medications and supplements  Functional ability and status  Nutritional status  Physical activity  Advanced directives  List of other physicians  Hospitalizations, surgeries, and ER visits in previous 12 months  Vitals  Screenings to include cognitive, depression, and falls  Referrals and appointments  In addition, I have reviewed and discussed with patient certain preventive protocols, quality metrics, and best practice recommendations. A written personalized care plan for preventive services as well as general preventive health recommendations were provided to patient.     Mickeal NeedyShenika N Preciliano Castell, LPN   16/10/960411/15/2021   Nurse Notes: n/a

## 2020-01-03 NOTE — Assessment & Plan Note (Signed)
Chronic BP well controlled Continue lasix 20 mg bid - take one early morning and then early afternoon, hydralazine 75 mg TID, metoprolol 25 mg BID,  cmp

## 2020-01-03 NOTE — Assessment & Plan Note (Signed)
Chronic GERD controlled Continue protonix 40 mg daily  

## 2020-01-03 NOTE — Patient Instructions (Addendum)
Susan Brady , Thank you for taking time to come for your Medicare Wellness Visit. I appreciate your ongoing commitment to your health goals. Please review the following plan we discussed and let me know if I can assist you in the future.   Screening recommendations/referrals: Colonoscopy: 08/25/2009; no repeat due to age Mammogram: 11/18/2018 Bone Density: 07/14/2019; due every 5 years Recommended yearly ophthalmology/optometry visit for glaucoma screening and checkup Recommended yearly dental visit for hygiene and checkup  Vaccinations: Influenza vaccine: 11/05/2019 Pneumococcal vaccine: up to date Tdap vaccine: never done Shingles vaccine: never done Covid-19: up to date  Advanced directives: Advance directive discussed with you today. I have provided a copy for you to complete at home and have notarized. Once this is complete please bring a copy in to our office so we can scan it into your chart.  Conditions/risks identified: Yes; Reviewed health maintenance screenings with patient today and relevant education, vaccines, and/or referrals were provided. Please continue to do your personal lifestyle choices by: daily care of teeth and gums, regular physical activity (goal should be 5 days a week for 30 minutes), eat a healthy diet, avoid tobacco and drug use, limiting any alcohol intake, taking a low-dose aspirin (if not allergic or have been advised by your provider otherwise) and taking vitamins and minerals as recommended by your provider. Continue doing brain stimulating activities (puzzles, reading, adult coloring books, staying active) to keep memory sharp. Continue to eat heart healthy diet (full of fruits, vegetables, whole grains, lean protein, water--limit salt, fat, and sugar intake) and increase physical activity as tolerated.  Next appointment: Please schedule your next Medicare Wellness Visit with your Nurse Health Advisor in 1 year by calling [phone].  Preventive Care 75 Years  and Older, Female Preventive care refers to lifestyle choices and visits with your health care provider that can promote health and wellness. What does preventive care include?  A yearly physical exam. This is also called an annual well check.  Dental exams once or twice a year.  Routine eye exams. Ask your health care provider how often you should have your eyes checked.  Personal lifestyle choices, including:  Daily care of your teeth and gums.  Regular physical activity.  Eating a healthy diet.  Avoiding tobacco and drug use.  Limiting alcohol use.  Practicing safe sex.  Taking low-dose aspirin every day.  Taking vitamin and mineral supplements as recommended by your health care provider. What happens during an annual well check? The services and screenings done by your health care provider during your annual well check will depend on your age, overall health, lifestyle risk factors, and family history of disease. Counseling  Your health care provider may ask you questions about your:  Alcohol use.  Tobacco use.  Drug use.  Emotional well-being.  Home and relationship well-being.  Sexual activity.  Eating habits.  History of falls.  Memory and ability to understand (cognition).  Work and work Astronomer.  Reproductive health. Screening  You may have the following tests or measurements:  Height, weight, and BMI.  Blood pressure.  Lipid and cholesterol levels. These may be checked every 5 years, or more frequently if you are over 71 years old.  Skin check.  Lung cancer screening. You may have this screening every year starting at age 26 if you have a 30-pack-year history of smoking and currently smoke or have quit within the past 15 years.  Fecal occult blood test (FOBT) of the stool. You may have this  test every year starting at age 25.  Flexible sigmoidoscopy or colonoscopy. You may have a sigmoidoscopy every 5 years or a colonoscopy every 10  years starting at age 37.  Hepatitis C blood test.  Hepatitis B blood test.  Sexually transmitted disease (STD) testing.  Diabetes screening. This is done by checking your blood sugar (glucose) after you have not eaten for a while (fasting). You may have this done every 1-3 years.  Bone density scan. This is done to screen for osteoporosis. You may have this done starting at age 40.  Mammogram. This may be done every 1-2 years. Talk to your health care provider about how often you should have regular mammograms. Talk with your health care provider about your test results, treatment options, and if necessary, the need for more tests. Vaccines  Your health care provider may recommend certain vaccines, such as:  Influenza vaccine. This is recommended every year.  Tetanus, diphtheria, and acellular pertussis (Tdap, Td) vaccine. You may need a Td booster every 10 years.  Zoster vaccine. You may need this after age 43.  Pneumococcal 13-valent conjugate (PCV13) vaccine. One dose is recommended after age 24.  Pneumococcal polysaccharide (PPSV23) vaccine. One dose is recommended after age 43. Talk to your health care provider about which screenings and vaccines you need and how often you need them. This information is not intended to replace advice given to you by your health care provider. Make sure you discuss any questions you have with your health care provider. Document Released: 03/03/2015 Document Revised: 10/25/2015 Document Reviewed: 12/06/2014 Elsevier Interactive Patient Education  2017 [employer].  Fall Prevention in the Home Falls can cause injuries. They can happen to people of all ages. There are many things you can do to make your home safe and to help prevent falls. What can I do on the outside of my home?  Regularly fix the edges of walkways and driveways and fix any cracks.  Remove anything that might make you trip as you walk through a door, such as a raised step or  threshold.  Trim any bushes or trees on the path to your home.  Use bright outdoor lighting.  Clear any walking paths of anything that might make someone trip, such as rocks or tools.  Regularly check to see if handrails are loose or broken. Make sure that both sides of any steps have handrails.  Any raised decks and porches should have guardrails on the edges.  Have any leaves, snow, or ice cleared regularly.  Use sand or salt on walking paths during winter.  Clean up any spills in your garage right away. This includes oil or grease spills. What can I do in the bathroom?  Use night lights.  Install grab bars by the toilet and in the tub and shower. Do not use towel bars as grab bars.  Use non-skid mats or decals in the tub or shower.  If you need to sit down in the shower, use a plastic, non-slip stool.  Keep the floor dry. Clean up any water that spills on the floor as soon as it happens.  Remove soap buildup in the tub or shower regularly.  Attach bath mats securely with double-sided non-slip rug tape.  Do not have throw rugs and other things on the floor that can make you trip. What can I do in the bedroom?  Use night lights.  Make sure that you have a light by your bed that is easy to reach.  Do not use any sheets or blankets that are too big for your bed. They should not hang down onto the floor.  Have a firm chair that has side arms. You can use this for support while you get dressed.  Do not have throw rugs and other things on the floor that can make you trip. What can I do in the kitchen?  Clean up any spills right away.  Avoid walking on wet floors.  Keep items that you use a lot in easy-to-reach places.  If you need to reach something above you, use a strong step stool that has a grab bar.  Keep electrical cords out of the way.  Do not use floor polish or wax that makes floors slippery. If you must use wax, use non-skid floor wax.  Do not have  throw rugs and other things on the floor that can make you trip. What can I do with my stairs?  Do not leave any items on the stairs.  Make sure that there are handrails on both sides of the stairs and use them. Fix handrails that are broken or loose. Make sure that handrails are as long as the stairways.  Check any carpeting to make sure that it is firmly attached to the stairs. Fix any carpet that is loose or worn.  Avoid having throw rugs at the top or bottom of the stairs. If you do have throw rugs, attach them to the floor with carpet tape.  Make sure that you have a light switch at the top of the stairs and the bottom of the stairs. If you do not have them, ask someone to add them for you. What else can I do to help prevent falls?  Wear shoes that:  Do not have high heels.  Have rubber bottoms.  Are comfortable and fit you well.  Are closed at the toe. Do not wear sandals.  If you use a stepladder:  Make sure that it is fully opened. Do not climb a closed stepladder.  Make sure that both sides of the stepladder are locked into place.  Ask someone to hold it for you, if possible.  Clearly mark and make sure that you can see:  Any grab bars or handrails.  First and last steps.  Where the edge of each step is.  Use tools that help you move around (mobility aids) if they are needed. These include:  Canes.  Walkers.  Scooters.  Crutches.  Turn on the lights when you go into a dark area. Replace any light bulbs as soon as they burn out.  Set up your furniture so you have a clear path. Avoid moving your furniture around.  If any of your floors are uneven, fix them.  If there are any pets around you, be aware of where they are.  Review your medicines with your doctor. Some medicines can make you feel dizzy. This can increase your chance of falling. Ask your doctor what other things that you can do to help prevent falls. This information is not intended to  replace advice given to you by your health care provider. Make sure you discuss any questions you have with your health care provider. Document Released: 12/01/2008 Document Revised: 07/13/2015 Document Reviewed: 03/11/2014 Elsevier Interactive Patient Education  2017 [employer].

## 2020-01-04 ENCOUNTER — Other Ambulatory Visit: Payer: Self-pay | Admitting: Internal Medicine

## 2020-01-24 ENCOUNTER — Other Ambulatory Visit: Payer: Self-pay | Admitting: Internal Medicine

## 2020-03-09 ENCOUNTER — Telehealth (INDEPENDENT_AMBULATORY_CARE_PROVIDER_SITE_OTHER): Payer: Medicare Other | Admitting: Internal Medicine

## 2020-03-09 DIAGNOSIS — J399 Disease of upper respiratory tract, unspecified: Secondary | ICD-10-CM

## 2020-03-09 MED ORDER — GUAIFENESIN-CODEINE 100-10 MG/5ML PO SOLN: 5.0000 mL | Freq: Four times a day (QID) | ORAL | 0 refills | Status: DC | PRN

## 2020-03-09 NOTE — Progress Notes (Signed)
Virtual Visit via Telephone Note  I connected with Susan Brady on 03/09/20 at  2:00 PM EST by telephone and verified that I am speaking with the correct person using two identifiers.  Location: Patient: home Provider: [facility]   I discussed the limitations, risks, security and privacy concerns of performing an evaluation and management service by telephone and the availability of in person appointments. I also discussed with the patient that there may be a patient responsible charge related to this service. The patient expressed understanding and agreed to proceed.   History of Present Illness:  C/o URI sx's x 2 d - she needs a cough syrup called in Spoke w/pt and her dtr Susan Brady She just had aCOVID (-) test  Observations/Objective: The patient appears in no acute distress  Assessment and Plan:  See plan Follow Up Instructions:    I discussed the assessment and treatment plan with the patient. The patient was provided an opportunity to ask questions and all were answered. The patient agreed with the plan and demonstrated an understanding of the instructions.   The patient was advised to call back or seek an in-person evaluation if the symptoms worsen or if the condition fails to improve as anticipated.  I provided 12 minutes of non-face-to-face time during this encounter.   Sonda Primes, MD

## 2020-03-13 NOTE — Assessment & Plan Note (Signed)
Spoke with the patient and her daughter.  She is bothered with cough.  She was Covid negative.  Prescribed codeine with guaifenesin cough syrup.  Call if problems

## 2020-04-03 ENCOUNTER — Other Ambulatory Visit: Payer: Self-pay | Admitting: Internal Medicine

## 2020-06-26 DIAGNOSIS — H5203 Hypermetropia, bilateral: Secondary | ICD-10-CM | POA: Diagnosis not present

## 2020-06-26 DIAGNOSIS — H524 Presbyopia: Secondary | ICD-10-CM | POA: Diagnosis not present

## 2020-06-26 DIAGNOSIS — H2513 Age-related nuclear cataract, bilateral: Secondary | ICD-10-CM | POA: Diagnosis not present

## 2020-06-29 ENCOUNTER — Encounter: Payer: Self-pay | Admitting: Internal Medicine

## 2020-06-29 NOTE — Progress Notes (Deleted)
Subjective:    Patient ID: Susan Brady, female    DOB: 1945-12-05, 75 y.o.   MRN: [id number]  HPI The patient is here for an acute visit for neck/back pain and not eating.     Medications and allergies reviewed with patient and updated if appropriate.  Patient Active Problem List   Diagnosis Date Noted  . SVT (supraventricular tachycardia) (HCC) 08/12/2019  . Chronic right shoulder pain 05/11/2019  . Palpitations 05/11/2019  . Pain and swelling of knee, right 03/29/2019  . Neck pain, musculoskeletal 03/15/2019  . Eczema 03/03/2019  . Dizziness 09/22/2018  . Lightheadedness 04/24/2018  . Chronic diastolic heart failure (HCC) 04/14/2018  . Upper respiratory disease 12/24/2017  . Poor balance 12/20/2016  . Normal coronary arteries 08/07/2016  . Cardiomegaly 08/07/2016  . Dupuytren's contracture of right hand 07/19/2016  . Hyperlipidemia 06/25/2016  . Neck pain on right side 11/14/2015  . Poor sleep pattern 11/14/2015  . Constipation 11/14/2015  . Hiatal hernia, large 08/30/2015  . Gastric ulcer 08/30/2015  . Gastritis 08/15/2015  . Left shoulder pain 06/22/2015  . Essential hypertension 11/22/2014  . Non-insulin dependent type 2 diabetes mellitus (HCC) 11/22/2014  . Arthritis 11/22/2014  . Glaucoma 11/22/2014  . GERD (gastroesophageal reflux disease) 11/22/2014  . Varicose veins 11/22/2014  . Myocardial infarct, old 11/22/2014  . Superficial thrombophlebitis of lower extremity 11/22/2014    Current Outpatient Medications on File Prior to Visit  Medication Sig Dispense Refill  . acetaminophen (TYLENOL) 500 MG tablet Take 500-1,000 mg by mouth every 6 (six) hours as needed for mild pain.    Marland Kitchen aspirin EC 81 MG tablet Take 1 tablet (81 mg total) by mouth daily.    Marland Kitchen atorvastatin (LIPITOR) 20 MG tablet TAKE 1 TABLET BY MOUTH DAILY...OFFICE VISIT FOR FURTHER REFILLS 90 tablet 1  . cholecalciferol (VITAMIN D) 1000 UNITS tablet Take 5,000 Units by mouth daily.     .  clotrimazole-betamethasone (LOTRISONE) cream Apply 1 application topically 2 (two) times daily. Apply between toes and to toenails twice daily for 6 weeks.Use gloves to apply. 45 g 1  . flecainide (TAMBOCOR) 50 MG tablet Take 1.5 tablets (75 mg total) by mouth 2 (two) times daily. 60 tablet 3  . furosemide (LASIX) 20 MG tablet TAKE 1 TABLET(20 MG) BY MOUTH TWICE DAILY 180 tablet 1  . Garlic 1000 MG CAPS Take 1,000 mg by mouth daily.    Marland Kitchen guaiFENesin-codeine 100-10 MG/5ML syrup Take 5 mLs by mouth every 6 (six) hours as needed for cough. 200 mL 0  . hydrALAZINE (APRESOLINE) 50 MG tablet TAKE 1 AND 1/2 TABLETS BY MOUTH THREE TIMES DAILY 405 tablet 1  . metoprolol tartrate (LOPRESSOR) 25 MG tablet Take 1 tablet (25 mg total) by mouth 2 (two) times daily. 60 tablet 0  . Omega-3 Fatty Acids (FISH OIL) 1000 MG CAPS Take 1 capsule by mouth daily.    . pantoprazole (PROTONIX) 40 MG tablet TAKE 1 TABLET(40 MG) BY MOUTH DAILY 90 tablet 1  . potassium chloride SA (KLOR-CON) 20 MEQ tablet TAKE 1 TABLET(20 MEQ) BY MOUTH DAILY 90 tablet 1  . triamcinolone cream (KENALOG) 0.1 % Apply 1 application topically 2 (two) times daily. (Patient taking differently: Apply 1 application topically daily as needed (hands). ) 30 g 0  . vitamin E 400 UNIT capsule Take 1,000 Units by mouth daily.      No current facility-administered medications on file prior to visit.    Past Medical History:  Diagnosis Date  .  Anemia   . CHF (congestive heart failure) (HCC)   . Diabetes mellitus without complication (HCC)   . Gastric ulcer   . Glaucoma   . Hiatal hernia   . Hyperlipidemia   . Hypertension     Past Surgical History:  Procedure Laterality Date  . LEFT HEART CATH AND CORONARY ANGIOGRAPHY N/A 07/25/2016   Procedure: Left Heart Cath and Coronary Angiography;  Surgeon: Kathleene Hazel, MD;  Location: [facility] INVASIVE CV LAB;  Service: Cardiovascular;  Laterality: N/A;    Social History   Socioeconomic History  .  Marital status: Divorced    Spouse name: Not on file  . Number of children: Not on file  . Years of education: Not on file  . Highest education level: Not on file  Occupational History  . Not on file  Tobacco Use  . Smoking status: Former Games developer  . Smokeless tobacco: Never Used  Vaping Use  . Vaping Use: Never used  Substance and Sexual Activity  . Alcohol use: No    Alcohol/week: 0.0 standard drinks  . Drug use: No  . Sexual activity: Not on file  Other Topics Concern  . Not on file  Social History Narrative  . Not on file   Social Determinants of Health   Financial Resource Strain: Low Risk   . Difficulty of Paying Living Expenses: Not hard at all  Food Insecurity: No Food Insecurity  . Worried About Programme researcher, broadcasting/film/video in the Last Year: Never true  . Ran Out of Food in the Last Year: Never true  Transportation Needs: No Transportation Needs  . Lack of Transportation (Medical): No  . Lack of Transportation (Non-Medical): No  Physical Activity: Sufficiently Active  . Days of Exercise per Week: 5 days  . Minutes of Exercise per Session: 30 min  Stress: No Stress Concern Present  . Feeling of Stress : Not at all  Social Connections: Moderately Integrated  . Frequency of Communication with Friends and Family: More than three times a week  . Frequency of Social Gatherings with Friends and Family: More than three times a week  . Attends Religious Services: More than 4 times per year  . Active Member of Clubs or Organizations: Yes  . Attends Banker Meetings: More than 4 times per year  . Marital Status: Never married    Family History  Problem Relation Age of Onset  . Heart disease Mother   . Alcohol abuse Father   . Alcohol abuse Sister   . Diabetes Sister   . Hypertension Sister   . Cancer Sister   . CAD Sister        One sister with stents  . Alcohol abuse Brother   . Diabetes Brother   . Hypertension Brother     Review of Systems      Objective:  There were no vitals filed for this visit. BP Readings from Last 3 Encounters:  01/03/20 130/70  01/03/20 130/70  09/24/19 130/80   Wt Readings from Last 3 Encounters:  01/03/20 211 lb (95.7 kg)  01/03/20 211 lb 9.6 oz (96 kg)  09/24/19 209 lb (94.8 kg)   There is no height or weight on file to calculate BMI.   Physical Exam         Assessment & Plan:    See Problem List for Assessment and Plan of chronic medical problems.    This visit occurred during the SARS-CoV-2 public health emergency.  Safety protocols were  in place, including screening questions prior to the visit, additional usage of staff PPE, and extensive cleaning of exam room while observing appropriate contact time as indicated for disinfecting solutions.

## 2020-06-30 ENCOUNTER — Telehealth (INDEPENDENT_AMBULATORY_CARE_PROVIDER_SITE_OTHER): Payer: Medicare Other | Admitting: Internal Medicine

## 2020-06-30 DIAGNOSIS — M545 Low back pain, unspecified: Secondary | ICD-10-CM | POA: Diagnosis not present

## 2020-06-30 DIAGNOSIS — M542 Cervicalgia: Secondary | ICD-10-CM

## 2020-06-30 DIAGNOSIS — R630 Anorexia: Secondary | ICD-10-CM

## 2020-06-30 DIAGNOSIS — M79605 Pain in left leg: Secondary | ICD-10-CM

## 2020-06-30 DIAGNOSIS — J069 Acute upper respiratory infection, unspecified: Secondary | ICD-10-CM

## 2020-06-30 MED ORDER — GABAPENTIN 100 MG PO CAPS: 100.0000 mg | ORAL_CAPSULE | Freq: Every day | ORAL | 3 refills | Status: DC

## 2020-06-30 MED ORDER — MELOXICAM 15 MG PO TABS: 15.0000 mg | ORAL_TABLET | Freq: Every day | ORAL | 0 refills | Status: DC

## 2020-06-30 NOTE — Assessment & Plan Note (Signed)
Acute Symptoms likely viral in nature Home COVID test negative Continue symptomatic treatment with over-the-counter cold medications, Tylenol/ibuprofen Increase rest and fluids Call if symptoms worsen or do not improve

## 2020-06-30 NOTE — Progress Notes (Signed)
Virtual Visit via Video Note  I connected with Susan Brady on 06/30/20 at  8:10 AM EDT by a video enabled telemedicine application and verified that I am speaking with the correct person using two identifiers.   I discussed the limitations of evaluation and management by telemedicine and the availability of in person appointments. The patient expressed understanding and agreed to proceed.  Present for the visit:  Myself, Dr Cheryll Cockayne, Susan Brady and her daughter.  The patient is currently at home and I am in the office.    No referring provider.    History of Present Illness: The patient is here for an acute visit for neck/back pain and not eating.  Lower back pain - it started two days ago.  It started for no reason.  Her and her daughter deny any injuries or falls.  She describes the pain as a burning sensation.  The pain does radiate down her left leg to her calf.  She states there is some numbness and tingling.  There is no rash.  She has been taking Bayer back and body and that has helped some.  Neck pain - started a couple of days - on right side of her neck.  She states it does feel like muscle tightness.  The back and body has been helping a little.  They have not tried any heat or any muscle ointments.  Not eaten, cold symptoms:  She ate breakfast 2 days ago.  Yesterday ate 2 oz of broccoli.  Today she ate 1/2 can of beenie weenies. It does not take good.  She has some nausea.  zofran has helped.  Coughing for last couple of days.    Home covid test neg.    Review of Systems  Constitutional: Negative for chills and fever.       Dec appetite  HENT: Positive for congestion. Negative for ear pain, sinus pain and sore throat.        Food does not taste good  Respiratory: Positive for cough and shortness of breath (chronic - no change). Negative for wheezing.   Gastrointestinal: Positive for nausea.  Musculoskeletal: Positive for back pain (down left leg) and neck pain.   Neurological: Positive for tingling and headaches.      Social History   Socioeconomic History  . Marital status: Divorced    Spouse name: Not on file  . Number of children: Not on file  . Years of education: Not on file  . Highest education level: Not on file  Occupational History  . Not on file  Tobacco Use  . Smoking status: Former Games developer  . Smokeless tobacco: Never Used  Vaping Use  . Vaping Use: Never used  Substance and Sexual Activity  . Alcohol use: No    Alcohol/week: 0.0 standard drinks  . Drug use: No  . Sexual activity: Not on file  Other Topics Concern  . Not on file  Social History Narrative  . Not on file   Social Determinants of Health   Financial Resource Strain: Low Risk   . Difficulty of Paying Living Expenses: Not hard at all  Food Insecurity: No Food Insecurity  . Worried About Programme researcher, broadcasting/film/video in the Last Year: Never true  . Ran Out of Food in the Last Year: Never true  Transportation Needs: No Transportation Needs  . Lack of Transportation (Medical): No  . Lack of Transportation (Non-Medical): No  Physical Activity: Sufficiently Active  . Days of Exercise per  Week: 5 days  . Minutes of Exercise per Session: 30 min  Stress: No Stress Concern Present  . Feeling of Stress : Not at all  Social Connections: Moderately Integrated  . Frequency of Communication with Friends and Family: More than three times a week  . Frequency of Social Gatherings with Friends and Family: More than three times a week  . Attends Religious Services: More than 4 times per year  . Active Member of Clubs or Organizations: Yes  . Attends Banker Meetings: More than 4 times per year  . Marital Status: Never married     Observations/Objective: Appears well in NAD.  Does not appear to be in any discomfort Breathing normally It appears warm and dry    Assessment and Plan:  See Problem List for Assessment and Plan of chronic medical  problems.   Follow Up Instructions:    I discussed the assessment and treatment plan with the patient. The patient was provided an opportunity to ask questions and all were answered. The patient agreed with the plan and demonstrated an understanding of the instructions.   The patient was advised to call back or seek an in-person evaluation if the symptoms worsen or if the condition fails to improve as anticipated.    Pincus Sanes, MD

## 2020-06-30 NOTE — Assessment & Plan Note (Signed)
Acute Started 2 days ago She states it does feel muscular in nature Back and body has helped some Will be starting meloxicam 50 mg daily with food and hopefully that will help Advised heat, topical muscle/arthritis medications Call if no improvement

## 2020-06-30 NOTE — Assessment & Plan Note (Signed)
Acute She has not been eating much these past couple of days, which coincides with her symptoms She states nothing tastes well COVID test negative Encouraged her to make sure she is drinking plenty and try to force herself to eat something so that she does not get weak If there is no improvement they will let me know

## 2020-06-30 NOTE — Assessment & Plan Note (Signed)
Acute Started 2 days ago without obvious cause-no injury or fall Burning type pain radiating down to left calf.  There is some tingling.?  Weakness No rash Probable pinched nerve We will start meloxicam 15 mg daily with food-hold bareback and body Start gabapentin 100 mg at bedtime.  Discussed possible side effects with her daughter and she will monitor Deferred prednisone They will let me know if there is no improvement so we can consider other medication, referral to physical therapy or sports medicine

## 2020-07-02 ENCOUNTER — Other Ambulatory Visit: Payer: Self-pay | Admitting: Internal Medicine

## 2020-07-03 ENCOUNTER — Telehealth: Payer: Self-pay | Admitting: Internal Medicine

## 2020-07-03 ENCOUNTER — Ambulatory Visit: Payer: Medicare Other | Admitting: Internal Medicine

## 2020-07-03 NOTE — Telephone Encounter (Signed)
Spoke with daughter today 

## 2020-07-03 NOTE — Telephone Encounter (Signed)
Patients daughter called and said that the patient tested positive for Covid 19 on Saturday. She said that she has not given the patient the medication that was prescribed because she thinks they are just symptoms of Covid. Please advise.

## 2020-07-10 DIAGNOSIS — Z01818 Encounter for other preprocedural examination: Secondary | ICD-10-CM | POA: Diagnosis not present

## 2020-07-10 DIAGNOSIS — H11001 Unspecified pterygium of right eye: Secondary | ICD-10-CM | POA: Diagnosis not present

## 2020-07-26 DIAGNOSIS — H11001 Unspecified pterygium of right eye: Secondary | ICD-10-CM | POA: Diagnosis not present

## 2020-08-04 ENCOUNTER — Other Ambulatory Visit: Payer: Self-pay | Admitting: Internal Medicine

## 2020-08-17 ENCOUNTER — Other Ambulatory Visit: Payer: Self-pay | Admitting: Cardiology

## 2020-08-18 ENCOUNTER — Other Ambulatory Visit: Payer: Self-pay | Admitting: Cardiovascular Disease

## 2020-08-18 ENCOUNTER — Other Ambulatory Visit: Payer: Self-pay | Admitting: Cardiology

## 2020-08-26 ENCOUNTER — Encounter: Payer: Self-pay | Admitting: Internal Medicine

## 2020-08-26 NOTE — Progress Notes (Signed)
Subjective:    Patient ID: Susan Brady, female    DOB: [birth year], 75 y.o.   MRN: [id number]  HPI The patient is here for follow up of their chronic medical problems, including htn, hld, DM, gerd, OA, back pain with radiculopathy  Eczema or psoriasis.  She continues to have skin issues.  Nothing is working-she is tried several different things including steroid creams and moisturizer lotions.  She has seen derm .  Medications and allergies reviewed with patient and updated if appropriate.  Patient Active Problem List   Diagnosis Date Noted   Low back pain radiating to left lower extremity 06/30/2020   SVT (supraventricular tachycardia) (HCC) 08/12/2019   Chronic right shoulder pain 05/11/2019   Palpitations 05/11/2019   Pain and swelling of knee, right 03/29/2019   Musculoskeletal neck pain 03/15/2019   Eczema 03/03/2019   Dizziness 09/22/2018   Lightheadedness 04/24/2018   Chronic diastolic heart failure (HCC) 04/14/2018   URI (upper respiratory infection) 12/24/2017   Poor balance 12/20/2016   Normal coronary arteries 08/07/2016   Cardiomegaly 08/07/2016   Dupuytren's contracture of right hand 07/19/2016   Hyperlipidemia 06/25/2016   Neck pain on right side 11/14/2015   Poor sleep pattern 11/14/2015   Constipation 11/14/2015   Hiatal hernia, large 08/30/2015   Gastric ulcer 08/30/2015   Decreased appetite 08/30/2015   Gastritis 08/15/2015   Left shoulder pain 06/22/2015   Essential hypertension 11/22/2014   Non-insulin dependent type 2 diabetes mellitus (HCC) 11/22/2014   Arthritis 11/22/2014   Glaucoma 11/22/2014   GERD (gastroesophageal reflux disease) 11/22/2014   Varicose veins 11/22/2014   Myocardial infarct, old 11/22/2014   Superficial thrombophlebitis of lower extremity 11/22/2014    Current Outpatient Medications on File Prior to Visit  Medication Sig Dispense Refill   acetaminophen (TYLENOL) 500 MG tablet Take 500-1,000 mg by mouth every 6 (six)  hours as needed for mild pain.     aspirin EC 81 MG tablet Take 1 tablet (81 mg total) by mouth daily.     atorvastatin (LIPITOR) 20 MG tablet TAKE 1 TABLET BY MOUTH DAILY...OFFICE VISIT FOR FURTHER REFILLS 90 tablet 1   cholecalciferol (VITAMIN D) 1000 UNITS tablet Take 5,000 Units by mouth daily.      clotrimazole-betamethasone (LOTRISONE) cream Apply 1 application topically 2 (two) times daily. Apply between toes and to toenails twice daily for 6 weeks.Use gloves to apply. 45 g 1   flecainide (TAMBOCOR) 150 MG tablet Take 0.5 tablets (75 mg total) by mouth 2 (two) times daily. 90 tablet 2   flecainide (TAMBOCOR) 50 MG tablet Take 1.5 tablets (75 mg total) by mouth 2 (two) times daily. 60 tablet 3   furosemide (LASIX) 20 MG tablet TAKE 1 TABLET(20 MG) BY MOUTH TWICE DAILY 180 tablet 1   gabapentin (NEURONTIN) 100 MG capsule Take 1 capsule (100 mg total) by mouth at bedtime. 30 capsule 3   Garlic 1000 MG CAPS Take 1,000 mg by mouth daily.     hydrALAZINE (APRESOLINE) 50 MG tablet TAKE 1 AND 1/2 TABLETS BY MOUTH THREE TIMES DAILY 405 tablet 1   meloxicam (MOBIC) 15 MG tablet Take 1 tablet (15 mg total) by mouth daily. 30 tablet 0   metoprolol tartrate (LOPRESSOR) 25 MG tablet Take 1 tablet (25 mg total) by mouth 2 (two) times daily. 60 tablet 0   moxifloxacin (VIGAMOX) 0.5 % ophthalmic solution Place into the right eye.     Omega-3 Fatty Acids (FISH OIL) 1000 MG CAPS Take 1  capsule by mouth daily.     pantoprazole (PROTONIX) 40 MG tablet TAKE 1 TABLET(40 MG) BY MOUTH DAILY 90 tablet 1   potassium chloride SA (KLOR-CON) 20 MEQ tablet TAKE 1 TABLET(20 MEQ) BY MOUTH DAILY 90 tablet 1   prednisoLONE acetate (PRED FORTE) 1 % ophthalmic suspension Place 1 drop into the right eye 3 (three) times daily.     triamcinolone cream (KENALOG) 0.1 % Apply 1 application topically 2 (two) times daily. (Patient taking differently: Apply 1 application topically daily as needed (hands).) 30 g 0   vitamin E 400 UNIT  capsule Take 1,000 Units by mouth daily.      No current facility-administered medications on file prior to visit.    Past Medical History:  Diagnosis Date   Anemia    CHF (congestive heart failure) (HCC)    Diabetes mellitus without complication (HCC)    Gastric ulcer    Glaucoma    Hiatal hernia    Hyperlipidemia    Hypertension     Past Surgical History:  Procedure Laterality Date   LEFT HEART CATH AND CORONARY ANGIOGRAPHY N/A 07/25/2016   Procedure: Left Heart Cath and Coronary Angiography;  Surgeon: Kathleene Hazel, MD;  Location: [facility];  Service: Cardiovascular;  Laterality: N/A;    Social History   Socioeconomic History   Marital status: Divorced    Spouse name: Not on file   Number of children: Not on file   Years of education: Not on file   Highest education level: Not on file  Occupational History   Not on file  Tobacco Use   Smoking status: Former    Pack years: 0.00   Smokeless tobacco: Never  Vaping Use   Vaping Use: Never used  Substance and Sexual Activity   Alcohol use: No    Alcohol/week: 0.0 standard drinks   Drug use: No   Sexual activity: Not on file  Other Topics Concern   Not on file  Social History Narrative   Not on file   Social Determinants of Health   Financial Resource Strain: Low Risk    Difficulty of Paying Living Expenses: Not hard at all  Food Insecurity: No Food Insecurity   Worried About Programme researcher, broadcasting/film/video in the Last Year: Never true   Ran Out of Food in the Last Year: Never true  Transportation Needs: No Transportation Needs   Lack of Transportation (Medical): No   Lack of Transportation (Non-Medical): No  Physical Activity: Sufficiently Active   Days of Exercise per Week: 5 days   Minutes of Exercise per Session: 30 min  Stress: No Stress Concern Present   Feeling of Stress : Not at all  Social Connections: Moderately Integrated   Frequency of Communication with Friends and Family: More than  three times a week   Frequency of Social Gatherings with Friends and Family: More than three times a week   Attends Religious Services: More than 4 times per year   Active Member of [employer] or Organizations: Yes   Attends Engineer, structural: More than 4 times per year   Marital Status: Never married    Family History  Problem Relation Age of Onset   Heart disease Mother    Alcohol abuse Father    Alcohol abuse Sister    Diabetes Sister    Hypertension Sister    Cancer Sister    CAD Sister        One sister with stents  Alcohol abuse Brother    Diabetes Brother    Hypertension Brother     Review of Systems  Constitutional:  Negative for chills and fever.  Eyes:  Negative for visual disturbance.  Respiratory:  Positive for shortness of breath (mild with walking around). Negative for cough and wheezing.   Cardiovascular:  Negative for chest pain, palpitations and leg swelling.  Skin:  Positive for rash (elbows, knees, palms).  Neurological:  Positive for dizziness (with quick movements) and headaches (intermittent on left side of head - improves with taking glasses off).      Objective:   Vitals:   08/28/20 0946  BP: 140/90  Pulse: 64  Temp: 98.4 F (36.9 C)  SpO2: 96%   BP Readings from Last 3 Encounters:  08/28/20 140/90  01/03/20 130/70  01/03/20 130/70   Wt Readings from Last 3 Encounters:  08/28/20 206 lb 9.6 oz (93.7 kg)  01/03/20 211 lb (95.7 kg)  01/03/20 211 lb 9.6 oz (96 kg)   Body mass index is 28.81 kg/m.   Physical Exam    Constitutional: Appears well-developed and well-nourished. No distress.  HENT:  Head: Normocephalic and atraumatic.  Neck: Neck supple. No tracheal deviation present. No thyromegaly present.  No cervical lymphadenopathy Cardiovascular: Normal rate, regular rhythm and normal heart sounds.   No murmur heard. No carotid bruit .  No edema Pulmonary/Chest: Effort normal and breath sounds normal. No respiratory distress.  No has no wheezes. No rales.  Skin: Skin is warm and dry. Not diaphoretic.  Dry, peeling skin on anterior hands, hyperpigmented, rough spots bilateral elbows Psychiatric: Normal mood and affect. Behavior is normal.      Assessment & Plan:    See Problem List for Assessment and Plan of chronic medical problems.    This visit occurred during the SARS-CoV-2 public health emergency.  Safety protocols were in place, including screening questions prior to the visit, additional usage of staff PPE, and extensive cleaning of exam room while observing appropriate contact time as indicated for disinfecting solutions.

## 2020-08-26 NOTE — Patient Instructions (Addendum)
  Blood work was ordered.     Medications changes include :   none   A referral was ordered for [facility].      Someone from their office will call you to schedule an appointment.    Please followup in 6 months    You may be due for a colonoscopy - call [facility] and see if you are due --- Phone: [phone]

## 2020-08-28 ENCOUNTER — Ambulatory Visit (INDEPENDENT_AMBULATORY_CARE_PROVIDER_SITE_OTHER): Payer: Medicare Other | Admitting: Internal Medicine

## 2020-08-28 ENCOUNTER — Other Ambulatory Visit: Payer: Self-pay

## 2020-08-28 VITALS — BP 140/90 | HR 64 | Temp 98.4°F | Ht 71.0 in | Wt 206.6 lb

## 2020-08-28 DIAGNOSIS — I10 Essential (primary) hypertension: Secondary | ICD-10-CM | POA: Diagnosis not present

## 2020-08-28 DIAGNOSIS — K219 Gastro-esophageal reflux disease without esophagitis: Secondary | ICD-10-CM | POA: Diagnosis not present

## 2020-08-28 DIAGNOSIS — E7849 Other hyperlipidemia: Secondary | ICD-10-CM | POA: Diagnosis not present

## 2020-08-28 DIAGNOSIS — L309 Dermatitis, unspecified: Secondary | ICD-10-CM | POA: Diagnosis not present

## 2020-08-28 DIAGNOSIS — E119 Type 2 diabetes mellitus without complications: Secondary | ICD-10-CM

## 2020-08-28 DIAGNOSIS — M79605 Pain in left leg: Secondary | ICD-10-CM | POA: Diagnosis not present

## 2020-08-28 DIAGNOSIS — M545 Low back pain, unspecified: Secondary | ICD-10-CM | POA: Diagnosis not present

## 2020-08-28 LAB — LIPID PANEL
Cholesterol: 142 mg/dL (ref 0–200)
HDL: 68.1 mg/dL (ref >39.00)
LDL Cholesterol: 61 mg/dL (ref 0–99)
NonHDL: 73.96
Total CHOL/HDL Ratio: 2
Triglycerides: 66 mg/dL (ref 0.0–149.0)
VLDL: 13.2 mg/dL (ref 0.0–40.0)

## 2020-08-28 LAB — HEMOGLOBIN A1C: Hgb A1c MFr Bld: 5.9 % (ref 4.6–6.5)

## 2020-08-28 LAB — MICROALBUMIN / CREATININE URINE RATIO
Creatinine,U: 149.8 mg/dL
Microalb Creat Ratio: 2 mg/g (ref 0.0–30.0)
Microalb, Ur: 2.9 mg/dL — ABNORMAL HIGH (ref 0.0–1.9)

## 2020-08-28 LAB — CBC WITH DIFFERENTIAL/PLATELET
Basophils Absolute: 0 10*3/uL (ref 0.0–0.1)
Basophils Relative: 0.5 % (ref 0.0–3.0)
Eosinophils Absolute: 0.3 10*3/uL (ref 0.0–0.7)
Eosinophils Relative: 5 % (ref 0.0–5.0)
HCT: 37.2 % (ref 36.0–46.0)
Hemoglobin: 12.2 g/dL (ref 12.0–15.0)
Lymphocytes Relative: 36.1 % (ref 12.0–46.0)
Lymphs Abs: 2.1 10*3/uL (ref 0.7–4.0)
MCHC: 32.9 g/dL (ref 30.0–36.0)
MCV: 88.5 fl (ref 78.0–100.0)
Monocytes Absolute: 0.7 10*3/uL (ref 0.1–1.0)
Monocytes Relative: 11.7 % (ref 3.0–12.0)
Neutro Abs: 2.7 10*3/uL (ref 1.4–7.7)
Neutrophils Relative %: 46.7 % (ref 43.0–77.0)
Platelets: 305 10*3/uL (ref 150.0–400.0)
RBC: 4.2 Mil/uL (ref 3.87–5.11)
RDW: 14.2 % (ref 11.5–15.5)
WBC: 5.7 10*3/uL (ref 4.0–10.5)

## 2020-08-28 LAB — COMPREHENSIVE METABOLIC PANEL
ALT: 12 U/L (ref 0–35)
AST: 18 U/L (ref 0–37)
Albumin: 4.4 g/dL (ref 3.5–5.2)
Alkaline Phosphatase: 54 U/L (ref 39–117)
BUN: 14 mg/dL (ref 6–23)
CO2: 30 mEq/L (ref 19–32)
Calcium: 9.3 mg/dL (ref 8.4–10.5)
Chloride: 106 mEq/L (ref 96–112)
Creatinine, Ser: 0.68 mg/dL (ref 0.40–1.20)
GFR: 84.93 mL/min (ref >60.00)
Glucose, Bld: 104 mg/dL — ABNORMAL HIGH (ref 70–99)
Potassium: 4.1 mEq/L (ref 3.5–5.1)
Sodium: 142 mEq/L (ref 135–145)
Total Bilirubin: 0.5 mg/dL (ref 0.2–1.2)
Total Protein: 6.9 g/dL (ref 6.0–8.3)

## 2020-08-28 MED ORDER — ATORVASTATIN CALCIUM 20 MG PO TABS: ORAL_TABLET | ORAL | 1 refills | Status: DC

## 2020-08-28 NOTE — Assessment & Plan Note (Signed)
Chronic Controlled Continue pantoprazole 40 mg daily 

## 2020-08-28 NOTE — Assessment & Plan Note (Signed)
Chronic Diet controlled Check A1c, urine microalbumin 

## 2020-08-28 NOTE — Assessment & Plan Note (Signed)
Chronic BP Readings from Last 3 Encounters:  08/28/20 140/90  01/03/20 130/70  01/03/20 130/70   Blood pressure well controlled Continue furosemide 20 mg twice daily, hydralazine 75 mg 3 times daily, metoprolol 25 mg twice daily CMP

## 2020-08-28 NOTE — Assessment & Plan Note (Signed)
Chronic Check lipid panel  Continue atorvastatin 20 mg daily   

## 2020-08-28 NOTE — Assessment & Plan Note (Signed)
Improved No longer taking meloxicam or gabapentin

## 2020-08-28 NOTE — Assessment & Plan Note (Signed)
?    Eczema versus psoriasis She has seen dermatology in the recent past for this and was prescribed something, but she cannot tell me what she was prescribed-it did not help He has tried moisturizing lotions and triamcinolone without improvement Dermatology note not available for review She is interested in seeing a different dermatologist for another opinion Referred today

## 2020-09-27 DIAGNOSIS — L301 Dyshidrosis [pompholyx]: Secondary | ICD-10-CM | POA: Diagnosis not present

## 2020-09-27 DIAGNOSIS — L858 Other specified epidermal thickening: Secondary | ICD-10-CM | POA: Diagnosis not present

## 2020-09-30 ENCOUNTER — Other Ambulatory Visit: Payer: Self-pay | Admitting: Internal Medicine

## 2020-09-30 ENCOUNTER — Other Ambulatory Visit: Payer: Self-pay | Admitting: Cardiology

## 2020-10-18 IMAGING — DX DG KNEE COMPLETE 4+V*R*
4 series · 4 of 4 positions shown · non-contrast
Comparison: None.

CLINICAL DATA: Chronic knee pain and swelling.

EXAM:
RIGHT KNEE - COMPLETE 4+ VIEW

[knee ap]
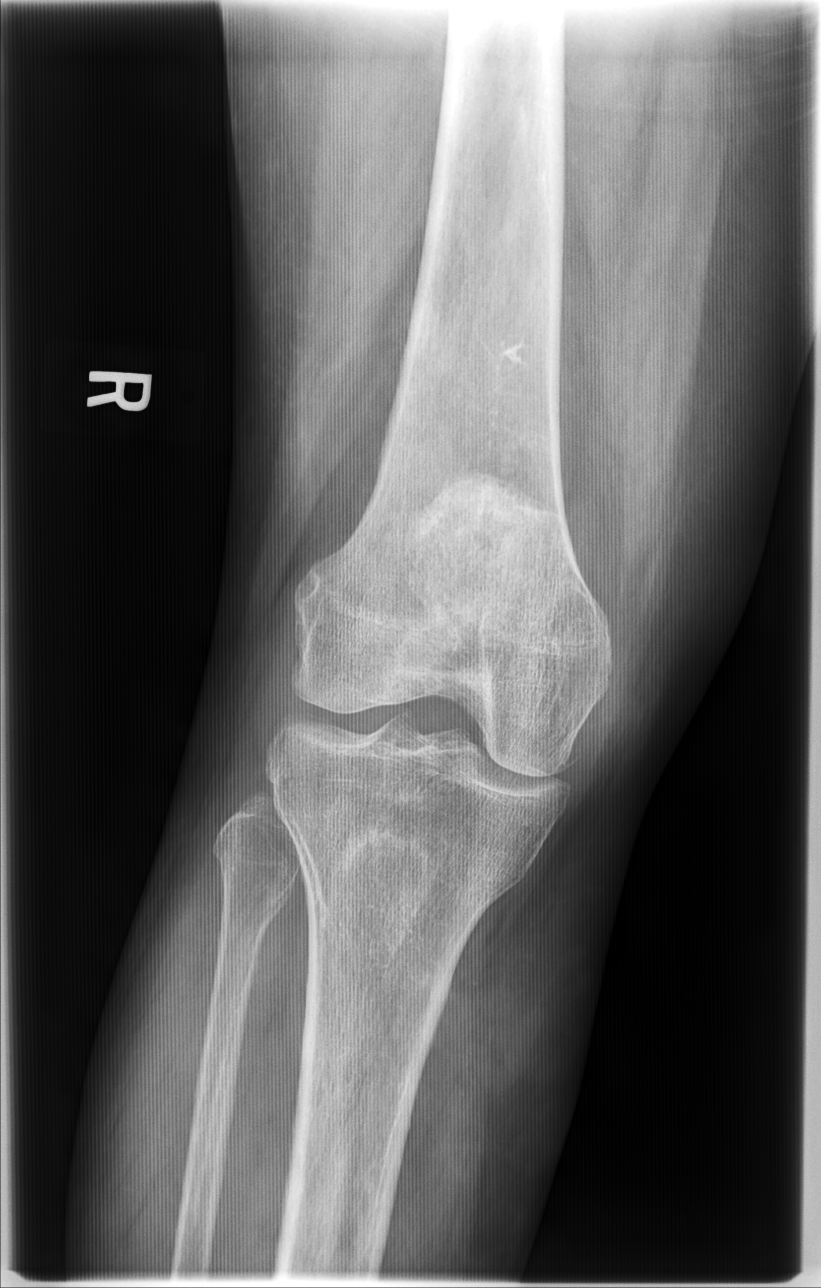

[knee lat]
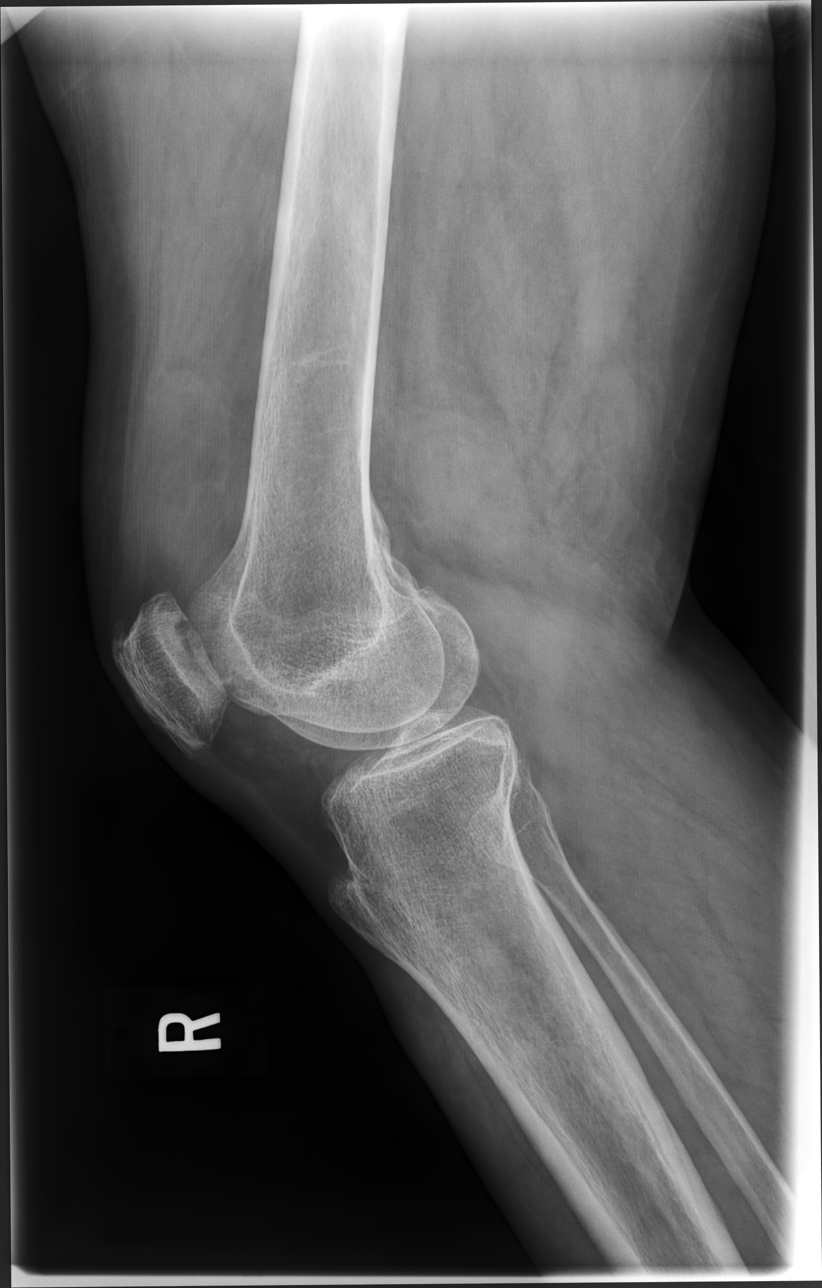

[knee mlo (1 of 2)]
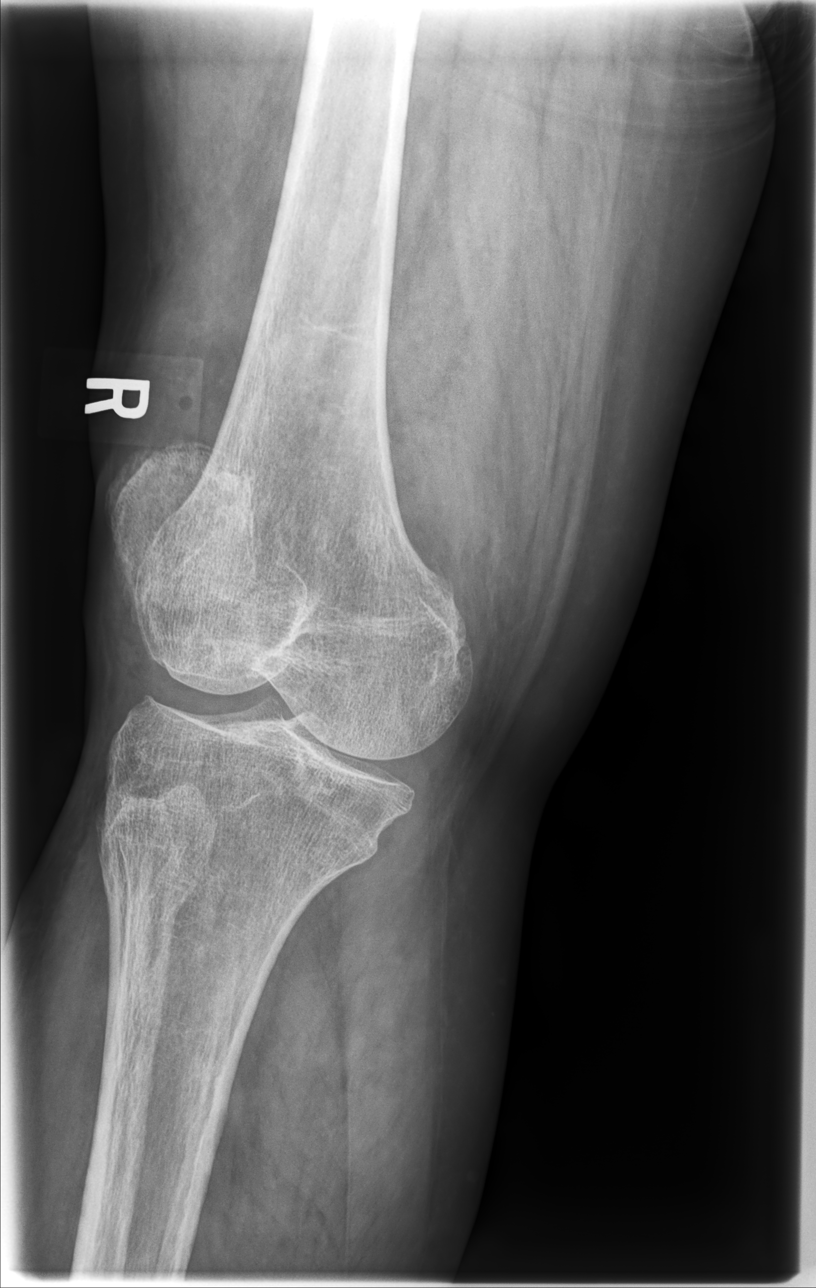

[knee mlo (2 of 2)]
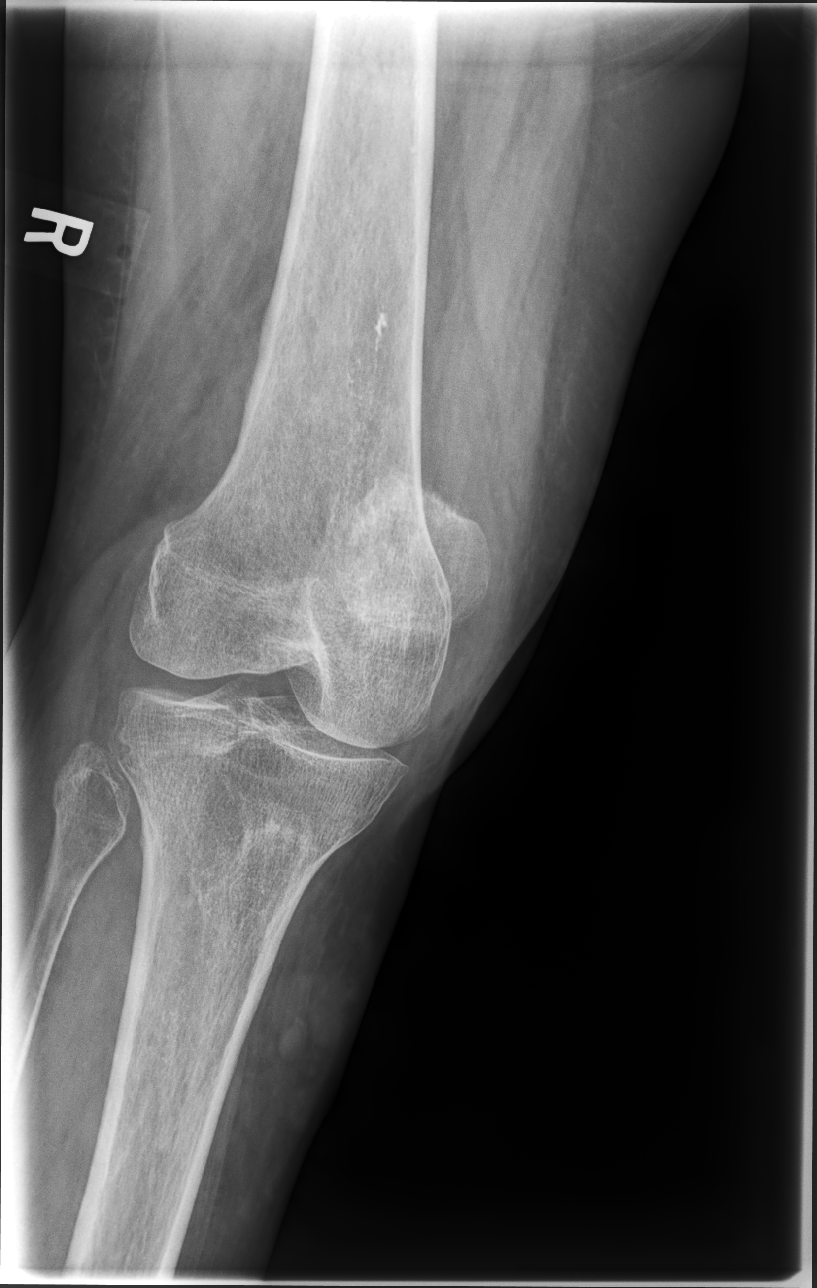

[4 of 4 positions shown; findings below may reference images not displayed]

FINDINGS: No acute fracture or dislocation. Moderate joint effusion. Mild
medial compartment joint space narrowing with small marginal
osteophytes. Subchondral lucency in the patella, likely degenerative
cysts. Osteopenia. Soft tissues are unremarkable.
IMPRESSION: 1. Mild medial compartment osteoarthritis.
2. Moderate joint effusion.

## 2020-11-08 ENCOUNTER — Other Ambulatory Visit: Payer: Self-pay | Admitting: Cardiology

## 2020-12-05 ENCOUNTER — Encounter: Payer: Self-pay | Admitting: Internal Medicine

## 2020-12-05 NOTE — Progress Notes (Signed)
Subjective:    Patient ID: Susan Brady, female    DOB: 1945/07/21, 75 y.o.   MRN: [id number]  This visit occurred during the SARS-CoV-2 public health emergency.  Safety protocols were in place, including screening questions prior to the visit, additional usage of staff PPE, and extensive cleaning of exam room while observing appropriate contact time as indicated for disinfecting solutions.    HPI The patient is here for an acute visit.   Left side / stomach pain  - the pain is on her left side for about two months.  It is intermittent.  Recently it has been worse.  The pain may last 15 minutes.   She thinks it is mostly worse when laying down.  She has not tried taking anything for her pain.  She does not have a BM daily.  She can sometimes go 2-3 days.  She can not pass gas like she used to - when she does it does improve the pain.  She takes metamucil. Having a BM does improve the pain.  She drinks about 4 bottles of water a day.  She eats a good amount fruits.        Medications and allergies reviewed with patient and updated if appropriate.  Patient Active Problem List   Diagnosis Date Noted   Low back pain radiating to left lower extremity 06/30/2020   SVT (supraventricular tachycardia) (HCC) 08/12/2019   Chronic right shoulder pain 05/11/2019   Palpitations 05/11/2019   Pain and swelling of knee, right 03/29/2019   Musculoskeletal neck pain 03/15/2019   Eczema 03/03/2019   Dizziness 09/22/2018   Lightheadedness 04/24/2018   Chronic diastolic heart failure (HCC) 04/14/2018   Poor balance 12/20/2016   Normal coronary arteries 08/07/2016   Cardiomegaly 08/07/2016   Dupuytren's contracture of right hand 07/19/2016   Hyperlipidemia 06/25/2016   Neck pain on right side 11/14/2015   Poor sleep pattern 11/14/2015   Constipation 11/14/2015   Hiatal hernia, large 08/30/2015   Gastric ulcer 08/30/2015   Decreased appetite 08/30/2015   Gastritis 08/15/2015   Left  shoulder pain 06/22/2015   Essential hypertension 11/22/2014   Non-insulin dependent type 2 diabetes mellitus (HCC) 11/22/2014   Arthritis 11/22/2014   Glaucoma 11/22/2014   GERD (gastroesophageal reflux disease) 11/22/2014   Varicose veins 11/22/2014   Myocardial infarct, old 11/22/2014   Superficial thrombophlebitis of lower extremity 11/22/2014    Current Outpatient Medications on File Prior to Visit  Medication Sig Dispense Refill   acetaminophen (TYLENOL) 500 MG tablet Take 500-1,000 mg by mouth every 6 (six) hours as needed for mild pain.     aspirin EC 81 MG tablet Take 1 tablet (81 mg total) by mouth daily.     atorvastatin (LIPITOR) 20 MG tablet TAKE 1 TABLET BY MOUTH DAILY 90 tablet 1   cholecalciferol (VITAMIN D) 1000 UNITS tablet Take 5,000 Units by mouth daily.      clotrimazole-betamethasone (LOTRISONE) cream Apply 1 application topically 2 (two) times daily. Apply between toes and to toenails twice daily for 6 weeks.Use gloves to apply. 45 g 1   flecainide (TAMBOCOR) 150 MG tablet Take 0.5 tablets (75 mg total) by mouth 2 (two) times daily. 90 tablet 2   furosemide (LASIX) 20 MG tablet TAKE 1 TABLET(20 MG) BY MOUTH TWICE DAILY 180 tablet 1   Garlic 1000 MG CAPS Take 1,000 mg by mouth daily.     hydrALAZINE (APRESOLINE) 50 MG tablet TAKE 1 AND 1/2 TABLETS BY MOUTH THREE TIMES  DAILY 405 tablet 1   metoprolol tartrate (LOPRESSOR) 25 MG tablet Take 1 tablet (25 mg total) by mouth 2 (two) times daily. 60 tablet 0   Omega-3 Fatty Acids (FISH OIL) 1000 MG CAPS Take 1 capsule by mouth daily.     pantoprazole (PROTONIX) 40 MG tablet TAKE 1 TABLET(40 MG) BY MOUTH DAILY 90 tablet 1   potassium chloride SA (KLOR-CON) 20 MEQ tablet TAKE 1 TABLET(20 MEQ) BY MOUTH DAILY 90 tablet 1   triamcinolone cream (KENALOG) 0.1 % Apply 1 application topically 2 (two) times daily. (Patient taking differently: Apply 1 application topically daily as needed (hands).) 30 g 0   vitamin E 400 UNIT capsule  Take 1,000 Units by mouth daily.      No current facility-administered medications on file prior to visit.    Past Medical History:  Diagnosis Date   Anemia    CHF (congestive heart failure) (HCC)    Diabetes mellitus without complication (HCC)    Gastric ulcer    Glaucoma    Hiatal hernia    Hyperlipidemia    Hypertension     Past Surgical History:  Procedure Laterality Date   LEFT HEART CATH AND CORONARY ANGIOGRAPHY N/A 07/25/2016   Procedure: Left Heart Cath and Coronary Angiography;  Surgeon: Kathleene Hazel, MD;  Location: [facility];  Service: Cardiovascular;  Laterality: N/A;    Social History   Socioeconomic History   Marital status: Divorced    Spouse name: Not on file   Number of children: Not on file   Years of education: Not on file   Highest education level: Not on file  Occupational History   Not on file  Tobacco Use   Smoking status: Former   Smokeless tobacco: Never  Vaping Use   Vaping Use: Never used  Substance and Sexual Activity   Alcohol use: No    Alcohol/week: 0.0 standard drinks   Drug use: No   Sexual activity: Not on file  Other Topics Concern   Not on file  Social History Narrative   Not on file   Social Determinants of Health   Financial Resource Strain: Low Risk    Difficulty of Paying Living Expenses: Not hard at all  Food Insecurity: No Food Insecurity   Worried About Programme researcher, broadcasting/film/video in the Last Year: Never true   Ran Out of Food in the Last Year: Never true  Transportation Needs: No Transportation Needs   Lack of Transportation (Medical): No   Lack of Transportation (Non-Medical): No  Physical Activity: Sufficiently Active   Days of Exercise per Week: 5 days   Minutes of Exercise per Session: 30 min  Stress: No Stress Concern Present   Feeling of Stress : Not at all  Social Connections: Moderately Integrated   Frequency of Communication with Friends and Family: More than three times a week   Frequency  of Social Gatherings with Friends and Family: More than three times a week   Attends Religious Services: More than 4 times per year   Active Member of [employer] or Organizations: Yes   Attends Engineer, structural: More than 4 times per year   Marital Status: Never married    Family History  Problem Relation Age of Onset   Heart disease Mother    Alcohol abuse Father    Alcohol abuse Sister    Diabetes Sister    Hypertension Sister    Cancer Sister    CAD Sister  One sister with stents   Alcohol abuse Brother    Diabetes Brother    Hypertension Brother     Review of Systems  Constitutional:  Positive for appetite change (dec). Negative for fever.  Gastrointestinal:  Positive for abdominal pain and constipation. Negative for blood in stool (no melena), diarrhea and nausea.       No gerd  Genitourinary:  Negative for difficulty urinating, dysuria, frequency and hematuria.      Objective:   Vitals:   12/06/20 0822  BP: (!) 148/82  Pulse: 62  Temp: 98.6 F (37 C)  SpO2: 95%   BP Readings from Last 3 Encounters:  12/06/20 (!) 148/82  08/28/20 140/90  01/03/20 130/70   Wt Readings from Last 3 Encounters:  12/06/20 209 lb (94.8 kg)  08/28/20 206 lb 9.6 oz (93.7 kg)  01/03/20 211 lb (95.7 kg)   Body mass index is 29.15 kg/m.   Physical Exam Constitutional:      General: She is not in acute distress.    Appearance: Normal appearance. She is not ill-appearing.  HENT:     Head: Normocephalic and atraumatic.  Abdominal:     General: There is no distension.     Palpations: Abdomen is soft.     Tenderness: There is no abdominal tenderness. There is no right CVA tenderness, left CVA tenderness, guarding or rebound.  Musculoskeletal:        General: Tenderness (Left paravertebral muscle tenderness with palpation lower thoracic region) present.  Skin:    General: Skin is warm and dry.  Neurological:     Mental Status: She is alert.            Assessment & Plan:    See Problem List for Assessment and Plan of chronic medical problems.

## 2020-12-06 ENCOUNTER — Other Ambulatory Visit: Payer: Self-pay | Admitting: Internal Medicine

## 2020-12-06 ENCOUNTER — Other Ambulatory Visit: Payer: Self-pay

## 2020-12-06 ENCOUNTER — Ambulatory Visit (INDEPENDENT_AMBULATORY_CARE_PROVIDER_SITE_OTHER): Payer: Medicare Other | Admitting: Internal Medicine

## 2020-12-06 VITALS — BP 148/82 | HR 62 | Temp 98.6°F | Ht 71.0 in | Wt 209.0 lb

## 2020-12-06 DIAGNOSIS — R109 Unspecified abdominal pain: Secondary | ICD-10-CM | POA: Insufficient documentation

## 2020-12-06 DIAGNOSIS — Z23 Encounter for immunization: Secondary | ICD-10-CM

## 2020-12-06 DIAGNOSIS — K5900 Constipation, unspecified: Secondary | ICD-10-CM

## 2020-12-06 MED ORDER — GABAPENTIN 100 MG PO CAPS: 200.0000 mg | ORAL_CAPSULE | Freq: Every day | ORAL | 0 refills | Status: DC

## 2020-12-06 MED ORDER — MELOXICAM 15 MG PO TABS: 15.0000 mg | ORAL_TABLET | Freq: Every day | ORAL | 0 refills | Status: DC

## 2020-12-06 NOTE — Assessment & Plan Note (Signed)
Acute Her pain is left flank/back-no true abdominal pain by history or on exam Having a bowel movement or passing gas does relieve some pain so could be related to constipation and she will work on improving that Pain may also be musculoskeletal-she does have some tenderness in her paravertebral muscles on the left and the pain is worse with laying down and may be some position changes Start gabapentin 200 mg at night since she is not sleeping well because of the pain Start meloxicam 15 mg daily with food once a day We will continue the medications for 2 months, but I advise she should see some improvement soon and if she is not seeing any improvement advised her to call or return for follow-up so we can evaluate further

## 2020-12-06 NOTE — Assessment & Plan Note (Signed)
Chronic She has had issues with constipation She is taking Metamucil and will continue Continue increased water intake Discussed that she can start eating prunes on a daily basis, consider taking a stool softener daily and if no improvement in constipation after that can take MiraLAX on a daily basis

## 2020-12-06 NOTE — Patient Instructions (Addendum)
   Flu immunization administered today.     For your constipation - try eating a few prunes daily.  You can also start taking a stool  softener daily or if needed take miralax daily.  Continue the metamucil daily.   For your side pain - start meloxicam 15 mg daily with food.  Do not take any advil Armond Hang / ibuprofen while taking this.   Take gabapentin (nerve pain medication) 2 pills at bedtime.   Call or come back in if there is no improvement.

## 2020-12-12 ENCOUNTER — Other Ambulatory Visit: Payer: Self-pay

## 2020-12-12 ENCOUNTER — Encounter (HOSPITAL_COMMUNITY): Payer: Self-pay | Admitting: Emergency Medicine

## 2020-12-12 ENCOUNTER — Emergency Department (HOSPITAL_COMMUNITY)
Admission: EM | Admit: 2020-12-12 | Discharge: 2020-12-12 | Disposition: A | Discharge status: Home / Self Care | Payer: Medicare Other | Attending: Emergency Medicine | Admitting: Emergency Medicine

## 2020-12-12 ENCOUNTER — Emergency Department (HOSPITAL_COMMUNITY): Payer: Medicare Other

## 2020-12-12 DIAGNOSIS — R5383 Other fatigue: Secondary | ICD-10-CM | POA: Insufficient documentation

## 2020-12-12 DIAGNOSIS — Z743 Need for continuous supervision: Secondary | ICD-10-CM | POA: Diagnosis not present

## 2020-12-12 DIAGNOSIS — I110 Hypertensive heart disease with heart failure: Secondary | ICD-10-CM | POA: Diagnosis not present

## 2020-12-12 DIAGNOSIS — R079 Chest pain, unspecified: Secondary | ICD-10-CM | POA: Diagnosis not present

## 2020-12-12 DIAGNOSIS — Z7982 Long term (current) use of aspirin: Secondary | ICD-10-CM | POA: Insufficient documentation

## 2020-12-12 DIAGNOSIS — R0789 Other chest pain: Secondary | ICD-10-CM | POA: Diagnosis not present

## 2020-12-12 DIAGNOSIS — Z79899 Other long term (current) drug therapy: Secondary | ICD-10-CM | POA: Diagnosis not present

## 2020-12-12 DIAGNOSIS — E119 Type 2 diabetes mellitus without complications: Secondary | ICD-10-CM | POA: Insufficient documentation

## 2020-12-12 DIAGNOSIS — K449 Diaphragmatic hernia without obstruction or gangrene: Secondary | ICD-10-CM | POA: Diagnosis not present

## 2020-12-12 DIAGNOSIS — Z955 Presence of coronary angioplasty implant and graft: Secondary | ICD-10-CM | POA: Insufficient documentation

## 2020-12-12 DIAGNOSIS — I517 Cardiomegaly: Secondary | ICD-10-CM | POA: Diagnosis not present

## 2020-12-12 DIAGNOSIS — I5032 Chronic diastolic (congestive) heart failure: Secondary | ICD-10-CM | POA: Diagnosis not present

## 2020-12-12 DIAGNOSIS — Z87891 Personal history of nicotine dependence: Secondary | ICD-10-CM | POA: Insufficient documentation

## 2020-12-12 LAB — CBC
HCT: 37 % (ref 36.0–46.0)
Hemoglobin: 11.3 g/dL — ABNORMAL LOW (ref 12.0–15.0)
MCH: 28.1 pg (ref 26.0–34.0)
MCHC: 30.5 g/dL (ref 30.0–36.0)
MCV: 92 fL (ref 80.0–100.0)
Platelets: 289 10*3/uL (ref 150–400)
RBC: 4.02 MIL/uL (ref 3.87–5.11)
RDW: 13.5 % (ref 11.5–15.5)
WBC: 5.3 10*3/uL (ref 4.0–10.5)
nRBC: 0 % (ref 0.0–0.2)

## 2020-12-12 LAB — TROPONIN I (HIGH SENSITIVITY)
Troponin I (High Sensitivity): 2 ng/L (ref <18)
Troponin I (High Sensitivity): <2 ng/L (ref <18)

## 2020-12-12 LAB — BASIC METABOLIC PANEL
Anion gap: 7 (ref 5–15)
BUN: 12 mg/dL (ref 8–23)
CO2: 27 mmol/L (ref 22–32)
Calcium: 8.9 mg/dL (ref 8.9–10.3)
Chloride: 104 mmol/L (ref 98–111)
Creatinine, Ser: 0.74 mg/dL (ref 0.44–1.00)
GFR, Estimated: >60 mL/min (ref >60)
Glucose, Bld: 83 mg/dL (ref 70–99)
Potassium: 4.1 mmol/L (ref 3.5–5.1)
Sodium: 138 mmol/L (ref 135–145)

## 2020-12-12 NOTE — ED Triage Notes (Signed)
Pt arrives via EMS from home with chest pressure. 324 ASA and 1 nitro given by EMS. Hx of MI 20 years ago. Central chest pressure that moves under left breast.

## 2020-12-12 NOTE — ED Provider Notes (Signed)
Emergency Medicine Provider Triage Evaluation Note  Addisen Chappelle , a 75 y.o. female  was evaluated in triage.  Pt with history of coronary artery disease who complains of central chest pressure that started this morning when she was loading her washing machine.  Associated shortness of breath without radiation of her pain or palpitations.  EMS was activated and her pain improved with nitro and aspirin.  She is chest pain-free at this time  Review of Systems  Positive: Chest pain, shortness of breath Negative: Palpitations  Physical Exam  BP 140/76 (BP Location: Right Arm)   Pulse 63   Temp 98.8 F (37.1 C) (Oral)   Resp 16   SpO2 97%  Gen:   Awake, no distress   Resp:  Normal effort  MSK:   Moves extremities without difficulty  Other:  RRR no M/R/G.  Lung CTA B.  Medical Decision Making  Medically screening exam initiated at 1:05 PM.  Appropriate orders placed.  Annastyn Silvey was informed that the remainder of the evaluation will be completed by another provider, this initial triage assessment does not replace that evaluation, and the importance of remaining in the ED until their evaluation is complete.  This chart was dictated using voice recognition software, Dragon. Despite the best efforts of this provider to proofread and correct errors, errors may still occur which can change documentation meaning.    Paris Lore, PA-C 12/12/20 1318    Virgina Norfolk, DO 12/12/20 1326

## 2020-12-12 NOTE — ED Provider Notes (Signed)
I provided a substantive portion of the care of this patient.  I personally performed the entirety of the history for this encounter.  EKG Interpretation  Date/Time:  Tuesday December 12 2020 12:49:42 EDT Ventricular Rate:  61 PR Interval:  214 QRS Duration: 86 QT Interval:  414 QTC Calculation: 416 R Axis:   -60 Text Interpretation: Sinus rhythm with 1st degree A-V block Left axis deviation Low voltage QRS Possible Anterolateral infarct , age undetermined Abnormal ECG no acute ischemic changes, no sig change from previous Confirmed by Arby Barrette [phone]) on 12/12/2020 7:08:28 PM   Patient had acute onset of central chest pain.  She was doing laundry at the time.  She was given aspirin and nitroglycerin on route.  Pain improved.  She has not had return of pain.  No associated shortness of breath nausea diaphoresis or lightheadedness.  Patient does report recently feeling more fatigued.  Patient is alert.  No acute distress.  No respiratory distress.  Heart is regular no gross rub murmur gallop.  Lungs grossly clear.  No wheeze.  Calf soft and nontender.  Diagnostic work-up negative for MI, troponins negative, chest x-ray clear.  EKG unchanged.  Do not suspect PE or dissection.  Agree with plan of management.   Arby Barrette, MD 12/21/20 [phone]

## 2020-12-12 NOTE — Discharge Instructions (Addendum)
You were seen in the emergency department today for chest pain.  We discussed your labs and imaging were reassuring today.  Look for things like signs of heart attack and signs for infection, and you did not have these.  For this I do not see a reason for [place] to keep in the hospital today.  However I would like you to follow-up with your primary doctor to discuss your symptoms today.  Also like you to follow-up with your neurologist, if you do not have one, I have provided the contact information for you to call and make an appointment.  Continue to monitor how you're doing and return to the ER for new or worsening symptoms such as return of pain, shortness of breath, fevers.   It has been a pleasure seeing and caring for you today and I hope you start feeling better soon!

## 2020-12-12 NOTE — ED Provider Notes (Signed)
[facility] EMERGENCY DEPARTMENT Provider Note   CSN: [id number] Arrival date & time: 12/12/20  1245     History Chief Complaint  Patient presents with   Chest Pain    Susan Brady is a 75 y.o. female with history of anemia, hypertension, GERD, CHF, T2DM who presents to the emergency department for chest pain.  Patient states that she had an acute onset of centralized chest pain starting at 11:30 AM this morning while she was loading her washer machine.  Her daughter called 911, she was given aspirin and nitro in route which improved her pain.  She states that her pain has not returned since then.  Notes that she has been feeling more tired recently.  No recent injury or illness.  Reports history of myocardial infarction 20 years ago.   Chest Pain Associated symptoms: fatigue   Associated symptoms: no abdominal pain, no cough, no fever, no headache, no nausea, no palpitations, no shortness of breath, no vomiting and no weakness       Past Medical History:  Diagnosis Date   Anemia    CHF (congestive heart failure) (HCC)    Diabetes mellitus without complication (HCC)    Gastric ulcer    Glaucoma    Hiatal hernia    Hyperlipidemia    Hypertension     Patient Active Problem List   Diagnosis Date Noted   Left flank pain 12/06/2020   Low back pain radiating to left lower extremity 06/30/2020   SVT (supraventricular tachycardia) (HCC) 08/12/2019   Chronic right shoulder pain 05/11/2019   Palpitations 05/11/2019   Pain and swelling of knee, right 03/29/2019   Musculoskeletal neck pain 03/15/2019   Eczema 03/03/2019   Dizziness 09/22/2018   Lightheadedness 04/24/2018   Chronic diastolic heart failure (HCC) 04/14/2018   Poor balance 12/20/2016   Normal coronary arteries 08/07/2016   Cardiomegaly 08/07/2016   Dupuytren's contracture of right hand 07/19/2016   Hyperlipidemia 06/25/2016   Neck pain on right side 11/14/2015   Poor sleep pattern 11/14/2015    Constipation 11/14/2015   Hiatal hernia, large 08/30/2015   Gastric ulcer 08/30/2015   Decreased appetite 08/30/2015   Gastritis 08/15/2015   Left shoulder pain 06/22/2015   Essential hypertension 11/22/2014   Non-insulin dependent type 2 diabetes mellitus (HCC) 11/22/2014   Arthritis 11/22/2014   Glaucoma 11/22/2014   GERD (gastroesophageal reflux disease) 11/22/2014   Varicose veins 11/22/2014   Myocardial infarct, old 11/22/2014   Superficial thrombophlebitis of lower extremity 11/22/2014    Past Surgical History:  Procedure Laterality Date   LEFT HEART CATH AND CORONARY ANGIOGRAPHY N/A 07/25/2016   Procedure: Left Heart Cath and Coronary Angiography;  Surgeon: Kathleene Hazel, MD;  Location: [facility] INVASIVE CV LAB;  Service: Cardiovascular;  Laterality: N/A;     OB History   No obstetric history on file.     Family History  Problem Relation Age of Onset   Heart disease Mother    Alcohol abuse Father    Alcohol abuse Sister    Diabetes Sister    Hypertension Sister    Cancer Sister    CAD Sister        One sister with stents   Alcohol abuse Brother    Diabetes Brother    Hypertension Brother     Social History   Tobacco Use   Smoking status: Former   Smokeless tobacco: Never  Building services engineer Use: Never used  Substance Use Topics  Alcohol use: No    Alcohol/week: 0.0 standard drinks   Drug use: No    Home Medications Prior to Admission medications   Medication Sig Start Date End Date Taking? Authorizing Provider  acetaminophen (TYLENOL) 500 MG tablet Take 500-1,000 mg by mouth every 6 (six) hours as needed for mild pain.    [provider]  aspirin EC 81 MG tablet Take 1 tablet (81 mg total) by mouth daily. 03/24/15   Pincus Sanes, MD  atorvastatin (LIPITOR) 20 MG tablet TAKE 1 TABLET BY MOUTH DAILY 08/28/20   Pincus Sanes, MD  cholecalciferol (VITAMIN D) 1000 UNITS tablet Take 5,000 Units by mouth daily.     [provider]   clotrimazole-betamethasone (LOTRISONE) cream Apply 1 application topically 2 (two) times daily. Apply between toes and to toenails twice daily for 6 weeks.Use gloves to apply. 12/03/19   Freddie Breech, DPM  flecainide (TAMBOCOR) 150 MG tablet Take 0.5 tablets (75 mg total) by mouth 2 (two) times daily. 08/18/20   Runell Gess, MD  furosemide (LASIX) 20 MG tablet TAKE 1 TABLET(20 MG) BY MOUTH TWICE DAILY 10/02/20   Pincus Sanes, MD  gabapentin (NEURONTIN) 100 MG capsule Take 2 capsules (200 mg total) by mouth at bedtime. 12/06/20   Pincus Sanes, MD  Garlic 1000 MG CAPS Take 1,000 mg by mouth daily.    [provider]  hydrALAZINE (APRESOLINE) 50 MG tablet TAKE 1 AND 1/2 TABLETS BY MOUTH THREE TIMES DAILY 08/04/20   Pincus Sanes, MD  meloxicam (MOBIC) 15 MG tablet TAKE 1 TABLET(15 MG) BY MOUTH DAILY WITH FOOD 12/06/20   Pincus Sanes, MD  metoprolol tartrate (LOPRESSOR) 25 MG tablet Take 1 tablet (25 mg total) by mouth 2 (two) times daily. 09/17/19   Milagros Loll, MD  Omega-3 Fatty Acids (FISH OIL) 1000 MG CAPS Take 1 capsule by mouth daily.    [provider]  pantoprazole (PROTONIX) 40 MG tablet TAKE 1 TABLET(40 MG) BY MOUTH DAILY 10/02/20   Burns, Bobette Mo, MD  potassium chloride SA (KLOR-CON) 20 MEQ tablet TAKE 1 TABLET(20 MEQ) BY MOUTH DAILY 10/02/20   Pincus Sanes, MD  triamcinolone cream (KENALOG) 0.1 % Apply 1 application topically 2 (two) times daily. Patient taking differently: Apply 1 application topically daily as needed (hands). 03/15/19   Pincus Sanes, MD  vitamin E 400 UNIT capsule Take 1,000 Units by mouth daily.     [provider]    Allergies    Lisinopril  Review of Systems   Review of Systems  Constitutional:  Positive for fatigue. Negative for chills and fever.  HENT:  Negative for congestion.   Respiratory:  Negative for cough and shortness of breath.   Cardiovascular:  Positive for chest pain. Negative for palpitations and  leg swelling.  Gastrointestinal:  Negative for abdominal pain, constipation, diarrhea, nausea and vomiting.  Genitourinary:  Negative for dysuria and urgency.  Neurological:  Negative for weakness and headaches.  All other systems reviewed and are negative.  Physical Exam Updated Vital Signs BP (!) 156/83   Pulse (!) 55   Temp 98.3 F (36.8 C) (Oral)   Resp (!) 21   Ht 5\' 11"  (1.803 m)   Wt 94.8 kg   SpO2 99%   BMI 29.15 kg/m   Physical Exam Vitals and nursing note reviewed.  Constitutional:      Appearance: Normal appearance.  HENT:     Head: Normocephalic and atraumatic.  Eyes:     Conjunctiva/sclera: Conjunctivae normal.  Cardiovascular:     Rate and Rhythm: Normal rate and regular rhythm.     Comments: Mild bilateral edema, nonpitting.  Pulses equal and normal bilateral lower extremities.  Sensation intact. Pulmonary:     Effort: Pulmonary effort is normal. No respiratory distress.     Breath sounds: Normal breath sounds.  Abdominal:     General: There is no distension.     Palpations: Abdomen is soft.     Tenderness: There is no abdominal tenderness.  Skin:    General: Skin is warm and dry.  Neurological:     General: No focal deficit present.     Mental Status: She is alert.     Comments: Neuro: Speech is clear, able to follow commands. CN III-XII intact grossly intact. PERRLA. EOMI. Sensation intact throughout. Str 5/5 all extremities.     ED Results / Procedures / Treatments   Labs (all labs ordered are listed, but only abnormal results are displayed) Labs Reviewed  CBC - Abnormal; Notable for the following components:      Result Value   Hemoglobin 11.3 (*)    All other components within normal limits  BASIC METABOLIC PANEL  TROPONIN I (HIGH SENSITIVITY)  TROPONIN I (HIGH SENSITIVITY)    EKG EKG Interpretation  Date/Time:  Tuesday December 12 2020 12:49:42 EDT Ventricular Rate:  61 PR Interval:  214 QRS Duration: 86 QT Interval:  414 QTC  Calculation: 416 R Axis:   -60 Text Interpretation: Sinus rhythm with 1st degree A-V block Left axis deviation Low voltage QRS Possible Anterolateral infarct , age undetermined Abnormal ECG no acute ischemic changes, no sig change from previous Confirmed by Arby Barrette [phone]) on 12/12/2020 7:08:28 PM  Radiology DG Chest 2 View  Result Date: 12/12/2020 CLINICAL DATA:  Chest pain. EXAM: CHEST - 2 VIEW COMPARISON:  July 23, 2016. FINDINGS: Enlarged cardiac silhouette. Tortuous aorta. Small to moderate hiatal hernia. No consolidation. No visible pleural effusions or pneumothorax. Degenerative changes of the thoracic spine with bridging anterior osteophytes. Exaggerated thoracic kyphosis. IMPRESSION: 1. No evidence of acute cardiopulmonary disease. 2. Similar cardiomegaly. 3. Similar small to moderate hiatal hernia. Electronically Signed   By: Feliberto Harts M.D.   On: 12/12/2020 14:27    Procedures Procedures   Medications Ordered in ED Medications - No data to display  ED Course  I have reviewed the triage vital signs and the nursing notes.  Pertinent labs & imaging results that were available during my care of the patient were reviewed by me and considered in my medical decision making (see chart for details).    MDM Rules/Calculators/A&P                           Patient is 75 year old female with history of coronary artery disease who presents to the emergency department for chest pain that started this morning.  Patient was loading her washing machine at 1130 and had acute onset of pain, with associated shortness of breath.  She was given aspirin and nitro in route with EMS, and states this improved her pain.  History of MI 20 years ago.  No recent injury or illness.  The emergent differential diagnosis of chest pain includes: Acute coronary syndrome, pericarditis, aortic dissection, pulmonary embolism, tension pneumothorax, and esophageal rupture.   On my evaluation patient  states that pain has resolved.  Lung sounds are clear to auscultation bilaterally.  Patient  is slightly bradycardic, appears unchanged compared to prior.  Abdomen soft, nontender nondistended.  Mild bilateral leg swelling, nonpitting.  Patient states this is unchanged for her.  Pulses equal and normal in bilateral lower extremities, and sensation intact.  Normal neurologic exam as above.  CBC shows no leukocytosis.  Repeat troponin less than 2.  Chest x-ray shows no acute cardiopulmonary abnormality. EKG unchanged compared to prior. Heart score for major cardiac events = 4  Patient not requiring admission or inpatient treatment for symptoms at this time.  Plan to discharge home with close follow-up with primary care provider.  Discussed reasons to return to the emergency department.  Patient agreeable to plan.  I discussed this case with my attending physician Dr. Donnald Garre who cosigned this note including patient's presenting symptoms, physical exam, and planned diagnostics and interventions. Attending physician stated agreement with plan or made changes to plan which were implemented.    Final Clinical Impression(s) / ED Diagnoses Final diagnoses:  Chest pain, unspecified type    Rx / DC Orders ED Discharge Orders     None      Portions of this report may have been transcribed using voice recognition software. Every effort was made to ensure accuracy; however, inadvertent computerized transcription errors may be present.    Jeanella Flattery 12/12/20 Darin Engels, MD 12/21/20 [phone]

## 2020-12-29 ENCOUNTER — Other Ambulatory Visit: Payer: Self-pay | Admitting: Internal Medicine

## 2021-01-02 ENCOUNTER — Other Ambulatory Visit: Payer: Self-pay | Admitting: Internal Medicine

## 2021-01-04 ENCOUNTER — Ambulatory Visit: Payer: Medicare Other | Admitting: Nurse Practitioner

## 2021-01-05 ENCOUNTER — Telehealth: Payer: Self-pay | Admitting: Internal Medicine

## 2021-01-05 ENCOUNTER — Ambulatory Visit: Payer: Medicare Other | Admitting: Family Medicine

## 2021-01-05 NOTE — Telephone Encounter (Signed)
Patient's daughter Susan Brady states patient has nausea, abdominal pain, and dark stool x3d  Transferred [facility] to [facility]

## 2021-01-05 NOTE — Telephone Encounter (Signed)
Caller connected to [facility].   Caller states she has had upper abdominal pain with nausea and dark stools for 3 days. No fever, stools are dark brown. No abd pain currently.   Advised to see PCP with 4 hours. Appointment Scheduled.

## 2021-01-10 ENCOUNTER — Other Ambulatory Visit: Payer: Self-pay | Admitting: Cardiology

## 2021-01-16 ENCOUNTER — Encounter: Payer: Self-pay | Admitting: Podiatry

## 2021-01-16 ENCOUNTER — Ambulatory Visit (INDEPENDENT_AMBULATORY_CARE_PROVIDER_SITE_OTHER): Payer: Medicare Other | Admitting: Podiatry

## 2021-01-16 ENCOUNTER — Other Ambulatory Visit: Payer: Self-pay

## 2021-01-16 DIAGNOSIS — E119 Type 2 diabetes mellitus without complications: Secondary | ICD-10-CM | POA: Diagnosis not present

## 2021-01-16 DIAGNOSIS — B353 Tinea pedis: Secondary | ICD-10-CM

## 2021-01-16 DIAGNOSIS — M79676 Pain in unspecified toe(s): Secondary | ICD-10-CM | POA: Diagnosis not present

## 2021-01-16 DIAGNOSIS — B351 Tinea unguium: Secondary | ICD-10-CM

## 2021-01-16 NOTE — Progress Notes (Signed)
  Subjective:  Patient ID: Susan Brady, female    DOB: 1945-09-18,   MRN: [id number]  Chief Complaint  Patient presents with   Nail Problem     Routine foot care    75 y.o. female presents for routine foot care. Patient relates she has been doing well. States her nails are elongated painful and thick and would like them trimmed. Denies any burning or tinling. Last A1c was 5.9 on 08/28/20 . Denies any other pedal complaints. Denies n/v/f/c.   PCP: Cheryll Cockayne MD   Past Medical History:  Diagnosis Date   Anemia    CHF (congestive heart failure) (HCC)    Diabetes mellitus without complication (HCC)    Gastric ulcer    Glaucoma    Hiatal hernia    Hyperlipidemia    Hypertension     Objective:  Physical Exam: Vascular: DP/PT pulses 2/4 bilateral. CFT <3 seconds. Normal hair growth on digits. No edema.  Skin. No lacerations or abrasions bilateral feet. Nails 1-5 are thickened discolored and elongated with subungual debris.  Musculoskeletal: MMT 5/5 bilateral lower extremities in DF, PF, Inversion and Eversion. Deceased ROM in DF of ankle joint.  Neurological: Sensation intact to light touch.   Assessment:   1. Pain due to onychomycosis of toenail   2. Tinea pedis of both feet   3. Non-insulin dependent type 2 diabetes mellitus (HCC)      Plan:  Patient was evaluated and treated and all questions answered. -Discussed and educated patient on diabetic foot care, especially with  regards to the vascular, neurological and musculoskeletal systems.  -Stressed the importance of good glycemic control and the detriment of not  controlling glucose levels in relation to the foot. -Discussed supportive shoes at all times and checking feet regularly.  -Mechanically debrided all nails 1-5 bilateral using sterile nail nipper and filed with dremel without incident  -Answered all patient questions -Patient to return  in 3 months for at risk foot care -Patient advised to call the office if  any problems or questions arise in the meantime.   Louann Sjogren, DPM

## 2021-02-05 ENCOUNTER — Other Ambulatory Visit: Payer: Self-pay | Admitting: Internal Medicine

## 2021-02-06 ENCOUNTER — Other Ambulatory Visit: Payer: Self-pay | Admitting: Internal Medicine

## 2021-02-07 ENCOUNTER — Other Ambulatory Visit: Payer: Self-pay

## 2021-02-07 ENCOUNTER — Ambulatory Visit (INDEPENDENT_AMBULATORY_CARE_PROVIDER_SITE_OTHER): Payer: Medicare Other

## 2021-02-07 DIAGNOSIS — Z1231 Encounter for screening mammogram for malignant neoplasm of breast: Secondary | ICD-10-CM

## 2021-02-07 DIAGNOSIS — Z0000 Encounter for general adult medical examination without abnormal findings: Secondary | ICD-10-CM | POA: Diagnosis not present

## 2021-02-07 NOTE — Progress Notes (Signed)
I connected with Susan Brady today by telephone and verified that I am speaking with the correct person using two identifiers. Location patient: home Location provider: work Persons participating in the virtual visit: patient, provider.   I discussed the limitations, risks, security and privacy concerns of performing an evaluation and management service by telephone and the availability of in person appointments. I also discussed with the patient that there may be a patient responsible charge related to this service. The patient expressed understanding and verbally consented to this telephonic visit.    Interactive audio and video telecommunications were attempted between this provider and patient, however failed, due to patient having technical difficulties OR patient did not have access to video capability.  We continued and completed visit with audio only.  Some vital signs may be absent or patient reported.   Time Spent with patient on telephone encounter: 40 minutes  Subjective:   Susan Brady is a 75 y.o. female who presents for Medicare Annual (Subsequent) preventive examination.  Review of Systems     Cardiac Risk Factors include: advanced age (>51men, >67 women);dyslipidemia;family history of premature cardiovascular disease;hypertension     Objective:    There were no vitals filed for this visit. There is no height or weight on file to calculate BMI.  Advanced Directives 02/07/2021 01/03/2020 08/01/2019 12/20/2016 07/23/2016 07/23/2016 08/12/2015  Does Patient Have a Medical Advance Directive? No Yes No No No No No  Does patient want to make changes to medical advance directive? - No - Patient declined - - - - -  Would patient like information on creating a medical advance directive? No - Patient declined - No - Patient declined Yes (ED - Information included in AVS) No - Patient declined - No - patient declined information    Current Medications (verified) Outpatient  Encounter Medications as of 02/07/2021  Medication Sig   acetaminophen (TYLENOL) 500 MG tablet Take 500-1,000 mg by mouth every 6 (six) hours as needed for mild pain.   aspirin EC 81 MG tablet Take 1 tablet (81 mg total) by mouth daily.   atorvastatin (LIPITOR) 20 MG tablet TAKE 1 TABLET BY MOUTH DAILY   cholecalciferol (VITAMIN D) 1000 UNITS tablet Take 5,000 Units by mouth daily.    clotrimazole-betamethasone (LOTRISONE) cream Apply 1 application topically 2 (two) times daily. Apply between toes and to toenails twice daily for 6 weeks.Use gloves to apply.   flecainide (TAMBOCOR) 150 MG tablet Take 0.5 tablets (75 mg total) by mouth 2 (two) times daily.   furosemide (LASIX) 20 MG tablet TAKE 1 TABLET(20 MG) BY MOUTH TWICE DAILY   gabapentin (NEURONTIN) 100 MG capsule TAKE 2 CAPSULES(200 MG) BY MOUTH AT BEDTIME   Garlic 1000 MG CAPS Take 1,000 mg by mouth daily.   hydrALAZINE (APRESOLINE) 50 MG tablet TAKE 1 AND 1/2 TABLET BY MOUTH THREE TIMES DAILY   meloxicam (MOBIC) 15 MG tablet TAKE 1 TABLET(15 MG) BY MOUTH DAILY WITH FOOD   metoprolol tartrate (LOPRESSOR) 25 MG tablet Take 1 tablet (25 mg total) by mouth 2 (two) times daily.   Omega-3 Fatty Acids (FISH OIL) 1000 MG CAPS Take 1 capsule by mouth daily.   pantoprazole (PROTONIX) 40 MG tablet TAKE 1 TABLET(40 MG) BY MOUTH DAILY   potassium chloride SA (KLOR-CON) 20 MEQ tablet TAKE 1 TABLET(20 MEQ) BY MOUTH DAILY   triamcinolone cream (KENALOG) 0.1 % Apply 1 application topically 2 (two) times daily. (Patient taking differently: Apply 1 application topically daily as needed (hands).)  vitamin E 400 UNIT capsule Take 1,000 Units by mouth daily.    No facility-administered encounter medications on file as of 02/07/2021.    Allergies (verified) Lisinopril   History: Past Medical History:  Diagnosis Date   Anemia    CHF (congestive heart failure) (HCC)    Diabetes mellitus without complication (HCC)    Gastric ulcer    Glaucoma     Hiatal hernia    Hyperlipidemia    Hypertension    Past Surgical History:  Procedure Laterality Date   LEFT HEART CATH AND CORONARY ANGIOGRAPHY N/A 07/25/2016   Procedure: Left Heart Cath and Coronary Angiography;  Surgeon: Kathleene Hazel, MD;  Location: [facility];  Service: Cardiovascular;  Laterality: N/A;   Family History  Problem Relation Age of Onset   Heart disease Mother    Alcohol abuse Father    Alcohol abuse Sister    Diabetes Sister    Hypertension Sister    Cancer Sister    CAD Sister        One sister with stents   Alcohol abuse Brother    Diabetes Brother    Hypertension Brother    Social History   Socioeconomic History   Marital status: Divorced    Spouse name: Not on file   Number of children: Not on file   Years of education: Not on file   Highest education level: Not on file  Occupational History   Not on file  Tobacco Use   Smoking status: Former   Smokeless tobacco: Never  Vaping Use   Vaping Use: Never used  Substance and Sexual Activity   Alcohol use: No    Alcohol/week: 0.0 standard drinks   Drug use: No   Sexual activity: Not on file  Other Topics Concern   Not on file  Social History Narrative   Not on file   Social Determinants of Health   Financial Resource Strain: Low Risk    Difficulty of Paying Living Expenses: Not hard at all  Food Insecurity: No Food Insecurity   Worried About Programme researcher, broadcasting/film/video in the Last Year: Never true   Ran Out of Food in the Last Year: Never true  Transportation Needs: No Transportation Needs   Lack of Transportation (Medical): No   Lack of Transportation (Non-Medical): No  Physical Activity: Sufficiently Active   Days of Exercise per Week: 5 days   Minutes of Exercise per Session: 30 min  Stress: No Stress Concern Present   Feeling of Stress : Not at all  Social Connections: Socially Isolated   Frequency of Communication with Friends and Family: More than three times a week    Frequency of Social Gatherings with Friends and Family: More than three times a week   Attends Religious Services: Never   Database administrator or Organizations: No   Attends Engineer, structural: Never   Marital Status: Never married    Tobacco Counseling Counseling given: Not Answered   Clinical Intake:  Pre-visit preparation completed: Yes  Pain : No/denies pain     Nutritional Risks: None Diabetes: No CBG done?: No Did pt. bring in CBG monitor from home?: No  How often do you need to have someone help you when you read instructions, pamphlets, or other written materials from your doctor or pharmacy?: 1 - Never What is the last grade level you completed in school?: 9th grade  Diabetic? no  Interpreter Needed?: No  Information entered by ::  Susie Cassette, LPN   Activities of Daily Living In your present state of health, do you have any difficulty performing the following activities: 02/07/2021  Hearing? N  Vision? N  Difficulty concentrating or making decisions? Y  Walking or climbing stairs? N  Dressing or bathing? N  Doing errands, shopping? Y  Preparing Food and eating ? N  Using the Toilet? N  In the past six months, have you accidently leaked urine? Y  Do you have problems with loss of bowel control? N  Managing your Medications? N  Managing your Finances? Y  Housekeeping or managing your Housekeeping? Y  Some recent data might be hidden    Patient Care Team: Pincus Sanes, MD as PCP - General (Internal Medicine) [facility] as Consulting Physician (Optometry)  Indicate any recent Medical Services you may have received from other than [facility] providers in the past year (date may be approximate).     Assessment:   This is a routine wellness examination for Susan Brady.  Hearing/Vision screen Hearing Screening - Comments:: Patient denied any hearing difficulty.   No hearing aids.  Vision Screening - Comments:: Patient wears  corrective glasses/contacts.  Eye exam done annually by: [facility].  Dietary issues and exercise activities discussed: Current Exercise Habits: Home exercise routine, Type of exercise: Other - see comments (chair exercises), Time (Minutes): 30, Frequency (Times/Week): 5, Weekly Exercise (Minutes/Week): 150, Intensity: Mild, Exercise limited by: cardiac condition(s);orthopedic condition(s)   Goals Addressed               This Visit's Progress     Patient Stated (pt-stated)        Try to maintain.      Depression Screen PHQ 2/9 Scores 02/07/2021 01/03/2020 03/03/2019 12/20/2016 12/26/2015 11/22/2014  PHQ - 2 Score 0 1 0 1 0 0  PHQ- 9 Score - - - 3 - -    Fall Risk Fall Risk  02/07/2021 01/03/2020 03/03/2019 12/20/2016 12/26/2015  Falls in the past year? 0 0 0 Yes No  Number falls in past yr: 0 0 0 1 -  Injury with Fall? 0 0 0 - -  Risk for fall due to : No Fall Risks No Fall Risks - - -  Follow up Falls prevention discussed Falls evaluation completed;Education provided - - -    FALL RISK PREVENTION PERTAINING TO THE HOME:  Any stairs in or around the home? No  If so, are there any without handrails? No  Home free of loose throw rugs in walkways, pet beds, electrical cords, etc? Yes  Adequate lighting in your home to reduce risk of falls? Yes   ASSISTIVE DEVICES UTILIZED TO PREVENT FALLS:  Life alert? No  Use of a cane, walker or w/c? No  Grab bars in the bathroom? Yes  Shower chair or bench in shower? Yes  Elevated toilet seat or a handicapped toilet? Yes   TIMED UP AND GO:  Was the test performed? No .  Length of time to ambulate 10 feet: n/a sec.   Cognitive Function: Not completed at this visit. MMSE - Mini Mental State Exam 12/20/2016 12/20/2016  Not completed: (No Data) Refused     6CIT Screen 01/03/2020  What Year? 0 points  What month? 0 points  What time? 0 points  Count back from 20 0 points  Months in reverse 0 points  Repeat phrase 0  points  Total Score 0    Immunizations Immunization History  Administered Date(s) Administered  Fluad Quad(high Dose 65+) 12/04/2018, 11/05/2019, 12/06/2020   Influenza, High Dose Seasonal PF 11/14/2015, 12/20/2016, 12/05/2017   Influenza,inj,Quad PF,6+ Mos 11/22/2014   Moderna Sars-Covid-2 Vaccination 03/30/2019, 04/28/2019   Pneumococcal Conjugate-13 03/24/2015   Pneumococcal Polysaccharide-23 12/20/2016    TDAP status: declined  Flu Vaccine status: Up to date  Pneumococcal vaccine status: Up to date  Covid-19 vaccine status: Completed vaccines  Qualifies for Shingles Vaccine? Yes   Zostavax completed No   Shingrix Completed?: No.    Education has been provided regarding the importance of this vaccine. Patient has been advised to call insurance company to determine out of pocket expense if they have not yet received this vaccine. Advised may also receive vaccine at local pharmacy or Health Dept. Verbalized acceptance and understanding.  Screening Tests Health Maintenance  Topic Date Due   OPHTHALMOLOGY EXAM  Never done   TETANUS/TDAP  Never done   Zoster Vaccines- Shingrix (1 of 2) Never done   COVID-19 Vaccine (3 - Moderna risk series) 05/26/2019   HEMOGLOBIN A1C  02/28/2021   FOOT EXAM  01/16/2022   DEXA SCAN  07/13/2024   Pneumonia Vaccine 5+ Years old  Completed   INFLUENZA VACCINE  Completed   Hepatitis C Screening  Completed   HPV VACCINES  Aged Out   COLONOSCOPY (Pts 45-50yrs Insurance coverage will need to be confirmed)  Discontinued    Health Maintenance  Health Maintenance Due  Topic Date Due   OPHTHALMOLOGY EXAM  Never done   TETANUS/TDAP  Never done   Zoster Vaccines- Shingrix (1 of 2) Never done   COVID-19 Vaccine (3 - Moderna risk series) 05/26/2019    Colorectal cancer screening: No longer required.   Mammogram status: Ordered 02/07/2021. Pt provided with contact info and advised to call to schedule appt.   Bone Density status: Completed  07/14/2019. Results reflect: Bone density results: NORMAL. Repeat every 5 years.  Lung Cancer Screening: (Low Dose CT Chest recommended if Age 65-80 years, 30 pack-year currently smoking OR have quit w/in 15years.) does not qualify.   Lung Cancer Screening Referral: no  Additional Screening:  Hepatitis C Screening: does qualify; Completed yes  Vision Screening: Recommended annual ophthalmology exams for early detection of glaucoma and other disorders of the eye. Is the patient up to date with their annual eye exam?  Yes  Who is the provider or what is the name of the office in which the patient attends annual eye exams? [facility]   If pt is not established with a provider, would they like to be referred to a provider to establish care? No .   Dental Screening: Recommended annual dental exams for proper oral hygiene  Community Resource Referral / Chronic Care Management: CRR required this visit?  No   CCM required this visit?  No      Plan:     I have personally reviewed and noted the following in the patients chart:   Medical and social history Use of alcohol, tobacco or illicit drugs  Current medications and supplements including opioid prescriptions.  Functional ability and status Nutritional status Physical activity Advanced directives List of other physicians Hospitalizations, surgeries, and ER visits in previous 12 months Vitals Screenings to include cognitive, depression, and falls Referrals and appointments  In addition, I have reviewed and discussed with patient certain preventive protocols, quality metrics, and best practice recommendations. A written personalized care plan for preventive services as well as general preventive health recommendations were provided to patient.  Mickeal Needy, LPN   [id number]   Nurse Notes:  There were no vitals filed for this visit. There is no height or weight on file to calculate BMI. Hearing  Screening - Comments:: Patient denied any hearing difficulty.   No hearing aids.  Vision Screening - Comments:: Patient wears corrective glasses/contacts.  Eye exam done annually by: [facility].

## 2021-02-25 ENCOUNTER — Encounter: Payer: Self-pay | Admitting: Internal Medicine

## 2021-02-25 NOTE — Progress Notes (Signed)
Subjective:    Patient ID: Susan Brady, female    DOB: [birth year], 76 y.o.   MRN: [id number]  This visit occurred during the SARS-CoV-2 public health emergency.  Safety protocols were in place, including screening questions prior to the visit, additional usage of staff PPE, and extensive cleaning of exam room while observing appropriate contact time as indicated for disinfecting solutions.     HPI The patient is here for follow up of their chronic medical problems, including htn, hld, DM, gerd, OA, back with radiculopathy.  She is here with her daughter.   Still having left lower back pain radiates around to front.  Not worse with certain positions or activities, but she states she mostly feels it when laying down or gets up to walk. No pain with walking. Sometimes it hurts with constipation.  She has been having daily bowel movements because she has been taking Metamucil, but still has the pain.   She worries about her memory - has difficulty remembering where she put something.  Has difficulty remembering things in general.   Repeats questions.  Her daughter agrees.  They both feel this is mild and does not need further evaluation at this time.   Medications and allergies reviewed with patient and updated if appropriate.  Patient Active Problem List   Diagnosis Date Noted   Memory difficulties 02/28/2021   Left flank pain 12/06/2020   Low back pain radiating to left lower extremity 06/30/2020   SVT (supraventricular tachycardia) (HCC) 08/12/2019   Chronic right shoulder pain 05/11/2019   Palpitations 05/11/2019   Pain and swelling of knee, right 03/29/2019   Eczema 03/03/2019   Dizziness 09/22/2018   Lightheadedness 04/24/2018   Chronic diastolic heart failure (HCC) 04/14/2018   Poor balance 12/20/2016   Normal coronary arteries 08/07/2016   Cardiomegaly 08/07/2016   Dupuytren's contracture of right hand 07/19/2016   Hyperlipidemia 06/25/2016   Poor sleep pattern  11/14/2015   Constipation 11/14/2015   Hiatal hernia, large 08/30/2015   Gastric ulcer 08/30/2015   Decreased appetite 08/30/2015   Gastritis 08/15/2015   Left shoulder pain 06/22/2015   Essential hypertension 11/22/2014   Non-insulin dependent type 2 diabetes mellitus (HCC) 11/22/2014   Arthritis 11/22/2014   Glaucoma 11/22/2014   GERD (gastroesophageal reflux disease) 11/22/2014   Varicose veins 11/22/2014   Myocardial infarct, old 11/22/2014   Superficial thrombophlebitis of lower extremity 11/22/2014    Current Outpatient Medications on File Prior to Visit  Medication Sig Dispense Refill   acetaminophen (TYLENOL) 500 MG tablet Take 500-1,000 mg by mouth every 6 (six) hours as needed for mild pain.     aspirin EC 81 MG tablet Take 1 tablet (81 mg total) by mouth daily.     atorvastatin (LIPITOR) 20 MG tablet TAKE 1 TABLET BY MOUTH DAILY 90 tablet 1   cholecalciferol (VITAMIN D) 1000 UNITS tablet Take 5,000 Units by mouth daily.      clotrimazole-betamethasone (LOTRISONE) cream Apply 1 application topically 2 (two) times daily. Apply between toes and to toenails twice daily for 6 weeks.Use gloves to apply. 45 g 1   flecainide (TAMBOCOR) 150 MG tablet Take 0.5 tablets (75 mg total) by mouth 2 (two) times daily. 90 tablet 2   furosemide (LASIX) 20 MG tablet TAKE 1 TABLET(20 MG) BY MOUTH TWICE DAILY 180 tablet 1   gabapentin (NEURONTIN) 100 MG capsule TAKE 2 CAPSULES(200 MG) BY MOUTH AT BEDTIME 60 capsule 0   Garlic 1000 MG CAPS Take 1,000  mg by mouth daily.     metoprolol tartrate (LOPRESSOR) 25 MG tablet Take 1 tablet (25 mg total) by mouth 2 (two) times daily. 60 tablet 0   Omega-3 Fatty Acids (FISH OIL) 1000 MG CAPS Take 1 capsule by mouth daily.     pantoprazole (PROTONIX) 40 MG tablet TAKE 1 TABLET(40 MG) BY MOUTH DAILY 90 tablet 1   potassium chloride SA (KLOR-CON) 20 MEQ tablet TAKE 1 TABLET(20 MEQ) BY MOUTH DAILY 90 tablet 1   prednisoLONE acetate (PRED FORTE) 1 % ophthalmic  suspension Place 1 drop into the right eye 3 (three) times daily.     triamcinolone cream (KENALOG) 0.1 % Apply 1 application topically 2 (two) times daily. (Patient taking differently: Apply 1 application topically daily as needed (hands).) 30 g 0   vitamin E 400 UNIT capsule Take 1,000 Units by mouth daily.      No current facility-administered medications on file prior to visit.    Past Medical History:  Diagnosis Date   Anemia    CHF (congestive heart failure) (HCC)    Diabetes mellitus without complication (HCC)    Gastric ulcer    Glaucoma    Hiatal hernia    Hyperlipidemia    Hypertension     Past Surgical History:  Procedure Laterality Date   LEFT HEART CATH AND CORONARY ANGIOGRAPHY N/A 07/25/2016   Procedure: Left Heart Cath and Coronary Angiography;  Surgeon: Kathleene Hazel, MD;  Location: [facility];  Service: Cardiovascular;  Laterality: N/A;    Social History   Socioeconomic History   Marital status: Divorced    Spouse name: Not on file   Number of children: Not on file   Years of education: Not on file   Highest education level: Not on file  Occupational History   Not on file  Tobacco Use   Smoking status: Former   Smokeless tobacco: Never  Vaping Use   Vaping Use: Never used  Substance and Sexual Activity   Alcohol use: No    Alcohol/week: 0.0 standard drinks   Drug use: No   Sexual activity: Not on file  Other Topics Concern   Not on file  Social History Narrative   Not on file   Social Determinants of Health   Financial Resource Strain: Low Risk    Difficulty of Paying Living Expenses: Not hard at all  Food Insecurity: No Food Insecurity   Worried About Programme researcher, broadcasting/film/video in the Last Year: Never true   Ran Out of Food in the Last Year: Never true  Transportation Needs: No Transportation Needs   Lack of Transportation (Medical): No   Lack of Transportation (Non-Medical): No  Physical Activity: Sufficiently Active   Days of  Exercise per Week: 5 days   Minutes of Exercise per Session: 30 min  Stress: No Stress Concern Present   Feeling of Stress : Not at all  Social Connections: Socially Isolated   Frequency of Communication with Friends and Family: More than three times a week   Frequency of Social Gatherings with Friends and Family: More than three times a week   Attends Religious Services: Never   Database administrator or Organizations: No   Attends Banker Meetings: Never   Marital Status: Never married    Family History  Problem Relation Age of Onset   Heart disease Mother    Alcohol abuse Father    Alcohol abuse Sister    Diabetes Sister  Hypertension Sister    Cancer Sister    CAD Sister        One sister with stents   Alcohol abuse Brother    Diabetes Brother    Hypertension Brother     Review of Systems  Constitutional:  Negative for fever.  Respiratory:  Positive for shortness of breath (sometimes DOE). Negative for cough and wheezing.   Cardiovascular:  Positive for palpitations (occ - stable). Negative for chest pain and leg swelling.  Musculoskeletal:  Positive for arthralgias (knees). Negative for back pain and neck pain.  Neurological:  Positive for headaches (sinus pressure on one side). Negative for light-headedness.      Objective:   Vitals:   02/28/21 0857  BP: (!) 148/90  Pulse: 78  Temp: 98.5 F (36.9 C)  SpO2: 96%   BP Readings from Last 3 Encounters:  02/28/21 (!) 148/90  12/12/20 (!) 146/77  12/06/20 (!) 148/82   Wt Readings from Last 3 Encounters:  02/28/21 209 lb 6.4 oz (95 kg)  12/12/20 208 lb 15.9 oz (94.8 kg)  12/06/20 209 lb (94.8 kg)   Body mass index is 29.21 kg/m.   Physical Exam    Constitutional: Appears well-developed and well-nourished. No distress.  HENT:  Head: Normocephalic and atraumatic.  Neck: Neck supple. No tracheal deviation present. No thyromegaly present.  No cervical lymphadenopathy Cardiovascular: Normal  rate, regular rhythm and normal heart sounds.   No murmur heard. No carotid bruit .  No edema Pulmonary/Chest: Effort normal and breath sounds normal. No respiratory distress. No has no wheezes. No rales. Musculoskeletal: No lumbar spine pain with palpation, no left flank or abdominal pain with palpation Skin: Skin is warm and dry. Not diaphoretic.  Psychiatric: Normal mood and affect. Behavior is normal.      Assessment & Plan:    See Problem List for Assessment and Plan of chronic medical problems.

## 2021-02-25 NOTE — Patient Instructions (Addendum)
°  Blood work was ordered.     Medications changes include :   stop the meloxicam    Please followup in 6 months

## 2021-02-28 ENCOUNTER — Ambulatory Visit (INDEPENDENT_AMBULATORY_CARE_PROVIDER_SITE_OTHER): Payer: Commercial Managed Care - HMO | Admitting: Internal Medicine

## 2021-02-28 ENCOUNTER — Other Ambulatory Visit: Payer: Self-pay

## 2021-02-28 VITALS — BP 148/90 | HR 78 | Temp 98.5°F | Ht 71.0 in | Wt 209.4 lb

## 2021-02-28 DIAGNOSIS — R413 Other amnesia: Secondary | ICD-10-CM

## 2021-02-28 DIAGNOSIS — E119 Type 2 diabetes mellitus without complications: Secondary | ICD-10-CM | POA: Diagnosis not present

## 2021-02-28 DIAGNOSIS — K219 Gastro-esophageal reflux disease without esophagitis: Secondary | ICD-10-CM | POA: Diagnosis not present

## 2021-02-28 DIAGNOSIS — I10 Essential (primary) hypertension: Secondary | ICD-10-CM

## 2021-02-28 DIAGNOSIS — R109 Unspecified abdominal pain: Secondary | ICD-10-CM | POA: Diagnosis not present

## 2021-02-28 DIAGNOSIS — I5032 Chronic diastolic (congestive) heart failure: Secondary | ICD-10-CM

## 2021-02-28 DIAGNOSIS — M1990 Unspecified osteoarthritis, unspecified site: Secondary | ICD-10-CM

## 2021-02-28 DIAGNOSIS — E7849 Other hyperlipidemia: Secondary | ICD-10-CM

## 2021-02-28 DIAGNOSIS — M79605 Pain in left leg: Secondary | ICD-10-CM

## 2021-02-28 DIAGNOSIS — K5900 Constipation, unspecified: Secondary | ICD-10-CM | POA: Diagnosis not present

## 2021-02-28 LAB — COMPREHENSIVE METABOLIC PANEL
ALT: 11 U/L (ref 0–35)
AST: 17 U/L (ref 0–37)
Albumin: 4.2 g/dL (ref 3.5–5.2)
Alkaline Phosphatase: 50 U/L (ref 39–117)
BUN: 11 mg/dL (ref 6–23)
CO2: 31 mEq/L (ref 19–32)
Calcium: 9.2 mg/dL (ref 8.4–10.5)
Chloride: 104 mEq/L (ref 96–112)
Creatinine, Ser: 0.71 mg/dL (ref 0.40–1.20)
GFR: 82.62 mL/min (ref >60.00)
Glucose, Bld: 91 mg/dL (ref 70–99)
Potassium: 3.9 mEq/L (ref 3.5–5.1)
Sodium: 140 mEq/L (ref 135–145)
Total Bilirubin: 0.6 mg/dL (ref 0.2–1.2)
Total Protein: 6.8 g/dL (ref 6.0–8.3)

## 2021-02-28 LAB — LIPID PANEL
Cholesterol: 146 mg/dL (ref 0–200)
HDL: 72.1 mg/dL (ref >39.00)
LDL Cholesterol: 62 mg/dL (ref 0–99)
NonHDL: 74.02
Total CHOL/HDL Ratio: 2
Triglycerides: 61 mg/dL (ref 0.0–149.0)
VLDL: 12.2 mg/dL (ref 0.0–40.0)

## 2021-02-28 LAB — HEMOGLOBIN A1C: Hgb A1c MFr Bld: 6 % (ref 4.6–6.5)

## 2021-02-28 MED ORDER — HYDRALAZINE HCL 100 MG PO TABS: 100.0000 mg | ORAL_TABLET | Freq: Three times a day (TID) | ORAL | 2 refills | Status: DC

## 2021-02-28 NOTE — Assessment & Plan Note (Signed)
Continues to have intermittent left lower back/flank pain radiating around to her abdomen Seems to be worse when laying down and gets up-suggesting possible musculoskeletal cause Also states it is worse when she is constipated, but has the pain even if she is not constipated Difficult to say what is causing the pain Taking gabapentin 200 mg at night and that seems to be helping Taking meloxicam 15 mg daily-not sure if this helps Advise stopping the meloxicam since I do not want her on that long-term Can take Tylenol as needed Her and her daughter deferred seeing a specialist at this time Most likely pain seems to be musculoskeletal, but hard to know for sure

## 2021-02-28 NOTE — Assessment & Plan Note (Deleted)
Chronic Taking meloxicam 15 mg daily CMP

## 2021-02-28 NOTE — Assessment & Plan Note (Signed)
Chronic GERD controlled Continue pantoprazole 40 mg daily 

## 2021-02-28 NOTE — Assessment & Plan Note (Signed)
Chronic Controlled with Metamucil Gummies

## 2021-02-28 NOTE — Assessment & Plan Note (Addendum)
Chronic Blood pressure not ideally controlled CMP Continue metoprolol 25 mg twice daily Increase hydralazine to 100 mg 3 times daily

## 2021-02-28 NOTE — Assessment & Plan Note (Signed)
Chronic Diet controlled Lab Results  Component Value Date   HGBA1C 5.9 08/28/2020   A1c today

## 2021-02-28 NOTE — Assessment & Plan Note (Signed)
New Patient is worried about her memory-forgets where she puts things and has difficulty remembering things in general Daughter agrees it may have some mild memory issues.  She does repeat her questions They both feel its mild Discussed possible referral to neuropsychology-deferred Will monitor Discussed importance of regular physical exercise, mental exercises, good sleep, healthy diet and ideally avoiding depression/anxiety

## 2021-02-28 NOTE — Assessment & Plan Note (Signed)
Chronic Euvolemic on exam Continue Lasix 20 mg twice daily

## 2021-04-17 ENCOUNTER — Ambulatory Visit (INDEPENDENT_AMBULATORY_CARE_PROVIDER_SITE_OTHER): Payer: Self-pay | Admitting: Podiatry

## 2021-04-17 DIAGNOSIS — Z91199 Patient's noncompliance with other medical treatment and regimen due to unspecified reason: Secondary | ICD-10-CM

## 2021-04-17 NOTE — Progress Notes (Signed)
No show

## 2021-04-24 ENCOUNTER — Other Ambulatory Visit: Payer: Self-pay

## 2021-04-24 ENCOUNTER — Telehealth (INDEPENDENT_AMBULATORY_CARE_PROVIDER_SITE_OTHER): Payer: Medicare Other | Admitting: Family Medicine

## 2021-04-24 ENCOUNTER — Ambulatory Visit: Payer: Commercial Managed Care - HMO

## 2021-04-24 DIAGNOSIS — R059 Cough, unspecified: Secondary | ICD-10-CM

## 2021-04-24 MED ORDER — BENZONATATE 100 MG PO CAPS: 100.0000 mg | ORAL_CAPSULE | Freq: Three times a day (TID) | ORAL | 0 refills | Status: DC | PRN

## 2021-04-24 NOTE — Progress Notes (Signed)
Virtual Visit via Telephone Note ? ?I connected with Susan Brady on 04/24/21 at 10:40 AM EST by telephone and verified that I am speaking with the correct person using two identifiers. ?  ?I discussed the limitations of performing an evaluation and management service by telephone and requested permission for a phone visit. The patient expressed understanding and agreed to proceed. ? ?Location patient:  [place] ?Location provider: work or home office ?Participants present for the call: patient, provider, patient's daughter ?Patient did not have a visit with me in the prior 7 days to address this/these issue(s). ? ? ?History of Present Illness: ? ?Acute telemedicine visit for : ?-Onset: 3 days ago ?-Symptoms include: low grade fever initially - now resolved, nasal congestion, cough, body aches initially ?-Denies: NVD, CP, SOB, inability to eat/drink ?-everyone around her has been sick with the same symptoms and tested negative for covid  ?-has had 3 negative covid test ?-Pertinent past medical history: see below ?-Pertinent medication allergies:  ?Allergies  ?Allergen Reactions  ? Lisinopril Anaphylaxis  ?  cough  ?-COVID-19 vaccine status: ?Immunization History  ?Administered Date(s) Administered  ? Fluad Quad(high Dose 65+) 12/04/2018, 11/05/2019, 12/06/2020  ? Influenza, High Dose Seasonal PF 11/14/2015, 12/20/2016, 12/05/2017  ? Influenza,inj,Quad PF,6+ Mos 11/22/2014  ? Moderna Sars-Covid-2 Vaccination 03/30/2019, 04/28/2019  ? Pneumococcal Conjugate-13 03/24/2015  ? Pneumococcal Polysaccharide-23 12/20/2016  ? ? ?  ?Past Medical History:  ?Diagnosis Date  ? Anemia   ? CHF (congestive heart failure) (HCC)   ? Diabetes mellitus without complication (HCC)   ? Gastric ulcer   ? Glaucoma   ? Hiatal hernia   ? Hyperlipidemia   ? Hypertension   ? ? ?Current Outpatient Medications on File Prior to Visit  ?Medication Sig Dispense Refill  ? acetaminophen (TYLENOL) 500 MG tablet Take 500-1,000 mg by mouth every 6 (six) hours  as needed for mild pain.    ? aspirin EC 81 MG tablet Take 1 tablet (81 mg total) by mouth daily.    ? atorvastatin (LIPITOR) 20 MG tablet TAKE 1 TABLET BY MOUTH DAILY 90 tablet 1  ? cholecalciferol (VITAMIN D) 1000 UNITS tablet Take 5,000 Units by mouth daily.     ? clotrimazole-betamethasone (LOTRISONE) cream Apply 1 application topically 2 (two) times daily. Apply between toes and to toenails twice daily for 6 weeks.Use gloves to apply. 45 g 1  ? flecainide (TAMBOCOR) 150 MG tablet Take 0.5 tablets (75 mg total) by mouth 2 (two) times daily. 90 tablet 2  ? furosemide (LASIX) 20 MG tablet TAKE 1 TABLET(20 MG) BY MOUTH TWICE DAILY 180 tablet 1  ? gabapentin (NEURONTIN) 100 MG capsule TAKE 2 CAPSULES(200 MG) BY MOUTH AT BEDTIME 60 capsule 0  ? Garlic 1000 MG CAPS Take 1,000 mg by mouth daily.    ? hydrALAZINE (APRESOLINE) 100 MG tablet Take 1 tablet (100 mg total) by mouth 3 (three) times daily. 270 tablet 2  ? metoprolol tartrate (LOPRESSOR) 25 MG tablet Take 1 tablet (25 mg total) by mouth 2 (two) times daily. 60 tablet 0  ? Omega-3 Fatty Acids (FISH OIL) 1000 MG CAPS Take 1 capsule by mouth daily.    ? pantoprazole (PROTONIX) 40 MG tablet TAKE 1 TABLET(40 MG) BY MOUTH DAILY 90 tablet 1  ? potassium chloride SA (KLOR-CON) 20 MEQ tablet TAKE 1 TABLET(20 MEQ) BY MOUTH DAILY 90 tablet 1  ? prednisoLONE acetate (PRED FORTE) 1 % ophthalmic suspension Place 1 drop into the right eye 3 (three) times daily.    ?  triamcinolone cream (KENALOG) 0.1 % Apply 1 application topically 2 (two) times daily. (Patient taking differently: Apply 1 application topically daily as needed (hands).) 30 g 0  ? vitamin E 400 UNIT capsule Take 1,000 Units by mouth daily.     ? ?No current facility-administered medications on file prior to visit.  ? ? ?Observations/Objective: ?Patient sounds cheerful and well on the phone. ?I do not appreciate any SOB. ?Speech and thought processing are grossly intact. ?Patient reported vitals: ? ?Assessment  and Plan: ? ?Cough, unspecified type ? ?-we discussed possible serious and likely etiologies, options for evaluation and workup, limitations of telemedicine visit vs in person visit, treatment, treatment risks and precautions. Pt prefers to treat via telemedicine empirically rather than in person at this moment. Query VURI vs other. Influenza levels have been low in the community but discussed that could be a possibility as well. She has opted for Tessalon rx for cough, tylenol if needed.  ? ?Advised to seek prompt virtual visit or in person care if worsening, new symptoms arise, or if is not improving with treatment as expected per our conversation of expected course. Discussed options for follow up care. Did let this patient know that I do telemedicine on Tuesdays and Thursdays for [facility] and those are the days I am logged into the system. Advised to schedule follow up visit with PCP, [facility] virtual visits or [facility] if any further questions or concerns to avoid delays in care. ?  ?I discussed the assessment and treatment plan with the patient. The patient was provided an opportunity to ask questions and all were answered. The patient agreed with the plan and demonstrated an understanding of the instructions. ?  ? ?Follow Up Instructions: ? ?I did not refer this patient for an OV with me in the next 24 hours for this/these issue(s). ? ?I discussed the assessment and treatment plan with the patient. The patient was provided an opportunity to ask questions and all were answered. The patient agreed with the plan and demonstrated an understanding of the instructions. ? ? ?I spent 17 minutes on the date of this visit in the care of this patient. See summary of tasks completed to properly care for this patient in the detailed notes above which also included counseling of above, review of PMH, medications, allergies, evaluation of the patient and ordering and/or  instructing patient on testing and care options.    ? ? ?Terressa Koyanagi, DO  ? ?

## 2021-04-24 NOTE — Patient Instructions (Signed)
-  I sent the medication(s) we discussed to your pharmacy: ?Meds ordered this encounter  ?Medications  ? benzonatate (TESSALON PERLES) 100 MG capsule  ?  Sig: Take 1 capsule (100 mg total) by mouth 3 (three) times daily as needed.  ?  Dispense:  30 capsule  ?  Refill:  0  ? ?Nasal saline twice daily ? ?Tylenol per instructions if needed. ? ?I hope you are feeling better soon! ? ?Seek in person care promptly if you are having difficulty breathing, chest pain, your symptoms worsen, new concerns arise or you are not improving with treatment. ? ?It was nice to meet you today. I help [facility] out with telemedicine visits on Tuesdays and Thursdays and am happy to help if you need a virtual follow up visit on those days. Otherwise, if you have any concerns or questions following this visit please schedule a follow up visit with your Primary Care office or seek care at a local urgent care clinic to avoid delays in care ? ?

## 2021-05-01 ENCOUNTER — Ambulatory Visit
Admission: RE | Admit: 2021-05-01 | Discharge: 2021-05-01 | Disposition: A | Discharge status: Home / Self Care | Payer: Medicare Other | Source: Ambulatory Visit | Attending: Internal Medicine | Admitting: Internal Medicine

## 2021-05-01 DIAGNOSIS — Z1231 Encounter for screening mammogram for malignant neoplasm of breast: Secondary | ICD-10-CM | POA: Diagnosis not present

## 2021-05-14 ENCOUNTER — Other Ambulatory Visit: Payer: Self-pay | Admitting: Cardiovascular Disease

## 2021-05-14 ENCOUNTER — Telehealth: Payer: Self-pay | Admitting: Cardiology

## 2021-05-14 ENCOUNTER — Telehealth: Payer: Self-pay

## 2021-05-14 ENCOUNTER — Encounter: Payer: Self-pay | Admitting: Internal Medicine

## 2021-05-14 MED ORDER — FLECAINIDE ACETATE 150 MG PO TABS: 75.0000 mg | ORAL_TABLET | Freq: Two times a day (BID) | ORAL | 0 refills | Status: DC

## 2021-05-14 NOTE — Telephone Encounter (Signed)
Pt daughter calling to report that pt still have a cough and still coughing up mucus. ? ?FYI ?

## 2021-05-14 NOTE — Progress Notes (Signed)
? ? ?Subjective:  ? ? Patient ID: Susan Brady, female    DOB: 1945/09/25, 76 y.o.   MRN: [id number] ? ?This visit occurred during the SARS-CoV-2 public health emergency.  Safety protocols were in place, including screening questions prior to the visit, additional usage of staff PPE, and extensive cleaning of exam room while observing appropriate contact time as indicated for disinfecting solutions. ? ? ? ?HPI ?Tejal is here for  ?Chief Complaint  ?Patient presents with  ? Cough  ?  Cough x 2 weeks  ? ? ?She is here for an acute visit for cold symptoms.  ? ?Her symptoms started over a couple of week ago.  ? ?She is experiencing coughing up thick mucus chest tightness, occ wheeze, PND, tinnitus, occ headaches and lightheadedness. She has some body aches at night. She denies fever.  She does have some chest tightness on occasion.  She has occasional wheeze. ? ?She has tried taking nothing.  She was seen earlier this month for similar symptoms and she thinks they got better, but not sure if they ever went away completely. ? ? ? ? ?Medications and allergies reviewed with patient and updated if appropriate. ? ?Current Outpatient Medications on File Prior to Visit  ?Medication Sig Dispense Refill  ? acetaminophen (TYLENOL) 500 MG tablet Take 500-1,000 mg by mouth every 6 (six) hours as needed for mild pain.    ? aspirin EC 81 MG tablet Take 1 tablet (81 mg total) by mouth daily.    ? atorvastatin (LIPITOR) 20 MG tablet TAKE 1 TABLET BY MOUTH DAILY 90 tablet 1  ? benzonatate (TESSALON PERLES) 100 MG capsule Take 1 capsule (100 mg total) by mouth 3 (three) times daily as needed. 30 capsule 0  ? cholecalciferol (VITAMIN D) 1000 UNITS tablet Take 5,000 Units by mouth daily.     ? clotrimazole-betamethasone (LOTRISONE) cream Apply 1 application topically 2 (two) times daily. Apply between toes and to toenails twice daily for 6 weeks.Use gloves to apply. 45 g 1  ? flecainide (TAMBOCOR) 150 MG tablet Take 0.5 tablets (75 mg  total) by mouth 2 (two) times daily. Patient need keep a appointment for future refills. 90 tablet 0  ? furosemide (LASIX) 20 MG tablet TAKE 1 TABLET(20 MG) BY MOUTH TWICE DAILY 180 tablet 1  ? gabapentin (NEURONTIN) 100 MG capsule TAKE 2 CAPSULES(200 MG) BY MOUTH AT BEDTIME 60 capsule 0  ? Garlic [id number] MG CAPS Take 1,000 mg by mouth daily.    ? hydrALAZINE (APRESOLINE) 100 MG tablet Take 1 tablet (100 mg total) by mouth 3 (three) times daily. 270 tablet 2  ? metoprolol tartrate (LOPRESSOR) 25 MG tablet Take 1 tablet (25 mg total) by mouth 2 (two) times daily. 60 tablet 0  ? Omega-3 Fatty Acids (FISH OIL) 1000 MG CAPS Take 1 capsule by mouth daily.    ? pantoprazole (PROTONIX) 40 MG tablet TAKE 1 TABLET(40 MG) BY MOUTH DAILY 90 tablet 1  ? potassium chloride SA (KLOR-CON) 20 MEQ tablet TAKE 1 TABLET(20 MEQ) BY MOUTH DAILY 90 tablet 1  ? prednisoLONE acetate (PRED FORTE) 1 % ophthalmic suspension Place 1 drop into the right eye 3 (three) times daily.    ? triamcinolone cream (KENALOG) 0.1 % Apply 1 application topically 2 (two) times daily. (Patient taking differently: Apply 1 application. topically daily as needed (hands).) 30 g 0  ? vitamin E 400 UNIT capsule Take 1,000 Units by mouth daily.     ? hydrALAZINE (APRESOLINE) 50 MG  tablet Take by mouth.    ? ?No current facility-administered medications on file prior to visit.  ? ? ?Review of Systems  ?Constitutional:  Positive for diaphoresis. Negative for fever.  ?HENT:  Positive for postnasal drip and tinnitus. Negative for congestion, ear pain (ear popping), sinus pressure, sinus pain and sore throat.   ?Respiratory:  Positive for cough (thick clear mucus), chest tightness and wheezing (occ). Negative for shortness of breath.   ?Cardiovascular:  Negative for chest pain.  ?Gastrointestinal:  Negative for nausea.  ?Musculoskeletal:  Positive for myalgias.  ?Neurological:  Positive for light-headedness (sometimes) and headaches (occ).  ? ?   ?Objective:  ? ?Vitals:  ?  05/15/21 1553  ?BP: 140/82  ?Pulse: 71  ?Temp: 98.9 ?F (37.2 ?C)  ?SpO2: 95%  ? ?BP Readings from Last 3 Encounters:  ?05/15/21 140/82  ?02/28/21 (!) 148/90  ?12/12/20 (!) 146/77  ? ?Wt Readings from Last 3 Encounters:  ?05/15/21 199 lb (90.3 kg)  ?02/28/21 209 lb 6.4 oz (95 kg)  ?12/12/20 208 lb 15.9 oz (94.8 kg)  ? ?Body mass index is 27.75 kg/m?. ? ?  ?Physical Exam ?Constitutional:   ?   General: She is not in acute distress. ?   Appearance: Normal appearance. She is not ill-appearing.  ?HENT:  ?   Head: Normocephalic and atraumatic.  ?   Right Ear: Tympanic membrane, ear canal and external ear normal.  ?   Left Ear: Tympanic membrane, ear canal and external ear normal.  ?   Mouth/Throat:  ?   Mouth: Mucous membranes are moist.  ?   Pharynx: No oropharyngeal exudate or posterior oropharyngeal erythema.  ?Eyes:  ?   Conjunctiva/sclera: Conjunctivae normal.  ?Cardiovascular:  ?   Rate and Rhythm: Normal rate and regular rhythm.  ?Pulmonary:  ?   Effort: Pulmonary effort is normal. No respiratory distress.  ?   Breath sounds: Rales (occ - clears with cough) present. No wheezing.  ?Musculoskeletal:  ?   Cervical back: Neck supple. No tenderness.  ?Lymphadenopathy:  ?   Cervical: No cervical adenopathy.  ?Skin: ?   General: Skin is warm and dry.  ?Neurological:  ?   Mental Status: She is alert.  ? ?   ? ? ? ? ? ?Assessment & Plan:  ? ? ?See Problem List for Assessment and Plan of chronic medical problems.  ? ? ? ? ?

## 2021-05-14 NOTE — Telephone Encounter (Signed)
?*  STAT* If patient is at the pharmacy, call can be transferred to refill team. ? ? ?1. Which medications need to be refilled? (please list name of each medication and dose if known) new prescription for Flecainide, changing pharmacy ? ?2. Which pharmacy/location (including street and city if local pharmacy) is medication to be sent to? [facility]  [place], [place], ? ?3. Do they need a 30 day or 90 day supply? 90 days and refills ? ?

## 2021-05-14 NOTE — Telephone Encounter (Signed)
Appointment made

## 2021-05-15 ENCOUNTER — Ambulatory Visit (INDEPENDENT_AMBULATORY_CARE_PROVIDER_SITE_OTHER): Payer: Medicare Other | Admitting: Internal Medicine

## 2021-05-15 DIAGNOSIS — J988 Other specified respiratory disorders: Secondary | ICD-10-CM | POA: Insufficient documentation

## 2021-05-15 MED ORDER — CEFDINIR 300 MG PO CAPS: 300.0000 mg | ORAL_CAPSULE | Freq: Two times a day (BID) | ORAL | 0 refills | Status: DC

## 2021-05-15 MED ORDER — BENZONATATE 100 MG PO CAPS: 100.0000 mg | ORAL_CAPSULE | Freq: Three times a day (TID) | ORAL | 0 refills | Status: DC | PRN

## 2021-05-15 NOTE — Patient Instructions (Addendum)
? ?  ? ? ?  Medications changes include :  cough pills and an antibiotic.    ? ? ?Your prescription(s) have been sent to your pharmacy.  ? ? ? ? ?Return if symptoms worsen or fail to improve. ? ?

## 2021-05-15 NOTE — Assessment & Plan Note (Signed)
Acute ?Has had somewhat of a prolonged illness-was sick at the beginning of this month and thinks she got better, but sounds like her symptoms may have just gotten worse ?Having a cough with thick sputum that does not seem to be improving for over a week ?Start Omnicef 300 mg twice daily x10 days ?Tessalon Perles 100 mg 3 times daily as needed ?Rest, fluids ?Call if no improvement ?

## 2021-06-18 NOTE — Progress Notes (Signed)
?  ?Cardiology Office Note ? ? ?Date:  06/20/2021  ? ?ID:  Susan Brady, DOB 03-Dec-1945, MRN [id number] ? ?PCP:  Pincus Sanes, MD  ?Cardiologist:   Rollene Rotunda, MD  ? ? ?Chief Complaint  ?Patient presents with  ? Palpitations  ? ? ?  ?History of Present Illness: ?Susan Brady is a 76 y.o. female who presents for follow up of chest pain.  She did have an abnormal Lexiscan Myoview.  However she had normal coronaries on cath.  She did have cardiomegaly on CXR but had an echo with NL LV function.  There was grade two diastolic dysfunction.   She had palpitations and had a monitor which demonstrated frequent sustained SVT.   She saw Dr. Graciela Husbands who felt that this was atrial tach.  She was treated with flecainide.  She had some breakthrough tachycardia previously so I did increase her flecainide from 50 twice a day to 75 twice a day.  ? ?She presents for follow up.  Since I last saw her she does occasionally get palpitations but she does not think they are particularly bad.  They happen sometimes at night and they are self-limited.  She has not had any presyncope or syncope.  She feels better with current dose of medications.  Her resting heart rate does run low and she notices this at times but she is not had any passing out with it. ? ? ?Past Medical History:  ?Diagnosis Date  ? Anemia   ? CHF (congestive heart failure) (HCC)   ? Diabetes mellitus without complication (HCC)   ? Gastric ulcer   ? Glaucoma   ? Hiatal hernia   ? Hyperlipidemia   ? Hypertension   ? ? ?Past Surgical History:  ?Procedure Laterality Date  ? LEFT HEART CATH AND CORONARY ANGIOGRAPHY N/A 07/25/2016  ? Procedure: Left Heart Cath and Coronary Angiography;  Surgeon: Kathleene Hazel, MD;  Location: [facility];  Service: Cardiovascular;  Laterality: N/A;  ? ? ? ?Current Outpatient Medications  ?Medication Sig Dispense Refill  ? acetaminophen (TYLENOL) 500 MG tablet Take 500-1,000 mg by mouth every 6 (six) hours as needed for mild  pain.    ? atorvastatin (LIPITOR) 20 MG tablet TAKE 1 TABLET BY MOUTH DAILY 90 tablet 1  ? cholecalciferol (VITAMIN D) 1000 UNITS tablet Take 5,000 Units by mouth daily.     ? flecainide (TAMBOCOR) 150 MG tablet Take 0.5 tablets (75 mg total) by mouth 2 (two) times daily. Patient need keep a appointment for future refills. 90 tablet 0  ? furosemide (LASIX) 20 MG tablet TAKE 1 TABLET(20 MG) BY MOUTH TWICE DAILY 180 tablet 1  ? Garlic 1000 MG CAPS Take 1,000 mg by mouth daily.    ? hydrALAZINE (APRESOLINE) 100 MG tablet Take 1 tablet (100 mg total) by mouth 3 (three) times daily. 270 tablet 2  ? metoprolol succinate (TOPROL XL) 25 MG 24 hr tablet Take 1 tablet (25 mg total) by mouth daily. 90 tablet 3  ? Omega-3 Fatty Acids (FISH OIL) 1000 MG CAPS Take 1 capsule by mouth daily.    ? pantoprazole (PROTONIX) 40 MG tablet TAKE 1 TABLET(40 MG) BY MOUTH DAILY 90 tablet 1  ? potassium chloride SA (KLOR-CON) 20 MEQ tablet TAKE 1 TABLET(20 MEQ) BY MOUTH DAILY 90 tablet 1  ? vitamin E 400 UNIT capsule Take 1,000 Units by mouth daily.     ? ?No current facility-administered medications for this visit.  ? ? ?Allergies:  Lisinopril  ? ? ?ROS:  Please see the history of present illness.   Otherwise, review of systems are positive for none.   All other systems are reviewed and negative.  ? ? ?PHYSICAL EXAM: ?VS:  BP 126/80   Pulse (!) 44   Ht 5\' 11"  (1.803 m)   Wt 202 lb 6.4 oz (91.8 kg)   SpO2 95%   BMI 28.23 kg/m?  , BMI Body mass index is 28.23 kg/m?. ?GENERAL:  Well appearing ?NECK:  No jugular venous distention, waveform within normal limits, carotid upstroke brisk and symmetric, no bruits, no thyromegaly ?LUNGS:  Clear to auscultation bilaterally ?CHEST:  Unremarkable ?HEART:  PMI not displaced or sustained,S1 and S2 within normal limits, no S3, no S4, no clicks, no rubs, no murmurs ?ABD:  Flat, positive bowel sounds normal in frequency in pitch, no bruits, no rebound, no guarding, no midline pulsatile mass, no  hepatomegaly, no splenomegaly ?EXT:  2 plus pulses throughout, no edema, no cyanosis no clubbing ? ?EKG:  EKG is  ordered today. ?Sinus bradycardia, rate 44, left axis deviation, left anterior fascicular block, poor anterior R wave progression, nonspecific T wave flattening, intervals within normal limits ? ? ?Recent Labs: ?12/12/2020: Hemoglobin 11.3; Platelets 289 ?02/28/2021: ALT 11; BUN 11; Creatinine, Ser 0.71; Potassium 3.9; Sodium 140  ? ? ?Lipid Panel ?   ?Component Value Date/Time  ? CHOL 146 02/28/2021 0943  ? TRIG 61.0 02/28/2021 0943  ? HDL 72.10 02/28/2021 0943  ? CHOLHDL 2 02/28/2021 0943  ? VLDL 12.2 02/28/2021 0943  ? LDLCALC 62 02/28/2021 0943  ? ?  ? ?Wt Readings from Last 3 Encounters:  ?06/20/21 202 lb 6.4 oz (91.8 kg)  ?05/15/21 199 lb (90.3 kg)  ?02/28/21 209 lb 6.4 oz (95 kg)  ?  ? ? ?Other studies Reviewed: ?Additional studies/ records that were reviewed today include: Labs ?Review of the above records demonstrates:  Please see elsewhere in the note. ? ? ?ASSESSMENT AND PLAN: ? ? ?SVT ?She tolerates current dose of medications.  She did not tolerate previous higher dose of flecainide but she is doing well with the current regimen.  I am going to reduce her beta-blocker to metoprolol XL 25 mg daily because of some bradycardia arrhythmias.  ? ?Essential hypertension ?The blood pressure is controlled.  No change in therapy. ?  ?Cardiomegaly ?This was normal on her most recent echocardiogram  in 2018.  No further imaging.   ? ?Sleep apnea  ?I did review these records from a couple of years ago because she is not currently being treated for this.  This was mild sleep apnea but she is not having any overt symptoms and no change.   ? ?Current medicines are reviewed at length with the patient today.  The patient does not have concerns regarding medicines. ?  ?The following changes have been made: As above ? ?Labs/ tests ordered today include:    ?Orders Placed This Encounter  ?Procedures  ? EKG  12-Lead  ? ? ? ?Disposition:   FU with me in 12 months.   ? ? ?Signed, ?2019, MD  ?06/20/2021 10:45 AM    ?[facility] ?

## 2021-06-20 ENCOUNTER — Ambulatory Visit (INDEPENDENT_AMBULATORY_CARE_PROVIDER_SITE_OTHER): Payer: Medicare Other | Admitting: Cardiology

## 2021-06-20 ENCOUNTER — Encounter: Payer: Self-pay | Admitting: Cardiology

## 2021-06-20 VITALS — BP 126/80 | HR 44 | Ht 71.0 in | Wt 202.4 lb

## 2021-06-20 DIAGNOSIS — I471 Supraventricular tachycardia: Secondary | ICD-10-CM

## 2021-06-20 DIAGNOSIS — I517 Cardiomegaly: Secondary | ICD-10-CM | POA: Diagnosis not present

## 2021-06-20 DIAGNOSIS — I10 Essential (primary) hypertension: Secondary | ICD-10-CM

## 2021-06-20 MED ORDER — METOPROLOL SUCCINATE ER 25 MG PO TB24: 25.0000 mg | ORAL_TABLET | Freq: Every day | ORAL | 3 refills | Status: DC

## 2021-06-20 MED ORDER — METOPROLOL TARTRATE 25 MG PO TABS: 25.0000 mg | ORAL_TABLET | Freq: Every day | ORAL | 3 refills | Status: DC

## 2021-06-20 NOTE — Patient Instructions (Addendum)
Medication Instructions:  ?STOP metoprolol tartrate (Lopressor) ?START metoprolol succinate (Toprol XL) 25 mg daily ? ?*If you need a refill on your cardiac medications before your next appointment, please call your pharmacy* ? ?Follow-Up: ?At [facility], you and your health needs are our priority.  As part of our continuing mission to provide you with exceptional heart care, we have created designated Provider Care Teams.  These Care Teams include your primary Cardiologist (physician) and Advanced Practice Providers (APPs -  Physician Assistants and Nurse Practitioners) who all work together to provide you with the care you need, when you need it. ? ?We recommend signing up for the patient portal called "MyChart".  Sign up information is provided on this After Visit Summary.  MyChart is used to connect with patients for Virtual Visits (Telemedicine).  Patients are able to view lab/test results, encounter notes, upcoming appointments, etc.  Non-urgent messages can be sent to your provider as well.   ?To learn more about what you can do with MyChart, go to [email or web address].   ? ?Your next appointment:   ?12 month(s) ? ?The format for your next appointment:   ?In Person ? ?Provider:   ?Dr. Antoine Poche ? ? ? ?Important Information About Sugar ? ? ? ? ? ? ?

## 2021-06-27 ENCOUNTER — Other Ambulatory Visit: Payer: Self-pay | Admitting: Internal Medicine

## 2021-07-05 DIAGNOSIS — H5203 Hypermetropia, bilateral: Secondary | ICD-10-CM | POA: Diagnosis not present

## 2021-07-05 DIAGNOSIS — H35033 Hypertensive retinopathy, bilateral: Secondary | ICD-10-CM | POA: Diagnosis not present

## 2021-07-05 DIAGNOSIS — H52223 Regular astigmatism, bilateral: Secondary | ICD-10-CM | POA: Diagnosis not present

## 2021-07-05 DIAGNOSIS — H524 Presbyopia: Secondary | ICD-10-CM | POA: Diagnosis not present

## 2021-07-05 DIAGNOSIS — H2513 Age-related nuclear cataract, bilateral: Secondary | ICD-10-CM | POA: Diagnosis not present

## 2021-08-07 NOTE — Progress Notes (Deleted)
    Subjective:    Patient ID: Susan Brady, female    DOB: 11-10-1945, 76 y.o.   MRN: [id number]      HPI Thara is here for No chief complaint on file.    Her symptoms started   She is experiencing   She has tried taking       Medications and allergies reviewed with patient and updated if appropriate.  Current Outpatient Medications on File Prior to Visit  Medication Sig Dispense Refill   acetaminophen (TYLENOL) 500 MG tablet Take 500-1,000 mg by mouth every 6 (six) hours as needed for mild pain.     atorvastatin (LIPITOR) 20 MG tablet TAKE 1 TABLET BY MOUTH DAILY 90 tablet 1   cholecalciferol (VITAMIN D) 1000 UNITS tablet Take 5,000 Units by mouth daily.      flecainide (TAMBOCOR) 150 MG tablet Take 0.5 tablets (75 mg total) by mouth 2 (two) times daily. Patient need keep a appointment for future refills. 90 tablet 0   furosemide (LASIX) 20 MG tablet TAKE 1 TABLET(20 MG) BY MOUTH TWICE DAILY 180 tablet 1   Garlic 1000 MG CAPS Take 1,000 mg by mouth daily.     hydrALAZINE (APRESOLINE) 100 MG tablet Take 1 tablet (100 mg total) by mouth 3 (three) times daily. 270 tablet 2   metoprolol succinate (TOPROL XL) 25 MG 24 hr tablet Take 1 tablet (25 mg total) by mouth daily. 90 tablet 3   Omega-3 Fatty Acids (FISH OIL) 1000 MG CAPS Take 1 capsule by mouth daily.     pantoprazole (PROTONIX) 40 MG tablet TAKE 1 TABLET(40 MG) BY MOUTH DAILY 90 tablet 2   potassium chloride SA (KLOR-CON M) 20 MEQ tablet TAKE 1 TABLET(20 MEQ) BY MOUTH DAILY 90 tablet 2   vitamin E 400 UNIT capsule Take 1,000 Units by mouth daily.      No current facility-administered medications on file prior to visit.    Review of Systems     Objective:  There were no vitals filed for this visit. BP Readings from Last 3 Encounters:  06/20/21 126/80  05/15/21 140/82  02/28/21 (!) 148/90   Wt Readings from Last 3 Encounters:  06/20/21 202 lb 6.4 oz (91.8 kg)  05/15/21 199 lb (90.3 kg)  02/28/21 209 lb 6.4  oz (95 kg)   There is no height or weight on file to calculate BMI.    Physical Exam         Assessment & Plan:    See Problem List for Assessment and Plan of chronic medical problems.

## 2021-08-08 ENCOUNTER — Ambulatory Visit: Payer: Medicare Other | Admitting: Internal Medicine

## 2021-08-11 ENCOUNTER — Other Ambulatory Visit: Payer: Self-pay | Admitting: Cardiology

## 2021-09-24 ENCOUNTER — Other Ambulatory Visit: Payer: Self-pay

## 2021-09-24 ENCOUNTER — Telehealth: Payer: Self-pay | Admitting: Internal Medicine

## 2021-09-24 MED ORDER — ATORVASTATIN CALCIUM 20 MG PO TABS: 20.0000 mg | ORAL_TABLET | Freq: Every day | ORAL | 1 refills | Status: DC

## 2021-09-24 NOTE — Telephone Encounter (Signed)
Pt's daughter is requesting a refill of her atorvastatin (LIPITOR) 20 MG tablet be sent to [id number]   Phone:  [phone]  Fax:  [phone]

## 2021-09-24 NOTE — Telephone Encounter (Signed)
Sent in today 

## 2021-09-26 ENCOUNTER — Telehealth: Payer: Self-pay | Admitting: Internal Medicine

## 2021-09-26 MED ORDER — FUROSEMIDE 20 MG PO TABS: ORAL_TABLET | ORAL | 0 refills | Status: DC

## 2021-09-26 NOTE — Telephone Encounter (Signed)
Pt's daughter is requesting a refill of her furosemide (LASIX) 20 MG tablet. Please send the refill to [facility] #[id number]  Phone:  [phone]  Fax:  [phone]

## 2021-09-26 NOTE — Telephone Encounter (Signed)
Notified pt rx sent to [facility]  

## 2021-12-04 NOTE — Progress Notes (Deleted)
    Subjective:    Patient ID: Susan Brady, female    DOB: 02-05-1946, 76 y.o.   MRN: [id number]      HPI Semya is here for No chief complaint on file.   She is here for an acute visit for cold symptoms.   Her symptoms started   She is experiencing   She has tried taking       Medications and allergies reviewed with patient and updated if appropriate.  Current Outpatient Medications on File Prior to Visit  Medication Sig Dispense Refill   acetaminophen (TYLENOL) 500 MG tablet Take 500-1,000 mg by mouth every 6 (six) hours as needed for mild pain.     atorvastatin (LIPITOR) 20 MG tablet Take 1 tablet (20 mg total) by mouth daily. 90 tablet 1   cholecalciferol (VITAMIN D) 1000 UNITS tablet Take 5,000 Units by mouth daily.      flecainide (TAMBOCOR) 150 MG tablet TAKE 1/2 TABLET(75 MG) BY MOUTH TWICE DAILY 90 tablet 3   furosemide (LASIX) 20 MG tablet TAKE 1 TABLET(20 MG) BY MOUTH TWICE DAILY 297 tablet 0   Garlic 9892 MG CAPS Take 1,000 mg by mouth daily.     hydrALAZINE (APRESOLINE) 100 MG tablet Take 1 tablet (100 mg total) by mouth 3 (three) times daily. 270 tablet 2   metoprolol succinate (TOPROL XL) 25 MG 24 hr tablet Take 1 tablet (25 mg total) by mouth daily. 90 tablet 3   Omega-3 Fatty Acids (FISH OIL) 1000 MG CAPS Take 1 capsule by mouth daily.     pantoprazole (PROTONIX) 40 MG tablet TAKE 1 TABLET(40 MG) BY MOUTH DAILY 90 tablet 2   potassium chloride SA (KLOR-CON M) 20 MEQ tablet TAKE 1 TABLET(20 MEQ) BY MOUTH DAILY 90 tablet 2   vitamin E 400 UNIT capsule Take 1,000 Units by mouth daily.      No current facility-administered medications on file prior to visit.    Review of Systems     Objective:  There were no vitals filed for this visit. BP Readings from Last 3 Encounters:  06/20/21 126/80  05/15/21 140/82  02/28/21 (!) 148/90   Wt Readings from Last 3 Encounters:  06/20/21 202 lb 6.4 oz (91.8 kg)  05/15/21 199 lb (90.3 kg)  02/28/21 209 lb 6.4 oz  (95 kg)   There is no height or weight on file to calculate BMI.    Physical Exam         Assessment & Plan:    See Problem List for Assessment and Plan of chronic medical problems.

## 2021-12-05 ENCOUNTER — Telehealth: Payer: Self-pay

## 2021-12-05 ENCOUNTER — Telehealth: Payer: Medicare Other | Admitting: Internal Medicine

## 2021-12-06 ENCOUNTER — Encounter: Payer: Self-pay | Admitting: Internal Medicine

## 2021-12-06 NOTE — Progress Notes (Signed)
Subjective:    Patient ID: Susan Brady, female    DOB: 11-Nov-1945, 76 y.o.   MRN: [id number]      HPI Jenalyn is here for  Chief Complaint  Patient presents with   Sinus Problem    Possibly started about over a week. Along with a cough. Scratchy throat, itchy eyes and throat. Also having some ear problems as well    She is here for an acute visit for cold symptoms.   Her symptoms started over one week ago.  Her symptoms are not improving.  She is experiencing nasal congestion, left-sided ear pain or irritation, postnasal drip, runny nose, sore throat, Cough that is productive at times, shortness of breath at times, wheezing, dizziness and lightheadedness.  She has tried taking robitussin      Medications and allergies reviewed with patient and updated if appropriate.  Current Outpatient Medications on File Prior to Visit  Medication Sig Dispense Refill   acetaminophen (TYLENOL) 500 MG tablet Take 500-1,000 mg by mouth every 6 (six) hours as needed for mild pain.     atorvastatin (LIPITOR) 20 MG tablet Take 1 tablet (20 mg total) by mouth daily. 90 tablet 1   cholecalciferol (VITAMIN D) 1000 UNITS tablet Take 5,000 Units by mouth daily.      flecainide (TAMBOCOR) 150 MG tablet TAKE 1/2 TABLET(75 MG) BY MOUTH TWICE DAILY 90 tablet 3   furosemide (LASIX) 20 MG tablet TAKE 1 TABLET(20 MG) BY MOUTH TWICE DAILY 643 tablet 0   Garlic 3295 MG CAPS Take 1,000 mg by mouth daily.     hydrALAZINE (APRESOLINE) 100 MG tablet Take 1 tablet (100 mg total) by mouth 3 (three) times daily. 270 tablet 2   metoprolol succinate (TOPROL XL) 25 MG 24 hr tablet Take 1 tablet (25 mg total) by mouth daily. 90 tablet 3   Omega-3 Fatty Acids (FISH OIL) 1000 MG CAPS Take 1 capsule by mouth daily.     pantoprazole (PROTONIX) 40 MG tablet TAKE 1 TABLET(40 MG) BY MOUTH DAILY 90 tablet 2   potassium chloride SA (KLOR-CON M) 20 MEQ tablet TAKE 1 TABLET(20 MEQ) BY MOUTH DAILY 90 tablet 2   vitamin E 400  UNIT capsule Take 1,000 Units by mouth daily.      No current facility-administered medications on file prior to visit.    Review of Systems  Constitutional:  Negative for appetite change and fever.  HENT:  Positive for congestion, ear pain (left), postnasal drip, rhinorrhea and sore throat. Negative for sinus pressure and sinus pain.   Respiratory:  Positive for cough (productve at times), shortness of breath (sometimes) and wheezing (at night).   Gastrointestinal:  Negative for diarrhea and nausea.  Musculoskeletal:  Negative for myalgias.  Neurological:  Positive for dizziness and light-headedness. Negative for headaches.       Objective:   Vitals:   12/07/21 1408  BP: 120/60  Pulse: 71  Temp: 98.5 F (36.9 C)  SpO2: 96%   BP Readings from Last 3 Encounters:  12/07/21 120/60  06/20/21 126/80  05/15/21 140/82   Wt Readings from Last 3 Encounters:  12/07/21 199 lb (90.3 kg)  06/20/21 202 lb 6.4 oz (91.8 kg)  05/15/21 199 lb (90.3 kg)   Body mass index is 27.75 kg/m.    Physical Exam Constitutional:      General: She is not in acute distress.    Appearance: Normal appearance. She is not ill-appearing.  HENT:     Head: Normocephalic  and atraumatic.     Right Ear: Tympanic membrane, ear canal and external ear normal.     Left Ear: Tympanic membrane, ear canal and external ear normal.     Mouth/Throat:     Mouth: Mucous membranes are moist.     Pharynx: No oropharyngeal exudate or posterior oropharyngeal erythema.  Eyes:     Conjunctiva/sclera: Conjunctivae normal.  Cardiovascular:     Rate and Rhythm: Normal rate and regular rhythm.  Pulmonary:     Effort: Pulmonary effort is normal. No respiratory distress.     Breath sounds: Normal breath sounds. No wheezing or rales.  Musculoskeletal:     Cervical back: Neck supple. No tenderness.  Lymphadenopathy:     Cervical: No cervical adenopathy.  Skin:    General: Skin is warm and dry.  Neurological:     Mental  Status: She is alert.            Assessment & Plan:    See Problem List for Assessment and Plan of chronic medical problems.

## 2021-12-07 ENCOUNTER — Telehealth: Payer: Self-pay

## 2021-12-07 ENCOUNTER — Ambulatory Visit (INDEPENDENT_AMBULATORY_CARE_PROVIDER_SITE_OTHER): Payer: Medicare Other

## 2021-12-07 ENCOUNTER — Other Ambulatory Visit: Payer: Self-pay | Admitting: Internal Medicine

## 2021-12-07 ENCOUNTER — Ambulatory Visit (INDEPENDENT_AMBULATORY_CARE_PROVIDER_SITE_OTHER): Payer: Medicare Other | Admitting: Internal Medicine

## 2021-12-07 VITALS — BP 120/60 | HR 71 | Temp 98.5°F | Ht 71.0 in | Wt 199.0 lb

## 2021-12-07 DIAGNOSIS — J209 Acute bronchitis, unspecified: Secondary | ICD-10-CM | POA: Diagnosis not present

## 2021-12-07 DIAGNOSIS — I10 Essential (primary) hypertension: Secondary | ICD-10-CM

## 2021-12-07 DIAGNOSIS — R059 Cough, unspecified: Secondary | ICD-10-CM | POA: Diagnosis not present

## 2021-12-07 DIAGNOSIS — R062 Wheezing: Secondary | ICD-10-CM | POA: Diagnosis not present

## 2021-12-07 DIAGNOSIS — G479 Sleep disorder, unspecified: Secondary | ICD-10-CM

## 2021-12-07 MED ORDER — HYDROCODONE BIT-HOMATROP MBR 5-1.5 MG/5ML PO SOLN: 5.0000 mL | Freq: Three times a day (TID) | ORAL | 0 refills | Status: DC | PRN

## 2021-12-07 MED ORDER — AMOXICILLIN-POT CLAVULANATE 875-125 MG PO TABS: 1.0000 | ORAL_TABLET | Freq: Two times a day (BID) | ORAL | 0 refills | Status: DC

## 2021-12-07 NOTE — Assessment & Plan Note (Addendum)
Acute Concern for bacterial cause and high risk of complications given age and medical problems Chest x-ray today Start Augmentin 875-125 mg BID x 10 day, Hycodan cough syrup otc cold medications Rest, fluid Call if no improvement

## 2021-12-07 NOTE — Assessment & Plan Note (Signed)
Chronic Blood pressure well controlled Continue hydralazine 100 mg 3 times daily, metoprolol XL 25 mg daily

## 2021-12-07 NOTE — Telephone Encounter (Signed)
Contacted pt and left a voicemail

## 2021-12-07 NOTE — Telephone Encounter (Signed)
-----   Message from Binnie Rail, MD sent at 12/07/2021  4:18 PM EDT ----- Your chest x-ray is normal-no evidence of pneumonia.

## 2021-12-07 NOTE — Patient Instructions (Addendum)
     Have a chest  downstairs    Medications changes include :   cough syrup and augmentin     Return if symptoms worsen or fail to improve.

## 2021-12-07 NOTE — Telephone Encounter (Signed)
Spoke with patient again and was able to give her the results.

## 2021-12-28 ENCOUNTER — Other Ambulatory Visit: Payer: Self-pay | Admitting: Internal Medicine

## 2022-01-02 ENCOUNTER — Emergency Department (HOSPITAL_COMMUNITY): Payer: Medicare Other

## 2022-01-02 ENCOUNTER — Encounter: Payer: Self-pay | Admitting: Family Medicine

## 2022-01-02 ENCOUNTER — Emergency Department (HOSPITAL_COMMUNITY)
Admission: EM | Admit: 2022-01-02 | Discharge: 2022-01-02 | Discharge status: Against Medical Advice | Payer: Medicare Other | Attending: Physician Assistant | Admitting: Physician Assistant

## 2022-01-02 ENCOUNTER — Encounter (HOSPITAL_COMMUNITY): Payer: Self-pay

## 2022-01-02 ENCOUNTER — Other Ambulatory Visit: Payer: Self-pay

## 2022-01-02 ENCOUNTER — Ambulatory Visit (INDEPENDENT_AMBULATORY_CARE_PROVIDER_SITE_OTHER): Payer: Medicare Other | Admitting: Family Medicine

## 2022-01-02 VITALS — BP 134/70 | HR 58 | Temp 97.6°F | Ht 67.0 in | Wt 201.0 lb

## 2022-01-02 DIAGNOSIS — R0989 Other specified symptoms and signs involving the circulatory and respiratory systems: Secondary | ICD-10-CM | POA: Insufficient documentation

## 2022-01-02 DIAGNOSIS — H5712 Ocular pain, left eye: Secondary | ICD-10-CM | POA: Diagnosis not present

## 2022-01-02 DIAGNOSIS — R930 Abnormal findings on diagnostic imaging of skull and head, not elsewhere classified: Secondary | ICD-10-CM | POA: Insufficient documentation

## 2022-01-02 DIAGNOSIS — R519 Headache, unspecified: Secondary | ICD-10-CM | POA: Insufficient documentation

## 2022-01-02 DIAGNOSIS — Z1152 Encounter for screening for COVID-19: Secondary | ICD-10-CM | POA: Insufficient documentation

## 2022-01-02 DIAGNOSIS — R0981 Nasal congestion: Secondary | ICD-10-CM | POA: Diagnosis not present

## 2022-01-02 DIAGNOSIS — E236 Other disorders of pituitary gland: Secondary | ICD-10-CM

## 2022-01-02 DIAGNOSIS — Z5321 Procedure and treatment not carried out due to patient leaving prior to being seen by health care provider: Secondary | ICD-10-CM | POA: Insufficient documentation

## 2022-01-02 DIAGNOSIS — Z20822 Contact with and (suspected) exposure to covid-19: Secondary | ICD-10-CM | POA: Diagnosis not present

## 2022-01-02 DIAGNOSIS — J341 Cyst and mucocele of nose and nasal sinus: Secondary | ICD-10-CM

## 2022-01-02 LAB — CBC
HCT: 37.8 % (ref 36.0–46.0)
Hemoglobin: 11.8 g/dL — ABNORMAL LOW (ref 12.0–15.0)
MCH: 28.4 pg (ref 26.0–34.0)
MCHC: 31.2 g/dL (ref 30.0–36.0)
MCV: 91.1 fL (ref 80.0–100.0)
Platelets: 274 10*3/uL (ref 150–400)
RBC: 4.15 MIL/uL (ref 3.87–5.11)
RDW: 13.5 % (ref 11.5–15.5)
WBC: 5.7 10*3/uL (ref 4.0–10.5)
nRBC: 0 % (ref 0.0–0.2)

## 2022-01-02 LAB — RESP PANEL BY RT-PCR (FLU A&B, COVID) ARPGX2
Influenza A by PCR: NEGATIVE
Influenza B by PCR: NEGATIVE
SARS Coronavirus 2 by RT PCR: NEGATIVE

## 2022-01-02 LAB — BASIC METABOLIC PANEL
Anion gap: 9 (ref 5–15)
BUN: 13 mg/dL (ref 8–23)
CO2: 26 mmol/L (ref 22–32)
Calcium: 9.1 mg/dL (ref 8.9–10.3)
Chloride: 107 mmol/L (ref 98–111)
Creatinine, Ser: 0.82 mg/dL (ref 0.44–1.00)
GFR, Estimated: >60 mL/min (ref >60)
Glucose, Bld: 114 mg/dL — ABNORMAL HIGH (ref 70–99)
Potassium: 3.8 mmol/L (ref 3.5–5.1)
Sodium: 142 mmol/L (ref 135–145)

## 2022-01-02 LAB — SEDIMENTATION RATE: Sed Rate: 17 mm/hr (ref 0–30)

## 2022-01-02 MED ORDER — KETOROLAC TROMETHAMINE 30 MG/ML IJ SOLN
30.0000 mg | Freq: Once | INTRAMUSCULAR | Status: AC
  Administered 2022-01-02: 30 mg via INTRAMUSCULAR

## 2022-01-02 NOTE — Progress Notes (Signed)
The blood test we did for inflammation is negative. No changes needed to the plan we discussed.

## 2022-01-02 NOTE — ED Provider Triage Note (Signed)
Emergency Medicine Provider Triage Evaluation Note  Susan Brady , a 76 y.o. female  was evaluated in triage.  Pt complains of left-sided headache, sharp pain, nasal congestion with runny nose.  Onset on Sunday.  Has taken Tylenol with little relief.  Denies any vision changes, difficulty speaking, weakness.  Daughter at bedside reports history of mild dementia.  No known fevers but has felt chills.  Review of Systems  Positive: As above  Negative: As above   Physical Exam  BP (!) 146/109 (BP Location: Right Arm)   Pulse 61   Temp 98.5 F (36.9 C) (Oral)   Resp 18   Ht 5\' 7"  (1.702 m)   Wt 95.3 kg   SpO2 98%   BMI 32.89 kg/m  Gen:   Awake, no distress   Resp:  Normal effort  MSK:   Moves extremities without difficulty  Other:  Mental Status:  Alert, thought content appropriate, able to give a coherent history. Speech fluent without evidence of aphasia. Able to follow 2 step commands without difficulty.  Cranial Nerves:  II:  Peripheral visual fields grossly normal, pupils equal, round, reactive to light III,IV, VI: ptosis not present, extra-ocular motions intact bilaterally  V,VII: smile symmetric, facial light touch sensation equal VIII: hearing grossly normal to voice  X: uvula elevates symmetrically  XI: bilateral shoulder shrug symmetric and strong XII: midline tongue extension without fassiculations Motor:  Normal tone. 5/5 strength of BUE and BLE major muscle groups including strong and equal grip strength and dorsiflexion/plantar flexion Sensory: light touch normal in all extremities. Cerebellar: normal finger-to-nose with bilateral upper extremities, Romberg sign absent Gait: not accessed    Medical Decision Making  Medically screening exam initiated at 3:52 AM.  Appropriate orders placed.  Nekia Maxham was informed that the remainder of the evaluation will be completed by another provider, this initial triage assessment does not replace that evaluation, and the  importance of remaining in the ED until their evaluation is complete.     Liz Beach, PA-C 01/02/22 [phone]

## 2022-01-02 NOTE — ED Triage Notes (Signed)
Runny nose and left sided headache x 3 days.   Described as a sharp pain behind left eye radiating around head to left shoulder.

## 2022-01-02 NOTE — Patient Instructions (Signed)
Please go to the lab before you leave today.   You received a Toradol injection today in our office.   Do not take Excedrin.  You may take over the counter Tylenol 500 mg or 1,000 mg twice daily if needed.   I referred you to neurology and ENT and they will call you to schedule your appointments.

## 2022-01-02 NOTE — Progress Notes (Signed)
Subjective:     Patient ID: Susan Brady, female    DOB: Jun 02, 1945, 76 y.o.   MRN: [id number]  Chief Complaint  Patient presents with   Neck Pain   Eye Pain    Left sided head pain 3-4 days ago but unbearable pain starting 2am this morning. States it starts in the eye and radiates to the back of the head (left)    Neck Pain   Eye Pain    Patient is in today for a 3-4 day hx of left sided headache that radiates down the left ear and left sinus.   She was seen in the ED last night for the same. Labs and CT head done. She left before getting results.   Headache improved with Excedrin.   Denies fever, chills, dizziness, chest pain, palpitations, shortness of breath, abdominal pain, N/V/D, urinary symptoms, LE edema.    Health Maintenance Due  Topic Date Due   OPHTHALMOLOGY EXAM  Never done   TETANUS/TDAP  Never done   Zoster Vaccines- Shingrix (1 of 2) Never done   COVID-19 Vaccine (3 - Moderna risk series) 05/26/2019   Diabetic kidney evaluation - Urine ACR  08/28/2021   HEMOGLOBIN A1C  08/28/2021   INFLUENZA VACCINE  09/18/2021   Medicare Annual Wellness (AWV)  02/07/2022    Past Medical History:  Diagnosis Date   Anemia    CHF (congestive heart failure) (HCC)    Diabetes mellitus without complication (HCC)    Gastric ulcer    Glaucoma    Hiatal hernia    Hyperlipidemia    Hypertension     Past Surgical History:  Procedure Laterality Date   LEFT HEART CATH AND CORONARY ANGIOGRAPHY N/A 07/25/2016   Procedure: Left Heart Cath and Coronary Angiography;  Surgeon: Kathleene Hazel, MD;  Location: [facility];  Service: Cardiovascular;  Laterality: N/A;    Family History  Problem Relation Age of Onset   Heart disease Mother    Alcohol abuse Father    Alcohol abuse Sister    Diabetes Sister    Hypertension Sister    Cancer Sister    CAD Sister        One sister with stents   Alcohol abuse Brother    Diabetes Brother    Hypertension Brother      Social History   Socioeconomic History   Marital status: Divorced    Spouse name: Not on file   Number of children: Not on file   Years of education: Not on file   Highest education level: Not on file  Occupational History   Not on file  Tobacco Use   Smoking status: Former   Smokeless tobacco: Never  Vaping Use   Vaping Use: Never used  Substance and Sexual Activity   Alcohol use: No    Alcohol/week: 0.0 standard drinks of alcohol   Drug use: No   Sexual activity: Not on file  Other Topics Concern   Not on file  Social History Narrative   Not on file   Social Determinants of Health   Financial Resource Strain: Low Risk  (02/07/2021)   Overall Financial Resource Strain (CARDIA)    Difficulty of Paying Living Expenses: Not hard at all  Food Insecurity: No Food Insecurity (02/07/2021)   Hunger Vital Sign    Worried About Running Out of Food in the Last Year: Never true    Ran Out of Food in the Last Year: Never true  Transportation Needs:  No Transportation Needs (02/07/2021)   PRAPARE - Administrator, Civil Service (Medical): No    Lack of Transportation (Non-Medical): No  Physical Activity: Sufficiently Active (02/07/2021)   Exercise Vital Sign    Days of Exercise per Week: 5 days    Minutes of Exercise per Session: 30 min  Stress: No Stress Concern Present (02/07/2021)   [employer] of Occupational Health - Occupational Stress Questionnaire    Feeling of Stress : Not at all  Social Connections: Socially Isolated (02/07/2021)   Social Connection and Isolation Panel [NHANES]    Frequency of Communication with Friends and Family: More than three times a week    Frequency of Social Gatherings with Friends and Family: More than three times a week    Attends Religious Services: Never    Database administrator or Organizations: No    Attends Banker Meetings: Never    Marital Status: Never married  Intimate Partner Violence: Not At  Risk (02/07/2021)   Humiliation, Afraid, Rape, and Kick questionnaire    Fear of Current or Ex-Partner: No    Emotionally Abused: No    Physically Abused: No    Sexually Abused: No    Outpatient Medications Prior to Visit  Medication Sig Dispense Refill   acetaminophen (TYLENOL) 500 MG tablet Take 500-1,000 mg by mouth every 6 (six) hours as needed for mild pain.     atorvastatin (LIPITOR) 20 MG tablet Take 1 tablet (20 mg total) by mouth daily. 90 tablet 1   cholecalciferol (VITAMIN D) 1000 UNITS tablet Take 5,000 Units by mouth daily.      flecainide (TAMBOCOR) 150 MG tablet TAKE 1/2 TABLET(75 MG) BY MOUTH TWICE DAILY 90 tablet 3   furosemide (LASIX) 20 MG tablet TAKE 1 TABLET(20 MG) BY MOUTH TWICE DAILY 180 tablet 0   Garlic 1000 MG CAPS Take 1,000 mg by mouth daily.     hydrALAZINE (APRESOLINE) 100 MG tablet TAKE 1 TABLET(100 MG) BY MOUTH THREE TIMES DAILY 270 tablet 2   HYDROcodone bit-homatropine (HYCODAN) 5-1.5 MG/5ML syrup Take 5 mLs by mouth every 8 (eight) hours as needed for cough. 120 mL 0   metoprolol succinate (TOPROL XL) 25 MG 24 hr tablet Take 1 tablet (25 mg total) by mouth daily. 90 tablet 3   Omega-3 Fatty Acids (FISH OIL) 1000 MG CAPS Take 1 capsule by mouth daily.     pantoprazole (PROTONIX) 40 MG tablet TAKE 1 TABLET(40 MG) BY MOUTH DAILY 90 tablet 2   potassium chloride SA (KLOR-CON M) 20 MEQ tablet TAKE 1 TABLET(20 MEQ) BY MOUTH DAILY 90 tablet 2   vitamin E 400 UNIT capsule Take 1,000 Units by mouth daily.      amoxicillin-clavulanate (AUGMENTIN) 875-125 MG tablet Take 1 tablet by mouth 2 (two) times daily. 20 tablet 0   No facility-administered medications prior to visit.    Allergies  Allergen Reactions   Lisinopril Anaphylaxis    cough   Trazodone And Nefazodone     hallucinations    Review of Systems  Eyes:  Positive for pain.  Musculoskeletal:  Positive for neck pain.       Objective:    Physical Exam Constitutional:      General: She is not  in acute distress.    Appearance: She is not ill-appearing.  HENT:     Head:     Jaw: No tenderness or pain on movement.     Comments: Non tender over temporal artery  and no fullness or bounding observed.     Right Ear: Tympanic membrane and ear canal normal. No mastoid tenderness.     Left Ear: Tympanic membrane and ear canal normal. No mastoid tenderness.     Nose: Rhinorrhea present.     Left Sinus: Maxillary sinus tenderness present.     Mouth/Throat:     Lips: Pink.     Mouth: Mucous membranes are moist.     Tongue: Tongue does not deviate from midline.     Pharynx: Oropharynx is clear. No uvula swelling.     Tonsils: No tonsillar exudate.  Eyes:     General: Lids are normal. Vision grossly intact. No visual field deficit.    Extraocular Movements: Extraocular movements intact.     Conjunctiva/sclera: Conjunctivae normal.     Pupils: Pupils are equal, round, and reactive to light.  Neck:     Vascular: No JVD.  Cardiovascular:     Rate and Rhythm: Normal rate and regular rhythm.     Pulses: Normal pulses.  Pulmonary:     Effort: Pulmonary effort is normal.     Breath sounds: Normal breath sounds.  Musculoskeletal:     Cervical back: Full passive range of motion without pain and neck supple.  Lymphadenopathy:     Cervical: No cervical adenopathy.  Skin:    General: Skin is warm and dry.     Findings: No rash.  Neurological:     General: No focal deficit present.     Mental Status: She is alert and oriented to person, place, and time.     Cranial Nerves: Cranial nerves 2-12 are intact. No facial asymmetry.     Sensory: Sensation is intact.     Motor: Motor function is intact.     BP 134/70 (BP Location: Left Arm, Patient Position: Sitting, Cuff Size: Large)   Pulse (!) 58   Temp 97.6 F (36.4 C) (Temporal)   Ht 5\' 7"  (1.702 m)   Wt 201 lb (91.2 kg)   SpO2 98%   BMI 31.48 kg/m  Wt Readings from Last 3 Encounters:  01/02/22 201 lb (91.2 kg)  01/02/22 210 lb  (95.3 kg)  12/07/21 199 lb (90.3 kg)       Assessment & Plan:   Problem List Items Addressed This Visit       Respiratory   Retention cyst of paranasal sinus   Relevant Orders   Ambulatory referral to ENT     Musculoskeletal and Integument   Empty sella [facility])   Relevant Orders   Ambulatory referral to Neurology     Other   Abnormal head CT   Relevant Orders   Ambulatory referral to Neurology   Left-sided headache - Primary   Relevant Orders   Sedimentation rate (Completed)   Ambulatory referral to ENT   Ambulatory referral to Neurology   Reviewed ED note and results. No red flag symptoms. Sed rate ordered to rule out temporal arteritis. Exam speaks against this.  Toradol 30 mg IM injection given.  May take Tylenol at home.  Referral to neurology for empty sella on CT head and possible intracranial hypertension.  Referral to ENT for sinus disease with retention cysts on left.  Strict precautions to be seen if she develops any new or worsening symptoms.  Her family member is with her today and is aware to the recommendations.   I have discontinued [facility]'s amoxicillin-clavulanate. I am also having her maintain her vitamin E, cholecalciferol, Garlic, acetaminophen,  Fish Oil, metoprolol succinate, potassium chloride SA, pantoprazole, flecainide, atorvastatin, hydrALAZINE, HYDROcodone bit-homatropine, and furosemide. We administered ketorolac.  Meds ordered this encounter  Medications   ketorolac (TORADOL) 30 MG/ML injection 30 mg

## 2022-01-02 NOTE — ED Notes (Signed)
Family member and pt have decided to leave and see PCP.

## 2022-01-07 ENCOUNTER — Telehealth: Payer: Self-pay | Admitting: Psychiatry

## 2022-01-07 ENCOUNTER — Encounter: Payer: Self-pay | Admitting: Psychiatry

## 2022-01-07 ENCOUNTER — Ambulatory Visit (INDEPENDENT_AMBULATORY_CARE_PROVIDER_SITE_OTHER): Payer: Medicare Other | Admitting: Psychiatry

## 2022-01-07 VITALS — BP 137/78 | HR 62 | Ht 68.0 in | Wt 202.0 lb

## 2022-01-07 DIAGNOSIS — M5412 Radiculopathy, cervical region: Secondary | ICD-10-CM | POA: Diagnosis not present

## 2022-01-07 DIAGNOSIS — M5481 Occipital neuralgia: Secondary | ICD-10-CM | POA: Diagnosis not present

## 2022-01-07 MED ORDER — GABAPENTIN 100 MG PO CAPS: 200.0000 mg | ORAL_CAPSULE | Freq: Every day | ORAL | 5 refills | Status: DC

## 2022-01-07 NOTE — Telephone Encounter (Signed)
[employer] medicare/ medicaid NPR sent to [facility] [phone] 

## 2022-01-07 NOTE — Progress Notes (Signed)
Referring:  Girtha Rm, NP-C [address] [place]  [address]  PCP: Binnie Rail, MD  Neurology was asked to evaluate Susan Brady, a 76 year ol d female for a chief complaint of headaches.  Our recommendations of care will be communicated by shared medical record.    CC:  headaches  History provided from self, daughter  HPI:  Medical co-morbidities: HLD, HTN, glaucoma  The patient presents for evaluation of daily headaches which began 2 weeks ago. Headache is described as sharp pain in her left eye which radiates to the back of the head and to her left ear. Headaches are triggered by lying down and are most common at night. They last for seconds at a time and occur 2-3 times per day. She denies photophobia, phonophobia, or nausea. Will sometimes have left sided rhinorrhea with it. No ipsilateral conjunctival injection or lacrimation. Also will have a tingling or crawling sensation on her scalp. Sometimes hears a heartbeat in her left ear. Takes Tylenol as needed for her headaches, which does help.  She also reports neck pain, which will sometimes radiate down her left arm.  She presented to the ED 01/02/22 for these symptoms, where [facility] showed a mildly expanded empty sella and retention cysts in the left maxillary and sphenoid sinuses. ESR was normal.  Of note, she had an MRI done in 2021 which also demonstrated a partially empty sella. She also had an eye exam 2-3 months ago which was unremarkable. She denies vision changes.  Headache History: Onset: 1 week ago Triggers: lying down Aura: none Location: left retro-orbital, occiput Quality/Description: sharp Associated Symptoms:  Photophobia: no  Phonophobia: no  Nausea: no Vomiting: no Other symptoms:  ipsilateral rhinorrhea, heartbeat in left ear Worse with activity?: yes Duration of headaches: minutes  Headache days per month: 14 Headache free days per month: 16  Current  Treatment: Abortive Tylenol  Preventative none  Prior Therapies                                 Tylenol - helps Metoprolol Lisinopirl - cough   LABS: CBC    Component Value Date/Time   WBC 5.7 01/02/2022 0404   RBC 4.15 01/02/2022 0404   HGB 11.8 (L) 01/02/2022 0404   HCT 37.8 01/02/2022 0404   PLT 274 01/02/2022 0404   MCV 91.1 01/02/2022 0404   MCH 28.4 01/02/2022 0404   MCHC 31.2 01/02/2022 0404   RDW 13.5 01/02/2022 0404   LYMPHSABS 2.1 08/28/2020 1018   MONOABS 0.7 08/28/2020 1018   EOSABS 0.3 08/28/2020 1018   BASOSABS 0.0 08/28/2020 1018      Latest Ref Rng & Units 01/02/2022    4:04 AM 02/28/2021    9:43 AM 12/12/2020   12:53 PM  CMP  Glucose 70 - 99 mg/dL 114  91  83   BUN 8 - 23 mg/dL _0 Creatinine 0.44 - 1.00 mg/dL 0.82  0.71  0.74   Sodium 135 - 145 mmol/L 142  140  138   Potassium 3.5 - 5.1 mmol/L 3.8  3.9  4.1   Chloride 98 - 111 mmol/L 107  104  104   CO2 22 - 32 mmol/L _1 Calcium 8.9 - 10.3 mg/dL 9.1  9.2  8.9   Total Protein 6.0 - 8.3 g/dL  6.8    Total  Bilirubin 0.2 - 1.2 mg/dL  0.6    Alkaline Phos 39 - 117 U/L  50    AST 0 - 37 U/L  17    ALT 0 - 35 U/L  11     ESR 17  IMAGING:  [facility] 01/02/22: 1. No acute intracranial CT findings. 2. Mild atrophy and small-vessel disease, new from 2016. 3. Mildly expanded empty sella. Nonspecific finding but can be seen with idiopathic intracranial hypertension. 4. Sinus disease.  Imaging independently reviewed on January 07, 2022   Current Outpatient Medications on File Prior to Visit  Medication Sig Dispense Refill   acetaminophen (TYLENOL) 500 MG tablet Take 500-1,000 mg by mouth every 6 (six) hours as needed for mild pain.     atorvastatin (LIPITOR) 20 MG tablet Take 1 tablet (20 mg total) by mouth daily. 90 tablet 1   cholecalciferol (VITAMIN D) 1000 UNITS tablet Take 5,000 Units by mouth daily.      flecainide (TAMBOCOR) 150 MG tablet TAKE 1/2 TABLET(75 MG) BY MOUTH TWICE  DAILY 90 tablet 3   furosemide (LASIX) 20 MG tablet TAKE 1 TABLET(20 MG) BY MOUTH TWICE DAILY 951 tablet 0   Garlic 8841 MG CAPS Take 1,000 mg by mouth daily.     hydrALAZINE (APRESOLINE) 100 MG tablet TAKE 1 TABLET(100 MG) BY MOUTH THREE TIMES DAILY 270 tablet 2   HYDROcodone bit-homatropine (HYCODAN) 5-1.5 MG/5ML syrup Take 5 mLs by mouth every 8 (eight) hours as needed for cough. 120 mL 0   metoprolol succinate (TOPROL XL) 25 MG 24 hr tablet Take 1 tablet (25 mg total) by mouth daily. 90 tablet 3   Omega-3 Fatty Acids (FISH OIL) 1000 MG CAPS Take 1 capsule by mouth daily.     pantoprazole (PROTONIX) 40 MG tablet TAKE 1 TABLET(40 MG) BY MOUTH DAILY 90 tablet 2   potassium chloride SA (KLOR-CON M) 20 MEQ tablet TAKE 1 TABLET(20 MEQ) BY MOUTH DAILY 90 tablet 2   vitamin E 400 UNIT capsule Take 1,000 Units by mouth daily.      No current facility-administered medications on file prior to visit.     Allergies: Allergies  Allergen Reactions   Lisinopril Anaphylaxis    cough   Trazodone And Nefazodone     hallucinations    Family History: Migraine or other headaches in the family:  none Aneurysms in a first degree relative:  none Brain tumors in the family:  none Other neurological illness in the family:   none  Past Medical History: Past Medical History:  Diagnosis Date   Anemia    CHF (congestive heart failure) ([place])    Diabetes mellitus without complication ([place])    Gastric ulcer    Glaucoma    Hiatal hernia    Hyperlipidemia    Hypertension     Past Surgical History Past Surgical History:  Procedure Laterality Date   LEFT HEART CATH AND CORONARY ANGIOGRAPHY N/A 07/25/2016   Procedure: Left Heart Cath and Coronary Angiography;  Surgeon: Burnell Blanks, MD;  Location: [facility];  Service: Cardiovascular;  Laterality: N/A;    Social History: Social History   Tobacco Use   Smoking status: Former   Smokeless tobacco: Never  Scientific laboratory technician Use:  Never used  Substance Use Topics   Alcohol use: No    Alcohol/week: 0.0 standard drinks of alcohol   Drug use: No     ROS: Negative for fevers, chills. Positive for headaches. All other systems reviewed  and negative unless stated otherwise in HPI.   Physical Exam:   Vital Signs: BP 137/78   Pulse 62   Ht _0  (1.727 m)   Wt 202 lb (91.6 kg)   BMI 30.71 kg/m  GENERAL: well appearing,in no acute distress,alert SKIN:  Color, texture, turgor normal. No rashes or lesions HEAD:  Normocephalic/atraumatic. CV:  RRR RESP: Normal respiratory effort MSK: +tenderness to palpation over R>L occiput, neck, and shoulders. Limited cervical range of motion turning head left and right  NEUROLOGICAL: Mental Status: Alert, oriented to person, place and time,Follows commands Cranial Nerves: PERRL, no papilledema visualized, visual fields intact to confrontation, extraocular movements intact, facial sensation intact, no facial droop or ptosis, hearing grossly intact, no dysarthria Motor: muscle strength 5/5 both upper and lower extremities,no drift, normal tone Reflexes: 2+ throughout Sensation: decreased sensation to light touch over LUE, otherwise sensation to light touch intact throughout Coordination: Finger-to- nose-finger intact bilaterally Gait: normal-based   IMPRESSION: 76 year old female with a history of HTN, HLD, glaucoma who presents for evaluation of left sided headaches. CTH with left-sided sinus disease and partially empty sella. Suspect empty sella is an incidental finding as it was present on prior MRI in 2021 and she has had a recent normal eye exam. Will order MRI C-spine as she reports LUE radicular pain and has decreased sensation in the left arm. If this is normal, suspect symptoms are secondary to occipital neuralgia. Will start gabapentin for headache prevention. Discussed neck PT, but patient does not believe she could fit this in her schedule at this time. Sinus disease  seen on [facility] may also be contributing to her headaches, and referral to ENT is pending.  PLAN: -MRI C-spine -Start gabapentin 200 mg QHS, uptitrate as needed -Next steps: consider neck PT, lyrica, PRN muscle relaxer   I spent a total of 37 minutes chart reviewing and counseling the patient. Headache education was done. Discussed treatment options including preventive and acute medications, and physical therapy. Discussed medication side effects, adverse reactions and drug interactions. Written educational materials and patient instructions outlining all of the above were given.  Follow-up: 4 months   Genia Harold, MD 01/07/2022   10:46 AM

## 2022-01-09 ENCOUNTER — Ambulatory Visit
Admission: RE | Admit: 2022-01-09 | Discharge: 2022-01-09 | Disposition: A | Discharge status: Home / Self Care | Payer: Medicare Other | Source: Ambulatory Visit | Attending: Psychiatry | Admitting: Psychiatry

## 2022-01-09 DIAGNOSIS — M4802 Spinal stenosis, cervical region: Secondary | ICD-10-CM | POA: Diagnosis not present

## 2022-01-09 DIAGNOSIS — M5412 Radiculopathy, cervical region: Secondary | ICD-10-CM

## 2022-01-14 ENCOUNTER — Other Ambulatory Visit: Payer: Self-pay | Admitting: Psychiatry

## 2022-01-14 ENCOUNTER — Telehealth: Payer: Self-pay | Admitting: Psychiatry

## 2022-01-14 ENCOUNTER — Telehealth: Payer: Self-pay

## 2022-01-14 DIAGNOSIS — M4802 Spinal stenosis, cervical region: Secondary | ICD-10-CM

## 2022-01-14 NOTE — Telephone Encounter (Signed)
-----   Message from Ocie Doyne, MD sent at 01/14/2022  1:10 PM EST ----- MRI of the C-spine shows arthritis and disc bulging in the neck which is causing moderate-severe spinal stenosis at C3-4, C4-5, C5-6, and C6-7. She also likely has a pinched nerve at left C7. This can cause left sided headaches as well as pain in the neck which goes down the left arm. I've placed a referral for her to be evaluated by orthopedics. In the meantime she should continue gabapentin as this may help with her headaches

## 2022-01-14 NOTE — Telephone Encounter (Signed)
Contacted pt daughter, went over results with her and MD recommendations. Also advised referral was sent to [facility]. Advised to call office back with questions as she had none at this time and was appreciative.

## 2022-01-14 NOTE — Telephone Encounter (Signed)
Referral  for Orthopedics faxed to [facility].Phone:[phone], Fax:[phone]

## 2022-01-17 NOTE — Telephone Encounter (Signed)
Error

## 2022-01-23 ENCOUNTER — Encounter: Payer: Self-pay | Admitting: Internal Medicine

## 2022-01-23 NOTE — Progress Notes (Signed)
Subjective:    Patient ID: Susan Brady, female    DOB: May 25, 1945, 76 y.o.   MRN: [id number]      HPI Susan Brady is here for  Chief Complaint  Patient presents with   Headache   She is here with her daughter.   Headaches -> unable to sleep.     Saw neurology 11/20 - daily headaches x 2 weeks. Sharp pain in left eye radiating to back of head and her left ear.  Triggered by lying down, mostly at night.  Last seconds and occur 2-3 times/day.  Also neck pain, occ left arm pain.    Ct head 01/02/22: 1. No acute intracranial CT findings. 2. Mild atrophy and small-vessel disease, new from 2016. 3. Mildly expanded empty sella. Nonspecific finding but can be seen with idiopathic intracranial hypertension. 4. Sinus disease.  Started gabapentin 200 mg HS for headache prevention.  Pt deferred neck PT. ? Sinus disease contributing to headaches.   MRI cervical spine 01/09/22:   Moderately severe spinal stenosis at C3-C4 and C4-C5, severe spinal stenosis at C5-C6 and mild to moderate spinal stenosis at C6-C7 due to degenerative changes as detailed above. Multilevel foraminal narrowing as detailed above.  There is some encroachment upon the left C5 and C6 nerve roots at C4-C5 and C5-C6 without definite nerve root compression.  There is moderately severe left foraminal narrowing at C6-C7 with potential for C7 nerve root compression. The spinal cord has normal signal.   [facility].  The gabapentin 200 mg is helping.  She denies grogginess.  She is still not sleeping.    Also taking tylenol.   Medications and allergies reviewed with patient and updated if appropriate.  Current Outpatient Medications on File Prior to Visit  Medication Sig Dispense Refill   acetaminophen (TYLENOL) 500 MG tablet Take 500-1,000 mg by mouth every 6 (six) hours as needed for mild pain.     atorvastatin (LIPITOR) 20 MG tablet Take 1 tablet (20 mg total) by mouth daily. 90 tablet 1    cholecalciferol (VITAMIN D) 1000 UNITS tablet Take 5,000 Units by mouth daily.      flecainide (TAMBOCOR) 150 MG tablet TAKE 1/2 TABLET(75 MG) BY MOUTH TWICE DAILY 90 tablet 3   furosemide (LASIX) 20 MG tablet TAKE 1 TABLET(20 MG) BY MOUTH TWICE DAILY 180 tablet 0   Garlic 1000 MG CAPS Take 1,000 mg by mouth daily.     hydrALAZINE (APRESOLINE) 100 MG tablet TAKE 1 TABLET(100 MG) BY MOUTH THREE TIMES DAILY 270 tablet 2   HYDROcodone bit-homatropine (HYCODAN) 5-1.5 MG/5ML syrup Take 5 mLs by mouth every 8 (eight) hours as needed for cough. 120 mL 0   metoprolol succinate (TOPROL XL) 25 MG 24 hr tablet Take 1 tablet (25 mg total) by mouth daily. 90 tablet 3   Omega-3 Fatty Acids (FISH OIL) 1000 MG CAPS Take 1 capsule by mouth daily.     pantoprazole (PROTONIX) 40 MG tablet TAKE 1 TABLET(40 MG) BY MOUTH DAILY 90 tablet 2   potassium chloride SA (KLOR-CON M) 20 MEQ tablet TAKE 1 TABLET(20 MEQ) BY MOUTH DAILY 90 tablet 2   vitamin E 400 UNIT capsule Take 1,000 Units by mouth daily.      No current facility-administered medications on file prior to visit.    Review of Systems     Objective:   Vitals:   01/25/22 0849 01/25/22 0855  BP: (!) 140/82 136/74  Pulse: 68   Temp: 98 F (  36.7 C)   SpO2: 96%    BP Readings from Last 3 Encounters:  01/25/22 136/74  01/07/22 137/78  01/02/22 134/70   Wt Readings from Last 3 Encounters:  01/25/22 203 lb (92.1 kg)  01/07/22 202 lb (91.6 kg)  01/02/22 201 lb (91.2 kg)   Body mass index is 30.87 kg/m.    Physical Exam Constitutional:      General: She is not in acute distress.    Appearance: She is well-developed. She is not ill-appearing.  HENT:     Head: Normocephalic and atraumatic.  Musculoskeletal:        General: No tenderness (No tenderness posterior neck, posterior head or upper back).  Skin:    General: Skin is warm and dry.  Neurological:     Mental Status: She is alert. Mental status is at baseline.             Assessment & Plan:    See Problem List for Assessment and Plan of chronic medical problems.

## 2022-01-25 ENCOUNTER — Ambulatory Visit (INDEPENDENT_AMBULATORY_CARE_PROVIDER_SITE_OTHER): Payer: Medicare Other | Admitting: Internal Medicine

## 2022-01-25 VITALS — BP 136/74 | HR 68 | Temp 98.0°F | Ht 68.0 in | Wt 203.0 lb

## 2022-01-25 DIAGNOSIS — G4486 Cervicogenic headache: Secondary | ICD-10-CM | POA: Insufficient documentation

## 2022-01-25 MED ORDER — GABAPENTIN 100 MG PO CAPS: 300.0000 mg | ORAL_CAPSULE | Freq: Every day | ORAL | 5 refills | Status: DC

## 2022-01-25 NOTE — Patient Instructions (Addendum)
    Can take up to 3000 mg tylenol a day    Medications changes include :   increase the gabapentin to 300-400 mg at night - can add 100 mg in morning .       Return if symptoms worsen or fail to improve.

## 2022-01-25 NOTE — Assessment & Plan Note (Signed)
Subacute Affecting her sleep Saw neurology-had imaging and to see orthopedics on the 12th for evaluation and further treatment Found to have cysts and sinuses and was referred to ENT Started on gabapentin 200 mg at bedtime which has helped some Taking Tylenol at night Still he having difficulty sleeping Discussed that she can increase her Tylenol intake-can take up to 3000 mg a day Will try increasing gabapentin to 300-400 mg at night-she is not living alone and her daughter is there.  She denies any drowsiness or grogginess with the 200 mg dose Discussed with her daughter that we could also try 100 mg in the morning since she is symptomatic during the day-advised that she increase the dose slowly and call with any concerns or questions Discussed other pain medication, but would like to avoid and her and her daughter agree

## 2022-01-29 DIAGNOSIS — M542 Cervicalgia: Secondary | ICD-10-CM | POA: Diagnosis not present

## 2022-02-12 ENCOUNTER — Ambulatory Visit: Payer: Medicare Other

## 2022-02-25 DIAGNOSIS — R519 Headache, unspecified: Secondary | ICD-10-CM | POA: Diagnosis not present

## 2022-02-25 DIAGNOSIS — J309 Allergic rhinitis, unspecified: Secondary | ICD-10-CM | POA: Diagnosis not present

## 2022-02-25 DIAGNOSIS — J341 Cyst and mucocele of nose and nasal sinus: Secondary | ICD-10-CM | POA: Diagnosis not present

## 2022-02-27 DIAGNOSIS — G4486 Cervicogenic headache: Secondary | ICD-10-CM | POA: Diagnosis not present

## 2022-02-27 DIAGNOSIS — M542 Cervicalgia: Secondary | ICD-10-CM | POA: Diagnosis not present

## 2022-03-07 ENCOUNTER — Ambulatory Visit (INDEPENDENT_AMBULATORY_CARE_PROVIDER_SITE_OTHER): Payer: 59

## 2022-03-07 ENCOUNTER — Telehealth: Payer: Self-pay

## 2022-03-07 VITALS — Ht 68.0 in | Wt 208.0 lb

## 2022-03-07 DIAGNOSIS — Z0000 Encounter for general adult medical examination without abnormal findings: Secondary | ICD-10-CM

## 2022-03-07 NOTE — Progress Notes (Signed)
Virtual Visit via Telephone Note  I connected with  Susan Brady on 03/07/22 at  3:00 PM EST by telephone and verified that I am speaking with the correct person using two identifiers.  Location: Patient: Home with Daughter Provider: [place] Persons participating in the virtual visit: Susan Brady   I discussed the limitations, risks, security and privacy concerns of performing an evaluation and management service by telephone and the availability of in person appointments. The patient expressed understanding and agreed to proceed.  Interactive audio and video telecommunications were attempted between this nurse and patient, however failed, due to patient having technical difficulties OR patient did not have access to video capability.  We continued and completed visit with audio only.  Some vital signs may be absent or patient reported.   Sheral Flow, LPN  Subjective:   Susan Brady is a 77 y.o. female who presents for Medicare Annual (Subsequent) preventive examination.  Review of Systems     Cardiac Risk Factors include: advanced age (>78men, >50 women);dyslipidemia;family history of premature cardiovascular disease;hypertension;obesity (BMI >30kg/m2);sedentary lifestyle     Objective:    Today's Vitals   03/07/22 1516  Weight: 208 lb (94.3 kg)  Height: 5\' 8"  (1.727 m)  PainSc: 0-No pain   Body mass index is 31.63 kg/m.     03/07/2022    3:17 PM 01/02/2022    3:52 AM 02/07/2021    3:43 PM 01/03/2020    1:58 PM 08/01/2019    8:56 PM 12/20/2016    3:36 PM 07/23/2016    7:51 PM  Advanced Directives  Does Patient Have a Medical Advance Directive? No No No Yes No No No  Does patient want to make changes to medical advance directive?    No - Patient declined     Would patient like information on creating a medical advance directive? No - Patient declined No - Patient declined No - Patient declined  No - Patient declined Yes (ED -  Information included in AVS) No - Patient declined    Current Medications (verified) Outpatient Encounter Medications as of 03/07/2022  Medication Sig   acetaminophen (TYLENOL) 500 MG tablet Take 500-1,000 mg by mouth every 6 (six) hours as needed for mild pain.   atorvastatin (LIPITOR) 20 MG tablet Take 1 tablet (20 mg total) by mouth daily.   cholecalciferol (VITAMIN D) 1000 UNITS tablet Take 5,000 Units by mouth daily.    flecainide (TAMBOCOR) 150 MG tablet TAKE 1/2 TABLET(75 MG) BY MOUTH TWICE DAILY   furosemide (LASIX) 20 MG tablet TAKE 1 TABLET(20 MG) BY MOUTH TWICE DAILY   gabapentin (NEURONTIN) 100 MG capsule Take 3-4 capsules (300-400 mg total) by mouth at bedtime.   Garlic 2130 MG CAPS Take 1,000 mg by mouth daily.   hydrALAZINE (APRESOLINE) 100 MG tablet TAKE 1 TABLET(100 MG) BY MOUTH THREE TIMES DAILY   HYDROcodone bit-homatropine (HYCODAN) 5-1.5 MG/5ML syrup Take 5 mLs by mouth every 8 (eight) hours as needed for cough.   metoprolol succinate (TOPROL XL) 25 MG 24 hr tablet Take 1 tablet (25 mg total) by mouth daily.   Omega-3 Fatty Acids (FISH OIL) 1000 MG CAPS Take 1 capsule by mouth daily.   pantoprazole (PROTONIX) 40 MG tablet TAKE 1 TABLET(40 MG) BY MOUTH DAILY   potassium chloride SA (KLOR-CON M) 20 MEQ tablet TAKE 1 TABLET(20 MEQ) BY MOUTH DAILY   vitamin E 400 UNIT capsule Take 1,000 Units by mouth daily.    No facility-administered encounter medications on  file as of 03/07/2022.    Allergies (verified) Lisinopril and Trazodone and nefazodone   History: Past Medical History:  Diagnosis Date   Anemia    CHF (congestive heart failure) (HCC)    Diabetes mellitus without complication (HCC)    Gastric ulcer    Glaucoma    Hiatal hernia    Hyperlipidemia    Hypertension    Past Surgical History:  Procedure Laterality Date   LEFT HEART CATH AND CORONARY ANGIOGRAPHY N/A 07/25/2016   Procedure: Left Heart Cath and Coronary Angiography;  Surgeon: Kathleene Hazel, MD;  Location: [facility];  Service: Cardiovascular;  Laterality: N/A;   Family History  Problem Relation Age of Onset   Heart disease Mother    Alcohol abuse Father    Alcohol abuse Sister    Diabetes Sister    Hypertension Sister    Cancer Sister    CAD Sister        One sister with stents   Alcohol abuse Brother    Diabetes Brother    Hypertension Brother    Social History   Socioeconomic History   Marital status: Divorced    Spouse name: Not on file   Number of children: Not on file   Years of education: Not on file   Highest education level: Not on file  Occupational History   Not on file  Tobacco Use   Smoking status: Former   Smokeless tobacco: Never  Vaping Use   Vaping Use: Never used  Substance and Sexual Activity   Alcohol use: No    Alcohol/week: 0.0 standard drinks of alcohol   Drug use: No   Sexual activity: Not on file  Other Topics Concern   Not on file  Social History Narrative   Not on file   Social Determinants of Health   Financial Resource Strain: Low Risk  (03/07/2022)   Overall Financial Resource Strain (CARDIA)    Difficulty of Paying Living Expenses: Not hard at all  Food Insecurity: No Food Insecurity (03/07/2022)   Hunger Vital Sign    Worried About Running Out of Food in the Last Year: Never true    Ran Out of Food in the Last Year: Never true  Transportation Needs: No Transportation Needs (03/07/2022)   PRAPARE - Administrator, Civil Service (Medical): No    Lack of Transportation (Non-Medical): No  Physical Activity: Inactive (03/07/2022)   Exercise Vital Sign    Days of Exercise per Week: 0 days    Minutes of Exercise per Session: 0 min  Stress: No Stress Concern Present (03/07/2022)   [employer] of Occupational Health - Occupational Stress Questionnaire    Feeling of Stress : Not at all  Social Connections: Moderately Integrated (03/07/2022)   Social Connection and Isolation Panel [NHANES]     Frequency of Communication with Friends and Family: More than three times a week    Frequency of Social Gatherings with Friends and Family: More than three times a week    Attends Religious Services: More than 4 times per year    Active Member of [employer] or Organizations: Yes    Attends Engineer, structural: More than 4 times per year    Marital Status: Divorced  Recent Concern: Social Connections - Socially Isolated (03/07/2022)   Social Connection and Isolation Panel [NHANES]    Frequency of Communication with Friends and Family: More than three times a week    Frequency of Social Gatherings  with Friends and Family: More than three times a week    Attends Religious Services: Never    Active Member of Clubs or Organizations: No    Attends Music therapist: Never    Marital Status: Never married    Tobacco Counseling Counseling given: Not Answered   Clinical Intake:  Pre-visit preparation completed: Yes  Pain : No/denies pain Pain Score: 0-No pain     BMI - recorded: 31.63 Nutritional Status: BMI > 30  Obese Nutritional Risks: None Diabetes: No  How often do you need to have someone help you when you read instructions, pamphlets, or other written materials from your doctor or pharmacy?: 1 - Never What is the last grade level you completed in school?: HSG  Diabetic? No  Interpreter Needed?: No  Information entered by :: Lisette Abu, LPN.   Activities of Daily Living    03/07/2022    3:25 PM  In your present state of health, do you have any difficulty performing the following activities:  Hearing? 0  Vision? 0  Difficulty concentrating or making decisions? 0  Walking or climbing stairs? 1  Dressing or bathing? 0  Doing errands, shopping? 1  Preparing Food and eating ? N  Using the Toilet? N  In the past six months, have you accidently leaked urine? Y  Do you have problems with loss of bowel control? N  Managing your Medications? N   Managing your Finances? N  Housekeeping or managing your Housekeeping? Y    Patient Care Team: Binnie Rail, MD as PCP - General (Internal Medicine) [facility] as Consulting Physician (Optometry)  Indicate any recent Medical Services you may have received from other than [facility] providers in the past year (date may be approximate).     Assessment:   This is a routine wellness examination for Susan Brady.  Hearing/Vision screen Hearing Screening - Comments:: Denies hearing difficulties   Vision Screening - Comments:: Wears rx glasses - up to date with routine eye exams with  [facility] ([facility] to [facility]).  Dietary issues and exercise activities discussed: Current Exercise Habits: The patient does not participate in regular exercise at present, Exercise limited by: cardiac condition(s)   Goals Addressed             This Visit's Progress    Try to maintain, stay independent and socially involved.        Depression Screen    03/07/2022    3:20 PM 01/25/2022    8:51 AM 12/07/2021    2:12 PM 02/07/2021    3:56 PM 01/03/2020    1:56 PM 03/03/2019    1:18 PM 12/20/2016    3:36 PM  PHQ 2/9 Scores  PHQ - 2 Score 0 0 0 0 1 0 1  PHQ- 9 Score 0 0 0    3    Fall Risk    03/07/2022    3:19 PM 01/25/2022    8:51 AM 12/07/2021    2:12 PM 02/07/2021    3:45 PM 01/03/2020    1:58 PM  [place] in the past year? 0 0 0 0 0  Number falls in past yr: 0 0 0 0 0  Injury with Fall? 0 0 0 0 0  Risk for fall due to : No Fall Risks No Fall Risks No Fall Risks No Fall Risks No Fall Risks  Follow up Falls prevention discussed Falls evaluation completed Falls evaluation completed Falls prevention discussed Falls  evaluation completed;Education provided    FALL RISK PREVENTION PERTAINING TO THE HOME:  Any stairs in or around the home? No  If so, are there any without handrails? No  Home free of loose throw rugs in walkways, pet beds, electrical cords, etc?  Yes  Adequate lighting in your home to reduce risk of falls? Yes   ASSISTIVE DEVICES UTILIZED TO PREVENT FALLS:  Life alert? No  Use of a cane, walker or w/c? Yes  Grab bars in the bathroom? Yes  Shower chair or bench in shower? Yes  Elevated toilet seat or a handicapped toilet? Yes   TIMED UP AND GO: Phone Visit Only  Was the test performed? No .   Cognitive Function:    12/20/2016    3:38 PM  MMSE - Mini Mental State Exam  Not completed: Refused        03/07/2022    3:25 PM 01/03/2020    2:00 PM  6CIT Screen  What Year? 0 points 0 points  What month? 0 points 0 points  What time? 0 points 0 points  Count back from 20 0 points 0 points  Months in reverse 0 points 0 points  Repeat phrase 0 points 0 points  Total Score 0 points 0 points    Immunizations Immunization History  Administered Date(s) Administered   Fluad Quad(high Dose 65+) 12/04/2018, 11/05/2019, 12/06/2020   Influenza, High Dose Seasonal PF 11/14/2015, 12/20/2016, 12/05/2017   Influenza,inj,Quad PF,6+ Mos 11/22/2014   Moderna Sars-Covid-2 Vaccination 03/30/2019, 04/28/2019   Pneumococcal Conjugate-13 03/24/2015   Pneumococcal Polysaccharide-23 12/20/2016    TDAP status: Due, Education has been provided regarding the importance of this vaccine. Advised may receive this vaccine at local pharmacy or Health Dept. Aware to provide a copy of the vaccination record if obtained from local pharmacy or Health Dept. Verbalized acceptance and understanding.  Flu Vaccine status: Due, Education has been provided regarding the importance of this vaccine. Advised may receive this vaccine at local pharmacy or Health Dept. Aware to provide a copy of the vaccination record if obtained from local pharmacy or Health Dept. Verbalized acceptance and understanding.  Pneumococcal vaccine status: Up to date  Covid-19 vaccine status: Completed vaccines  Qualifies for Shingles Vaccine? Yes   Zostavax completed No   Shingrix  Completed?: No.    Education has been provided regarding the importance of this vaccine. Patient has been advised to call insurance company to determine out of pocket expense if they have not yet received this vaccine. Advised may also receive vaccine at local pharmacy or Health Dept. Verbalized acceptance and understanding.  Screening Tests Health Maintenance  Topic Date Due   OPHTHALMOLOGY EXAM  Never done   DTaP/Tdap/Td (1 - Tdap) Never done   Zoster Vaccines- Shingrix (1 of 2) Never done   COVID-19 Vaccine (3 - Moderna risk series) 05/26/2019   Diabetic kidney evaluation - Urine ACR  08/28/2021   HEMOGLOBIN A1C  08/28/2021   INFLUENZA VACCINE  09/18/2021   FOOT EXAM  01/16/2022   Diabetic kidney evaluation - eGFR measurement  01/03/2023   Medicare Annual Wellness (AWV)  03/08/2023   DEXA SCAN  07/13/2024   Pneumonia Vaccine 59+ Years old  Completed   Hepatitis C Screening  Completed   HPV VACCINES  Aged Out   COLONOSCOPY (Pts 45-94yrs Insurance coverage will need to be confirmed)  Discontinued    Health Maintenance  Health Maintenance Due  Topic Date Due   OPHTHALMOLOGY EXAM  Never done  DTaP/Tdap/Td (1 - Tdap) Never done   Zoster Vaccines- Shingrix (1 of 2) Never done   COVID-19 Vaccine (3 - Moderna risk series) 05/26/2019   Diabetic kidney evaluation - Urine ACR  08/28/2021   HEMOGLOBIN A1C  08/28/2021   INFLUENZA VACCINE  09/18/2021   FOOT EXAM  01/16/2022    Colorectal cancer screening: No longer required.   Mammogram status: Completed 05/01/2021. Repeat every year  Bone Density status: Completed 07/14/2019. Results reflect: Bone density results: NORMAL. Repeat every 5 years.  Lung Cancer Screening: (Low Dose CT Chest recommended if Age 80-80 years, 30 pack-year currently smoking OR have quit w/in 15years.) does not qualify.   Lung Cancer Screening Referral: no  Additional Screening:  Hepatitis C Screening: does qualify; Completed 04/03/2015  Vision Screening:  Recommended annual ophthalmology exams for early detection of glaucoma and other disorders of the eye. Is the patient up to date with their annual eye exam?  Yes  Who is the provider or what is the name of the office in which the patient attends annual eye exams? [facility] If pt is not established with a provider, would they like to be referred to a provider to establish care? No .   Dental Screening: Recommended annual dental exams for proper oral hygiene  Community Resource Referral / Chronic Care Management: CRR required this visit?  No   CCM required this visit?  No      Plan:     I have personally reviewed and noted the following in the patient's chart:   Medical and social history Use of alcohol, tobacco or illicit drugs  Current medications and supplements including opioid prescriptions. Patient is not currently taking opioid prescriptions. Functional ability and status Nutritional status Physical activity Advanced directives List of other physicians Hospitalizations, surgeries, and ER visits in previous 12 months Vitals Screenings to include cognitive, depression, and falls Referrals and appointments  In addition, I have reviewed and discussed with patient certain preventive protocols, quality metrics, and best practice recommendations. A written personalized care plan for preventive services as well as general preventive health recommendations were provided to patient.     Mickeal Needy, LPN   [id number]/54/5625   Nurse Notes: N/A

## 2022-03-07 NOTE — Telephone Encounter (Addendum)
Called patient lvm to return call, to complete AWV at [phone].  If no return call within 15 minutes, patient may reschedule for the next available appointment with [facility] or CMA. Susan Brady  AWV-S was completed by phone with patient and daughter.

## 2022-03-07 NOTE — Patient Instructions (Signed)
Susan Brady , Thank you for taking time to come for your Medicare Wellness Visit. I appreciate your ongoing commitment to your health goals. Please review the following plan we discussed and let me know if I can assist you in the future.   These are the goals we discussed:  Goals      Try to maintain, stay independent and socially involved.        This is a list of the screening recommended for you and due dates:  Health Maintenance  Topic Date Due   Eye exam for diabetics  Never done   DTaP/Tdap/Td vaccine (1 - Tdap) Never done   Zoster (Shingles) Vaccine (1 of 2) Never done   COVID-19 Vaccine (3 - Moderna risk series) 05/26/2019   Yearly kidney health urinalysis for diabetes  08/28/2021   Hemoglobin A1C  08/28/2021   Flu Shot  09/18/2021   Complete foot exam   01/16/2022   Yearly kidney function blood test for diabetes  01/03/2023   Medicare Annual Wellness Visit  03/08/2023   DEXA scan (bone density measurement)  07/13/2024   Pneumonia Vaccine  Completed   Hepatitis C Screening: USPSTF Recommendation to screen - Ages 53-79 yo.  Completed   HPV Vaccine  Aged Out   Colon Cancer Screening  Discontinued    Advanced directives: No  Conditions/risks identified: Yes  Next appointment: Follow up in one year for your annual wellness visit.   Preventive Care 3 Years and Older, Female Preventive care refers to lifestyle choices and visits with your health care provider that can promote health and wellness. What does preventive care include? A yearly physical exam. This is also called an annual well check. Dental exams once or twice a year. Routine eye exams. Ask your health care provider how often you should have your eyes checked. Personal lifestyle choices, including: Daily care of your teeth and gums. Regular physical activity. Eating a healthy diet. Avoiding tobacco and drug use. Limiting alcohol use. Practicing safe sex. Taking low-dose aspirin every day. Taking  vitamin and mineral supplements as recommended by your health care provider. What happens during an annual well check? The services and screenings done by your health care provider during your annual well check will depend on your age, overall health, lifestyle risk factors, and family history of disease. Counseling  Your health care provider may ask you questions about your: Alcohol use. Tobacco use. Drug use. Emotional well-being. Home and relationship well-being. Sexual activity. Eating habits. History of falls. Memory and ability to understand (cognition). Work and work Statistician. Reproductive health. Screening  You may have the following tests or measurements: Height, weight, and BMI. Blood pressure. Lipid and cholesterol levels. These may be checked every 5 years, or more frequently if you are over 58 years old. Skin check. Lung cancer screening. You may have this screening every year starting at age 65 if you have a 30-pack-year history of smoking and currently smoke or have quit within the past 15 years. Fecal occult blood test (FOBT) of the stool. You may have this test every year starting at age 70. Flexible sigmoidoscopy or colonoscopy. You may have a sigmoidoscopy every 5 years or a colonoscopy every 10 years starting at age 49. Hepatitis C blood test. Hepatitis B blood test. Sexually transmitted disease (STD) testing. Diabetes screening. This is done by checking your blood sugar (glucose) after you have not eaten for a while (fasting). You may have this done every 1-3 years. Bone density scan. This  is done to screen for osteoporosis. You may have this done starting at age 2. Mammogram. This may be done every 1-2 years. Talk to your health care provider about how often you should have regular mammograms. Talk with your health care provider about your test results, treatment options, and if necessary, the need for more tests. Vaccines  Your health care provider may  recommend certain vaccines, such as: Influenza vaccine. This is recommended every year. Tetanus, diphtheria, and acellular pertussis (Tdap, Td) vaccine. You may need a Td booster every 10 years. Zoster vaccine. You may need this after age 67. Pneumococcal 13-valent conjugate (PCV13) vaccine. One dose is recommended after age 21. Pneumococcal polysaccharide (PPSV23) vaccine. One dose is recommended after age 57. Talk to your health care provider about which screenings and vaccines you need and how often you need them. This information is not intended to replace advice given to you by your health care provider. Make sure you discuss any questions you have with your health care provider. Document Released: 03/03/2015 Document Revised: 10/25/2015 Document Reviewed: 12/06/2014 Elsevier Interactive Patient Education  2017 [place] Prevention in the Home Falls can cause injuries. They can happen to people of all ages. There are many things you can do to make your home safe and to help prevent falls. What can I do on the outside of my home? Regularly fix the edges of walkways and driveways and fix any cracks. Remove anything that might make you trip as you walk through a door, such as a raised step or threshold. Trim any bushes or trees on the path to your home. Use bright outdoor lighting. Clear any walking paths of anything that might make someone trip, such as rocks or tools. Regularly check to see if handrails are loose or broken. Make sure that both sides of any steps have handrails. Any raised decks and porches should have guardrails on the edges. Have any leaves, snow, or ice cleared regularly. Use sand or salt on walking paths during winter. Clean up any spills in your garage right away. This includes oil or grease spills. What can I do in the bathroom? Use night lights. Install grab bars by the toilet and in the tub and shower. Do not use towel bars as grab bars. Use non-skid  mats or decals in the tub or shower. If you need to sit down in the shower, use a plastic, non-slip stool. Keep the floor dry. Clean up any water that spills on the floor as soon as it happens. Remove soap buildup in the tub or shower regularly. Attach bath mats securely with double-sided non-slip rug tape. Do not have throw rugs and other things on the floor that can make you trip. What can I do in the bedroom? Use night lights. Make sure that you have a light by your bed that is easy to reach. Do not use any sheets or blankets that are too big for your bed. They should not hang down onto the floor. Have a firm chair that has side arms. You can use this for support while you get dressed. Do not have throw rugs and other things on the floor that can make you trip. What can I do in the kitchen? Clean up any spills right away. Avoid walking on wet floors. Keep items that you use a lot in easy-to-reach places. If you need to reach something above you, use a strong step stool that has a grab bar. Keep electrical cords out  of the way. Do not use floor polish or wax that makes floors slippery. If you must use wax, use non-skid floor wax. Do not have throw rugs and other things on the floor that can make you trip. What can I do with my stairs? Do not leave any items on the stairs. Make sure that there are handrails on both sides of the stairs and use them. Fix handrails that are broken or loose. Make sure that handrails are as long as the stairways. Check any carpeting to make sure that it is firmly attached to the stairs. Fix any carpet that is loose or worn. Avoid having throw rugs at the top or bottom of the stairs. If you do have throw rugs, attach them to the floor with carpet tape. Make sure that you have a light switch at the top of the stairs and the bottom of the stairs. If you do not have them, ask someone to add them for you. What else can I do to help prevent falls? Wear shoes  that: Do not have high heels. Have rubber bottoms. Are comfortable and fit you well. Are closed at the toe. Do not wear sandals. If you use a stepladder: Make sure that it is fully opened. Do not climb a closed stepladder. Make sure that both sides of the stepladder are locked into place. Ask someone to hold it for you, if possible. Clearly mark and make sure that you can see: Any grab bars or handrails. First and last steps. Where the edge of each step is. Use tools that help you move around (mobility aids) if they are needed. These include: Canes. Walkers. Scooters. Crutches. Turn on the lights when you go into a dark area. Replace any light bulbs as soon as they burn out. Set up your furniture so you have a clear path. Avoid moving your furniture around. If any of your floors are uneven, fix them. If there are any pets around you, be aware of where they are. Review your medicines with your doctor. Some medicines can make you feel dizzy. This can increase your chance of falling. Ask your doctor what other things that you can do to help prevent falls. This information is not intended to replace advice given to you by your health care provider. Make sure you discuss any questions you have with your health care provider. Document Released: 12/01/2008 Document Revised: 07/13/2015 Document Reviewed: 03/11/2014 Elsevier Interactive Patient Education  2017 [employer].

## 2022-03-09 NOTE — Progress Notes (Signed)
Medical screening examination/treatment/procedure(s) were performed by non-physician practitioner and as supervising physician I was immediately available for consultation/collaboration.  I agree with above. Masin Shatto J Almyra Birman, MD  

## 2022-03-24 ENCOUNTER — Other Ambulatory Visit: Payer: Self-pay | Admitting: Internal Medicine

## 2022-03-25 ENCOUNTER — Other Ambulatory Visit: Payer: Self-pay

## 2022-04-11 ENCOUNTER — Other Ambulatory Visit: Payer: Self-pay | Admitting: Internal Medicine

## 2022-04-20 NOTE — Patient Instructions (Addendum)
      Blood work was ordered.   The lab is on the first floor.    Medications changes include :   none     Return in about 6 months (around 10/23/2022) for Physical Exam.

## 2022-04-20 NOTE — Progress Notes (Unsigned)
Subjective:    Patient ID: Susan Brady, female    DOB: 1945-07-23, 77 y.o.   MRN: [id number]     HPI Susan Brady is here for follow up of her chronic medical problems, including HRpEF, htn, hld, DM, gerd, OA, back pain with radiculopathy, cervicogenic HAs  Has URI - at tail end of it - cough at night - getting better.     Medications and allergies reviewed with patient and updated if appropriate.  Current Outpatient Medications on File Prior to Visit  Medication Sig Dispense Refill   acetaminophen (TYLENOL) 500 MG tablet Take 500-1,000 mg by mouth every 6 (six) hours as needed for mild pain.     atorvastatin (LIPITOR) 20 MG tablet Take 1 tablet (20 mg total) by mouth daily. 90 tablet 1   cholecalciferol (VITAMIN D) 1000 UNITS tablet Take 5,000 Units by mouth daily.      flecainide (TAMBOCOR) 150 MG tablet TAKE 1/2 TABLET(75 MG) BY MOUTH TWICE DAILY 90 tablet 3   furosemide (LASIX) 20 MG tablet TAKE 1 TABLET(20 MG) BY MOUTH TWICE DAILY 180 tablet 0   gabapentin (NEURONTIN) 100 MG capsule Take 3-4 capsules (300-400 mg total) by mouth at bedtime. 123456 capsule 5   Garlic 123XX123 MG CAPS Take 1,000 mg by mouth daily.     hydrALAZINE (APRESOLINE) 100 MG tablet TAKE 1 TABLET(100 MG) BY MOUTH THREE TIMES DAILY 270 tablet 2   metoprolol succinate (TOPROL XL) 25 MG 24 hr tablet Take 1 tablet (25 mg total) by mouth daily. 90 tablet 3   Omega-3 Fatty Acids (FISH OIL) 1000 MG CAPS Take 1 capsule by mouth daily.     pantoprazole (PROTONIX) 40 MG tablet TAKE 1 TABLET(40 MG) BY MOUTH DAILY 90 tablet 2   potassium chloride SA (KLOR-CON M) 20 MEQ tablet TAKE 1 TABLET(20 MEQ) BY MOUTH DAILY 90 tablet 2   vitamin E 400 UNIT capsule Take 1,000 Units by mouth daily.      No current facility-administered medications on file prior to visit.     Review of Systems  Constitutional:  Negative for fever.  Respiratory:  Positive for cough (related URI). Negative for shortness of breath and wheezing.    Cardiovascular:  Negative for chest pain, palpitations and leg swelling.  Neurological:  Positive for light-headedness. Negative for numbness (tingling in feet) and headaches.       Objective:   Vitals:   04/22/22 0802  BP: 128/72  Pulse: 60  Temp: 98.4 F (36.9 C)  SpO2: 95%   BP Readings from Last 3 Encounters:  04/22/22 128/72  01/25/22 136/74  01/07/22 137/78   Wt Readings from Last 3 Encounters:  04/22/22 193 lb 3.2 oz (87.6 kg)  03/07/22 208 lb (94.3 kg)  01/25/22 203 lb (92.1 kg)   Body mass index is 29.38 kg/m.    Physical Exam Constitutional:      General: She is not in acute distress.    Appearance: Normal appearance.  HENT:     Head: Normocephalic and atraumatic.  Eyes:     Conjunctiva/sclera: Conjunctivae normal.  Cardiovascular:     Rate and Rhythm: Normal rate and regular rhythm.     Heart sounds: Normal heart sounds.  Pulmonary:     Effort: Pulmonary effort is normal. No respiratory distress.     Breath sounds: Normal breath sounds. No wheezing.  Musculoskeletal:     Cervical back: Neck supple.     Right lower leg: No edema.  Left lower leg: No edema.  Lymphadenopathy:     Cervical: No cervical adenopathy.  Skin:    General: Skin is warm and dry.     Findings: No rash.  Neurological:     Mental Status: She is alert. Mental status is at baseline.  Psychiatric:        Mood and Affect: Mood normal.        Behavior: Behavior normal.        Lab Results  Component Value Date   WBC 5.7 01/02/2022   HGB 11.8 (L) 01/02/2022   HCT 37.8 01/02/2022   PLT 274 01/02/2022   GLUCOSE 114 (H) 01/02/2022   CHOL 146 02/28/2021   TRIG 61.0 02/28/2021   HDL 72.10 02/28/2021   LDLCALC 62 02/28/2021   ALT 11 02/28/2021   AST 17 02/28/2021   NA 142 01/02/2022   K 3.8 01/02/2022   CL 107 01/02/2022   CREATININE 0.82 01/02/2022   BUN 13 01/02/2022   CO2 26 01/02/2022   TSH 0.479 06/02/2019   INR 1.1 09/17/2019   HGBA1C 6.0 02/28/2021    MICROALBUR 2.9 (H) 08/28/2020     Assessment & Plan:    See Problem List for Assessment and Plan of chronic medical problems.

## 2022-04-22 ENCOUNTER — Ambulatory Visit (INDEPENDENT_AMBULATORY_CARE_PROVIDER_SITE_OTHER): Payer: 59 | Admitting: Internal Medicine

## 2022-04-22 ENCOUNTER — Encounter: Payer: Self-pay | Admitting: Internal Medicine

## 2022-04-22 VITALS — BP 128/72 | HR 60 | Temp 98.4°F | Ht 68.0 in | Wt 193.2 lb

## 2022-04-22 DIAGNOSIS — K219 Gastro-esophageal reflux disease without esophagitis: Secondary | ICD-10-CM

## 2022-04-22 DIAGNOSIS — I5032 Chronic diastolic (congestive) heart failure: Secondary | ICD-10-CM | POA: Diagnosis not present

## 2022-04-22 DIAGNOSIS — E7849 Other hyperlipidemia: Secondary | ICD-10-CM | POA: Diagnosis not present

## 2022-04-22 DIAGNOSIS — I10 Essential (primary) hypertension: Secondary | ICD-10-CM | POA: Diagnosis not present

## 2022-04-22 DIAGNOSIS — R202 Paresthesia of skin: Secondary | ICD-10-CM | POA: Diagnosis not present

## 2022-04-22 DIAGNOSIS — G4486 Cervicogenic headache: Secondary | ICD-10-CM | POA: Diagnosis not present

## 2022-04-22 DIAGNOSIS — E119 Type 2 diabetes mellitus without complications: Secondary | ICD-10-CM | POA: Diagnosis not present

## 2022-04-22 LAB — COMPREHENSIVE METABOLIC PANEL
ALT: 9 U/L (ref 0–35)
AST: 18 U/L (ref 0–37)
Albumin: 3.9 g/dL (ref 3.5–5.2)
Alkaline Phosphatase: 43 U/L (ref 39–117)
BUN: 12 mg/dL (ref 6–23)
CO2: 31 mEq/L (ref 19–32)
Calcium: 9.6 mg/dL (ref 8.4–10.5)
Chloride: 104 mEq/L (ref 96–112)
Creatinine, Ser: 0.73 mg/dL (ref 0.40–1.20)
GFR: 79.27 mL/min (ref >60.00)
Glucose, Bld: 92 mg/dL (ref 70–99)
Potassium: 3.9 mEq/L (ref 3.5–5.1)
Sodium: 142 mEq/L (ref 135–145)
Total Bilirubin: 0.5 mg/dL (ref 0.2–1.2)
Total Protein: 6.9 g/dL (ref 6.0–8.3)

## 2022-04-22 LAB — MICROALBUMIN / CREATININE URINE RATIO
Creatinine,U: 165.3 mg/dL
Microalb Creat Ratio: 1.4 mg/g (ref 0.0–30.0)
Microalb, Ur: 2.3 mg/dL — ABNORMAL HIGH (ref 0.0–1.9)

## 2022-04-22 LAB — VITAMIN B12: Vitamin B-12: 384 pg/mL (ref 211–911)

## 2022-04-22 LAB — LIPID PANEL
Cholesterol: 122 mg/dL (ref 0–200)
HDL: 55.6 mg/dL (ref >39.00)
LDL Cholesterol: 53 mg/dL (ref 0–99)
NonHDL: 66.85
Total CHOL/HDL Ratio: 2
Triglycerides: 69 mg/dL (ref 0.0–149.0)
VLDL: 13.8 mg/dL (ref 0.0–40.0)

## 2022-04-22 LAB — HEMOGLOBIN A1C: Hgb A1c MFr Bld: 5.8 % (ref 4.6–6.5)

## 2022-04-22 MED ORDER — FUROSEMIDE 20 MG PO TABS: ORAL_TABLET | ORAL | 0 refills | Status: DC

## 2022-04-22 MED ORDER — ATORVASTATIN CALCIUM 20 MG PO TABS: 20.0000 mg | ORAL_TABLET | Freq: Every day | ORAL | 1 refills | Status: DC

## 2022-04-22 NOTE — Assessment & Plan Note (Signed)
Chronic Blood pressure well controlled CMP Continue hydralazine 100 mg 3 times daily, metoprolol XL 25 mg daily

## 2022-04-22 NOTE — Assessment & Plan Note (Signed)
Has tingling in b/l feet ? neuropathy ? B12 def - check B12 level ? Related to chronic back pain Does not bother her enough at this time to evaluate further monitor

## 2022-04-22 NOTE — Assessment & Plan Note (Addendum)
Chronic Has seen neurology and orthopedics Orthopedics prescribed a Medrol Dosepak and she did physical therapy Denies any headaches now  Taking gabapentin 100 mg in am and 200 mg nightly - continue

## 2022-04-22 NOTE — Assessment & Plan Note (Signed)
Chronic GERD controlled Continue pantoprazole 40 mg daily

## 2022-04-22 NOTE — Assessment & Plan Note (Signed)
Chronic Appears to be euvolemic Continue furosemide 20 mg twice daily CMP

## 2022-04-22 NOTE — Assessment & Plan Note (Signed)
Chronic °Regular exercise and healthy diet encouraged °Check lipid panel  °Continue atorvastatin 20 mg daily °

## 2022-04-22 NOTE — Assessment & Plan Note (Signed)
Chronic   Lab Results  Component Value Date   HGBA1C 6.0 02/28/2021   Sugars well controlled Check A1c, urine microalbumin today Continue diet control Stressed regular exercise, diabetic diet

## 2022-04-22 NOTE — Addendum Note (Signed)
Addended by: Binnie Rail on: 04/22/2022 08:58 AM   Modules accepted: Level of Service

## 2022-05-14 NOTE — Progress Notes (Unsigned)
Referring:  Binnie Rail, MD [place],  [place] [address]  PCP: Binnie Rail, MD   CC:  headaches  History provided from self, daughter  Follow-up visit:  Prior visit: 01/07/2022 with Dr. Billey Gosling (initial visit)  HPI:   Susan Brady is a 77 y.o. female who is being followed for daily left-sided headaches and cervicalgia with radiculopathy. CTH with left-sided sinus disease and partially empty sella likely incidental finding as this was present on prior MRI in 2021 and recent eye exam normal.  At prior visit, was started on gabapentin and recommended completion of MRI C-spine for LUE radicular symptoms.   Interval history:  Reports headaches have improved since prior visit, can occur 1-2x per week, still affecting left side starting occipital region and radiating up to ear, use of gabapentin 100mg  AM and 200mg  PM, tolerating well.  After completion of MRI C-spine (results below), she was referred to orthopedics as it was felt this could be contributing to left-sided headaches and neck pain with radiculopathy.  She was seen by orthopedics back in December who referred her to PT. Per ortho note, if she failed conservative measures, could consider injection therapy.  Only did 1 session of PT and did not feel further therapy was needed per daughter.  Patient reports improvement of neck pain and denies any present radiculopathy.     MRI C-spine 01/09/2022 IMPRESSION: This MRI of the cervical spine without contrast shows the following: Moderately severe spinal stenosis at C3-C4 and C4-C5, severe spinal stenosis at C5-C6 and mild to moderate spinal stenosis at C6-C7 due to degenerative changes as detailed above. Multilevel foraminal narrowing as detailed above.  There is some encroachment upon the left C5 and C6 nerve roots at C4-C5 and C5-C6 without definite nerve root compression.  There is moderately severe left foraminal narrowing at C6-C7 with potential for C7  nerve root compression. The spinal cord has normal signal.      Headache days per month: 4-8 Headache free days per month: 22-26  Current Treatment: Abortive Tylenol  Preventative Gabapentin 100/200  Prior Therapies                                 Tylenol - helps Metoprolol Lisinopirl - cough   LABS: CBC    Component Value Date/Time   WBC 5.7 01/02/2022 0404   RBC 4.15 01/02/2022 0404   HGB 11.8 (L) 01/02/2022 0404   HCT 37.8 01/02/2022 0404   PLT 274 01/02/2022 0404   MCV 91.1 01/02/2022 0404   MCH 28.4 01/02/2022 0404   MCHC 31.2 01/02/2022 0404   RDW 13.5 01/02/2022 0404   LYMPHSABS 2.1 08/28/2020 1018   MONOABS 0.7 08/28/2020 1018   EOSABS 0.3 08/28/2020 1018   BASOSABS 0.0 08/28/2020 1018      Latest Ref Rng & Units 04/22/2022    8:49 AM 01/02/2022    4:04 AM 02/28/2021    9:43 AM  CMP  Glucose 70 - 99 mg/dL 92  114  91   BUN 6 - 23 mg/dL 12  13  11    Creatinine 0.40 - 1.20 mg/dL 0.73  0.82  0.71   Sodium 135 - 145 mEq/L 142  142  140   Potassium 3.5 - 5.1 mEq/L 3.9  3.8  3.9   Chloride 96 - 112 mEq/L 104  107  104   CO2 19 - 32 mEq/L 31  26  31   Calcium 8.4 - 10.5 mg/dL 9.6  9.1  9.2   Total Protein 6.0 - 8.3 g/dL 6.9   6.8   Total Bilirubin 0.2 - 1.2 mg/dL 0.5   0.6   Alkaline Phos 39 - 117 U/L 43   50   AST 0 - 37 U/L 18   17   ALT 0 - 35 U/L 9   11    ESR 17  IMAGING:  CTH 01/02/22: 1. No acute intracranial CT findings. 2. Mild atrophy and small-vessel disease, new from 2016. 3. Mildly expanded empty sella. Nonspecific finding but can be seen with idiopathic intracranial hypertension. 4. Sinus disease.  Imaging independently reviewed on May 15, 2022   Current Outpatient Medications on File Prior to Visit  Medication Sig Dispense Refill   acetaminophen (TYLENOL) 500 MG tablet Take 500-1,000 mg by mouth every 6 (six) hours as needed for mild pain.     atorvastatin (LIPITOR) 20 MG tablet Take 1 tablet (20 mg total) by mouth daily. 90  tablet 1   cholecalciferol (VITAMIN D) 1000 UNITS tablet Take 5,000 Units by mouth daily.      flecainide (TAMBOCOR) 150 MG tablet TAKE 1/2 TABLET(75 MG) BY MOUTH TWICE DAILY 90 tablet 3   furosemide (LASIX) 20 MG tablet TAKE 1 TABLET(20 MG) BY MOUTH TWICE DAILY 180 tablet 0   gabapentin (NEURONTIN) 100 MG capsule Take 3-4 capsules (300-400 mg total) by mouth at bedtime. 123456 capsule 5   Garlic 123XX123 MG CAPS Take 1,000 mg by mouth daily.     hydrALAZINE (APRESOLINE) 100 MG tablet TAKE 1 TABLET(100 MG) BY MOUTH THREE TIMES DAILY 270 tablet 2   metoprolol succinate (TOPROL XL) 25 MG 24 hr tablet Take 1 tablet (25 mg total) by mouth daily. 90 tablet 3   Omega-3 Fatty Acids (FISH OIL) 1000 MG CAPS Take 1 capsule by mouth daily.     pantoprazole (PROTONIX) 40 MG tablet TAKE 1 TABLET(40 MG) BY MOUTH DAILY 90 tablet 2   potassium chloride SA (KLOR-CON M) 20 MEQ tablet TAKE 1 TABLET(20 MEQ) BY MOUTH DAILY 90 tablet 2   vitamin E 400 UNIT capsule Take 1,000 Units by mouth daily.      No current facility-administered medications on file prior to visit.     Allergies: Allergies  Allergen Reactions   Lisinopril Anaphylaxis    cough   Trazodone And Nefazodone     hallucinations    Family History: Migraine or other headaches in the family:  none Aneurysms in a first degree relative:  none Brain tumors in the family:  none Other neurological illness in the family:   none  Past Medical History: Past Medical History:  Diagnosis Date   Anemia    CHF (congestive heart failure) ([place])    Diabetes mellitus without complication ([place])    Gastric ulcer    Glaucoma    Hiatal hernia    Hyperlipidemia    Hypertension     Past Surgical History Past Surgical History:  Procedure Laterality Date   LEFT HEART CATH AND CORONARY ANGIOGRAPHY N/A 07/25/2016   Procedure: Left Heart Cath and Coronary Angiography;  Surgeon: Burnell Blanks, MD;  Location: [facility];  Service: Cardiovascular;   Laterality: N/A;    Social History: Social History   Tobacco Use   Smoking status: Former   Smokeless tobacco: Never  Scientific laboratory technician Use: Never used  Substance Use Topics   Alcohol use: No  Alcohol/week: 0.0 standard drinks of alcohol   Drug use: No     ROS: Negative for fevers, chills. Positive for headaches. All other systems reviewed and negative unless stated otherwise in HPI.   Physical Exam:   Vital Signs: BP 126/71   Pulse (!) 55   Ht 5\' 8"  (1.727 m)   Wt 195 lb (88.5 kg)   BMI 29.65 kg/m  GENERAL: well appearing, very pleasant elderly African-American female, in no acute distress,alert SKIN:  Color, texture, turgor normal. No rashes or lesions HEAD:  Normocephalic/atraumatic. CV:  RRR RESP: Normal respiratory effort  NEUROLOGICAL: Mental Status: Alert, oriented to person, place and time,Follows commands Cranial Nerves: PERRL, no papilledema visualized, visual fields intact to confrontation, extraocular movements intact, facial sensation intact, no facial droop or ptosis, hearing grossly intact, no dysarthria Motor: muscle strength 5/5 both upper and lower extremities,no drift, normal tone Reflexes: 2+ throughout Sensation: decreased sensation to light touch over LUE, otherwise sensation to light touch intact throughout Coordination: Finger-to- nose-finger intact bilaterally Gait: normal-based    IMPRESSION: 77 year old female with a history of HTN, HLD, glaucoma who presents for follow-up of left sided headaches. CTH with left-sided sinus disease and partially empty sella. Suspect empty sella is an incidental finding as it was present on prior MRI in 2021 and she has had a recent normal eye exam.  MRI C-spine showed moderately severe spinal stenosis at C3-C4 and C4-C5, severe spinal stenosis at C5-C6 and moderate spinal stenosis at C6-C7 and moderately severe left foraminal narrowing at C6-C7 with potential for C7 nerve root compression.  Referred to  orthopedics and continued on gabapentin.  Improvement of symptoms since prior visit on gabapentin.    PLAN:  -Continue gabapentin 100mg  AM and 200mg  PM -Advised to contact orthopedics if neck pain with radiculopathy should reoccur    Follow-up in 6 months or call earlier if needed     I spent 23 minutes of face-to-face and non-face-to-face time with patient and daughter.  This included previsit chart review, lab review, study review, order entry, electronic health record documentation, patient education and discussion regarding the above and answered all other questions to patient's satisfaction  Frann Rider, [facility]  [facility] [address] [place], [place] [address]  Phone [phone] Fax [phone] Note: This document was prepared with digital dictation and possible smart phrase technology. Any transcriptional errors that result from this process are unintentional.

## 2022-05-15 ENCOUNTER — Encounter: Payer: Self-pay | Admitting: Adult Health

## 2022-05-15 ENCOUNTER — Ambulatory Visit (INDEPENDENT_AMBULATORY_CARE_PROVIDER_SITE_OTHER): Payer: 59 | Admitting: Adult Health

## 2022-05-15 VITALS — BP 126/71 | HR 55 | Ht 68.0 in | Wt 195.0 lb

## 2022-05-15 DIAGNOSIS — G4486 Cervicogenic headache: Secondary | ICD-10-CM

## 2022-05-15 NOTE — Patient Instructions (Addendum)
Your Plan:  Continue gabapentin 100mg  AM and 200mg  PM  Follow back up with orthopedics if you have reoccurrence of neck pain     Follow up in 6 months or call earlier if needed     Thank you for coming to see [place] at [facility]. I hope we have been able to provide you high quality care today.  You may receive a patient satisfaction survey over the next few weeks. We would appreciate your feedback and comments so that we may continue to improve ourselves and the health of our patients.

## 2022-07-09 ENCOUNTER — Other Ambulatory Visit: Payer: Self-pay | Admitting: Internal Medicine

## 2022-07-19 ENCOUNTER — Encounter: Payer: Self-pay | Admitting: Internal Medicine

## 2022-07-19 ENCOUNTER — Ambulatory Visit (INDEPENDENT_AMBULATORY_CARE_PROVIDER_SITE_OTHER): Payer: 59 | Admitting: Internal Medicine

## 2022-07-19 VITALS — BP 138/76 | HR 79 | Temp 98.0°F | Ht 68.0 in | Wt 193.0 lb

## 2022-07-19 DIAGNOSIS — R109 Unspecified abdominal pain: Secondary | ICD-10-CM | POA: Diagnosis not present

## 2022-07-19 MED ORDER — PREDNISONE 20 MG PO TABS: 40.0000 mg | ORAL_TABLET | Freq: Every day | ORAL | 0 refills | Status: AC

## 2022-07-19 MED ORDER — PANTOPRAZOLE SODIUM 40 MG PO TBEC: DELAYED_RELEASE_TABLET | ORAL | 2 refills | Status: DC

## 2022-07-19 NOTE — Patient Instructions (Addendum)
      Medications changes include :   prednisone 40 mg daily x 5 days     Return if symptoms worsen or fail to improve.  

## 2022-07-19 NOTE — Assessment & Plan Note (Signed)
Acute Has had an episode in the past-last fall Pain is across lower back and into the left side-tender palpation, worse with certain positions, changes in positions Likely musculoskeletal in nature No relief with heating pad, Tylenol help minimal Prednisone 40 mg daily x 5 days-advised to take with food.  Discussed possible side effects Deferred referral to sports medicine, physical therapy at this time, but will let me know if she would like to be that Call if no improvement

## 2022-07-19 NOTE — Progress Notes (Signed)
Subjective:    Patient ID: Susan Brady, female    DOB: [birth year], 77 y.o.   MRN: [id number]      HPI Belvie is here for No chief complaint on file.    Started two weeks - no obvious cause.  The pain is intermittent.  It is worse at night when laying and getting up/changing positions.  Tylenol helped minimally.  Heating pad did not help.  No leg pain or N/T.  No abdominal pain.    Has had pain similar in past.  Medications and allergies reviewed with patient and updated if appropriate.  Current Outpatient Medications on File Prior to Visit  Medication Sig Dispense Refill   acetaminophen (TYLENOL) 500 MG tablet Take 500-1,000 mg by mouth every 6 (six) hours as needed for mild pain.     atorvastatin (LIPITOR) 20 MG tablet Take 1 tablet (20 mg total) by mouth daily. 90 tablet 1   cholecalciferol (VITAMIN D) 1000 UNITS tablet Take 5,000 Units by mouth daily.      flecainide (TAMBOCOR) 150 MG tablet TAKE 1/2 TABLET(75 MG) BY MOUTH TWICE DAILY 90 tablet 3   furosemide (LASIX) 20 MG tablet TAKE 1 TABLET(20 MG) BY MOUTH TWICE DAILY 180 tablet 0   gabapentin (NEURONTIN) 100 MG capsule TAKE 3 TO 4 CAPSULES(300 TO 400 MG) BY MOUTH AT BEDTIME 360 capsule 0   Garlic 1000 MG CAPS Take 1,000 mg by mouth daily.     hydrALAZINE (APRESOLINE) 100 MG tablet TAKE 1 TABLET(100 MG) BY MOUTH THREE TIMES DAILY 270 tablet 2   metoprolol succinate (TOPROL XL) 25 MG 24 hr tablet Take 1 tablet (25 mg total) by mouth daily. 90 tablet 3   Omega-3 Fatty Acids (FISH OIL) 1000 MG CAPS Take 1 capsule by mouth daily.     pantoprazole (PROTONIX) 40 MG tablet TAKE 1 TABLET(40 MG) BY MOUTH DAILY 90 tablet 2   potassium chloride SA (KLOR-CON M) 20 MEQ tablet TAKE 1 TABLET(20 MEQ) BY MOUTH DAILY 90 tablet 2   vitamin E 400 UNIT capsule Take 1,000 Units by mouth daily.      No current facility-administered medications on file prior to visit.    Review of Systems  Constitutional:  Negative for fever.   Gastrointestinal:  Negative for abdominal pain, constipation and diarrhea.  Musculoskeletal:  Positive for back pain.  Neurological:  Negative for numbness.       Objective:   Vitals:   07/19/22 1331  BP: 138/76  Pulse: 79  Temp: 98 F (36.7 C)  SpO2: 93%   BP Readings from Last 3 Encounters:  07/19/22 138/76  05/15/22 126/71  04/22/22 128/72   Wt Readings from Last 3 Encounters:  07/19/22 193 lb (87.5 kg)  05/15/22 195 lb (88.5 kg)  04/22/22 193 lb 3.2 oz (87.6 kg)   Body mass index is 29.35 kg/m.    Physical Exam Constitutional:      General: She is not in acute distress.    Appearance: Normal appearance. She is not ill-appearing.  HENT:     Head: Normocephalic and atraumatic.  Abdominal:     General: There is no distension.     Palpations: Abdomen is soft.     Tenderness: There is no abdominal tenderness. There is no guarding or rebound.  Musculoskeletal:     Right lower leg: No edema.     Left lower leg: No edema.     Comments: Tenderness across lower back to left side - under rib cage -  worse with movement  Skin:    General: Skin is warm and dry.     Findings: No rash.  Neurological:     Mental Status: She is alert.     Sensory: No sensory deficit.            Assessment & Plan:    See Problem List for Assessment and Plan of chronic medical problems.

## 2022-07-28 ENCOUNTER — Other Ambulatory Visit: Payer: Self-pay | Admitting: Internal Medicine

## 2022-08-10 ENCOUNTER — Other Ambulatory Visit: Payer: Self-pay | Admitting: Cardiology

## 2022-09-09 DIAGNOSIS — H11053 Peripheral pterygium, progressive, bilateral: Secondary | ICD-10-CM | POA: Diagnosis not present

## 2022-09-18 NOTE — Progress Notes (Deleted)
Cardiology Clinic Note   Patient Name: Genee Fialkowski Date of Encounter: 09/18/2022  Primary Care Provider:  Pincus Sanes, MD Primary Cardiologist:  None  Patient Profile    Glema Comfort 77 year old female presents the clinic today for follow-up evaluation of her chronic diastolic CHF, SVT, and hypertension.  Past Medical History    Past Medical History:  Diagnosis Date   Anemia    CHF (congestive heart failure) (HCC)    Diabetes mellitus without complication (HCC)    Gastric ulcer    Glaucoma    Hiatal hernia    Hyperlipidemia    Hypertension    Past Surgical History:  Procedure Laterality Date   LEFT HEART CATH AND CORONARY ANGIOGRAPHY N/A 07/25/2016   Procedure: Left Heart Cath and Coronary Angiography;  Surgeon: Kathleene Hazel, MD;  Location: [facility];  Service: Cardiovascular;  Laterality: N/A;    Allergies  Allergies  Allergen Reactions   Lisinopril Anaphylaxis    cough   Trazodone And Nefazodone     hallucinations    History of Present Illness    Krystin Rudden has a PMH of anemia, CHF, diabetes, gastric ulcer, glaucoma, hiatal hernia, hyperlipidemia, hypertension, and normal coronary arteries.  Her PMH also includes palpitations.  She had an abnormal nuclear stress test however, she underwent cardiac catheterization which showed normal coronary arteries.  She was noted to have cardiomegaly on chest x-ray.  Subsequent echocardiogram showed normal LV function, G2 DD.  She wore a cardiac event monitor which showed frequent sustained SVT.  She was seen in follow-up by Dr. Graciela Husbands who felt the rhythm was atrial tachycardia.  She was treated with flecainide.  She noted breakthrough palpitations and her flecainide was increased from 50 mg twice daily to 75 mg twice daily.  She was seen in follow-up by Dr. Antoine Poche on 06/20/2021.  During that time she reported that she would occasionally get palpitations.  She did not feel that they were particularly  bad.  She noticed them mainly at night and will reported they were self-limiting.  She denied presyncope and syncope.  She did feel better with her increased flecainide dosing.  She was noted to have a low resting heart rate but was tolerating it well.  Her metoprolol was reduced to 25 mg daily.  She presents to the clinic today for follow-up evaluation and states***.  *** denies chest pain, shortness of breath, lower extremity edema, fatigue, palpitations, melena, hematuria, hemoptysis, diaphoresis, weakness, presyncope, syncope, orthopnea, and PND.  Palpitations, SVT-EKG today shows***.  Continues to report occasional episodes of brief palpitations mainly in the evening. Continue flecainide, metoprolol Avoid triggers caffeine, chocolate, EtOH, dehydration etc.  Essential hypertension-BP today***. Maintain blood pressure log Continue metoprolol, hydralazine Heart healthy low-sodium diet  Hyperlipidemia-LDL***. High-fiber diet Continue atorvastatin, omega-3 fatty acids  Cardiomegaly-noted on chest x-ray.  Subsequent echocardiogram showed normal LV function. Maintain good blood pressure control Increase physical activity as tolerated  Abnormal stress testing-underwent cardiac catheterization which showed normal coronary arteries. Continue current medical therapy  Disposition: Follow-up with Dr. Antoine Poche or me in 12 months.  Home Medications    Prior to Admission medications   Medication Sig Start Date End Date Taking? Authorizing Provider  acetaminophen (TYLENOL) 500 MG tablet Take 500-1,000 mg by mouth every 6 (six) hours as needed for mild pain.    [provider]  atorvastatin (LIPITOR) 20 MG tablet Take 1 tablet (20 mg total) by mouth daily. 04/22/22   Pincus Sanes, MD  cholecalciferol (VITAMIN D) 1000 UNITS tablet Take 5,000 Units by mouth daily.     [provider]  flecainide (TAMBOCOR) 150 MG tablet Take 1 tablet (150 mg total) by mouth 2 (two) times daily.  08/12/22   Rollene Rotunda, MD  furosemide (LASIX) 20 MG tablet TAKE 1 TABLET(20 MG) BY MOUTH TWICE DAILY 07/29/22   Pincus Sanes, MD  gabapentin (NEURONTIN) 100 MG capsule TAKE 3 TO 4 CAPSULES(300 TO 400 MG) BY MOUTH AT BEDTIME 07/09/22   Pincus Sanes, MD  Garlic 1000 MG CAPS Take 1,000 mg by mouth daily.    [provider]  hydrALAZINE (APRESOLINE) 100 MG tablet TAKE 1 TABLET(100 MG) BY MOUTH THREE TIMES DAILY 12/07/21   Pincus Sanes, MD  metoprolol succinate (TOPROL XL) 25 MG 24 hr tablet Take 1 tablet (25 mg total) by mouth daily. 06/20/21   Rollene Rotunda, MD  Omega-3 Fatty Acids (FISH OIL) 1000 MG CAPS Take 1 capsule by mouth daily.    [provider]  pantoprazole (PROTONIX) 40 MG tablet TAKE 1 TABLET(40 MG) BY MOUTH DAILY 07/19/22   Burns, Bobette Mo, MD  potassium chloride SA (KLOR-CON M) 20 MEQ tablet TAKE 1 TABLET(20 MEQ) BY MOUTH DAILY 03/25/22   Pincus Sanes, MD  vitamin E 400 UNIT capsule Take 1,000 Units by mouth daily.     [provider]    Family History    Family History  Problem Relation Age of Onset   Heart disease Mother    Alcohol abuse Father    Alcohol abuse Sister    Diabetes Sister    Hypertension Sister    Cancer Sister    CAD Sister        One sister with stents   Alcohol abuse Brother    Diabetes Brother    Hypertension Brother    She indicated that her mother is deceased. She indicated that her father is deceased. She indicated that the status of her sister is unknown. She indicated that the status of her brother is unknown.  Social History    Social History   Socioeconomic History   Marital status: Divorced    Spouse name: Not on file   Number of children: Not on file   Years of education: Not on file   Highest education level: Not on file  Occupational History   Not on file  Tobacco Use   Smoking status: Former   Smokeless tobacco: Never  Vaping Use   Vaping status: Never Used  Substance and Sexual Activity    Alcohol use: No    Alcohol/week: 0.0 standard drinks of alcohol   Drug use: No   Sexual activity: Not on file  Other Topics Concern   Not on file  Social History Narrative   Not on file   Social Determinants of Health   Financial Resource Strain: Low Risk  (03/07/2022)   Overall Financial Resource Strain (CARDIA)    Difficulty of Paying Living Expenses: Not hard at all  Food Insecurity: No Food Insecurity (03/07/2022)   Hunger Vital Sign    Worried About Running Out of Food in the Last Year: Never true    Ran Out of Food in the Last Year: Never true  Transportation Needs: No Transportation Needs (03/07/2022)   PRAPARE - Administrator, Civil Service (Medical): No    Lack of Transportation (Non-Medical): No  Physical Activity: Inactive (03/07/2022)   Exercise Vital Sign    Days of Exercise  per Week: 0 days    Minutes of Exercise per Session: 0 min  Stress: No Stress Concern Present (03/07/2022)   [employer] of Occupational Health - Occupational Stress Questionnaire    Feeling of Stress : Not at all  Social Connections: Moderately Integrated (03/07/2022)   Social Connection and Isolation Panel [NHANES]    Frequency of Communication with Friends and Family: More than three times a week    Frequency of Social Gatherings with Friends and Family: More than three times a week    Attends Religious Services: More than 4 times per year    Active Member of [employer] or Organizations: Yes    Attends Engineer, structural: More than 4 times per year    Marital Status: Divorced  Recent Concern: Social Connections - Socially Isolated (03/07/2022)   Social Connection and Isolation Panel [NHANES]    Frequency of Communication with Friends and Family: More than three times a week    Frequency of Social Gatherings with Friends and Family: More than three times a week    Attends Religious Services: Never    Database administrator or Organizations: No    Attends Tax inspector Meetings: Never    Marital Status: Never married  Intimate Partner Violence: Not At Risk (03/07/2022)   Humiliation, Afraid, Rape, and Kick questionnaire    Fear of Current or Ex-Partner: No    Emotionally Abused: No    Physically Abused: No    Sexually Abused: No     Review of Systems    General:  No chills, fever, night sweats or weight changes.  Cardiovascular:  No chest pain, dyspnea on exertion, edema, orthopnea, palpitations, paroxysmal nocturnal dyspnea. Dermatological: No rash, lesions/masses Respiratory: No cough, dyspnea Urologic: No hematuria, dysuria Abdominal:   No nausea, vomiting, diarrhea, bright red blood per rectum, melena, or hematemesis Neurologic:  No visual changes, wkns, changes in mental status. All other systems reviewed and are otherwise negative except as noted above.  Physical Exam    VS:  There were no vitals taken for this visit. , BMI There is no height or weight on file to calculate BMI. GEN: Well nourished, well developed, in no acute distress. HEENT: normal. Neck: Supple, no JVD, carotid bruits, or masses. Cardiac: RRR, no murmurs, rubs, or gallops. No clubbing, cyanosis, edema.  Radials/DP/PT 2+ and equal bilaterally.  Respiratory:  Respirations regular and unlabored, clear to auscultation bilaterally. GI: Soft, nontender, nondistended, BS + x 4. MS: no deformity or atrophy. Skin: warm and dry, no rash. Neuro:  Strength and sensation are intact. Psych: Normal affect.  Accessory Clinical Findings    Recent Labs: 01/02/2022: Hemoglobin 11.8; Platelets 274 04/22/2022: ALT 9; BUN 12; Creatinine, Ser 0.73; Potassium 3.9; Sodium 142   Recent Lipid Panel    Component Value Date/Time   CHOL 122 04/22/2022 0849   TRIG 69.0 04/22/2022 0849   HDL 55.60 04/22/2022 0849   CHOLHDL 2 04/22/2022 0849   VLDL 13.8 04/22/2022 0849   LDLCALC 53 04/22/2022 0849    No BP recorded.  {Refresh Note OR Click here to enter BP  :1}***    ECG  personally reviewed by me today- ***     Echocardiogram 08/20/2016 Study Conclusions   - Left ventricle: The cavity size was normal. Wall thickness was    normal. Systolic function was normal. The estimated ejection    fraction was in the range of 60% to 65%. Wall motion was normal;    there  were no regional wall motion abnormalities. Features are    consistent with a pseudonormal left ventricular filling pattern,    with concomitant abnormal relaxation and increased filling    pressure (grade 2 diastolic dysfunction).  - Mitral valve: Calcified annulus.  - Right atrium: The atrium was mildly dilated.  - Pulmonary arteries: Systolic pressure was mildly increased. PA    peak pressure: 34 mm Hg (S).   -------------------------------------------------------------------  Study data:  No prior study was available for comparison.  Study  status:  Routine.  Procedure:  The patient reported no pain pre or  post test. Transthoracic echocardiography for diagnosis, for left  ventricular function evaluation, for right ventricular function  evaluation, and for assessment of valvular function. Image quality  was adequate. The study was technically difficult, as a result of  poor sound wave transmission. Intravenous contrast (Definity) was  administered to opacify the LV.          Transthoracic  echocardiography.  M-mode, complete 2D, spectral Doppler, and color  Doppler.  Birthdate:  Patient birthdate: [birth year].  Age:  Patient  is 77 yr old.  Sex:  Gender: female.    BMI: 33.9 kg/m^2.  Blood  pressure:     126/60  Patient status:  Outpatient.  Study date:  Study date: 08/20/2016. Study time: 09:03 AM.  Location:  [facility]   -------------------------------------------------------------------   -------------------------------------------------------------------  Left ventricle:  The cavity size was normal. Wall thickness was  normal. Systolic function was normal. The estimated  ejection  fraction was in the range of 60% to 65%. Wall motion was normal;  there were no regional wall motion abnormalities. Features are  consistent with a pseudonormal left ventricular filling pattern,  with concomitant abnormal relaxation and increased filling pressure  (grade 2 diastolic dysfunction).   -------------------------------------------------------------------  Aortic valve:   Structurally normal valve.   Cusp separation was  normal.  Doppler:  Transvalvular velocity was within the normal  range. There was no stenosis. There was no regurgitation.   -------------------------------------------------------------------  Aorta: Aortic root: The aortic root was normal in size.  Ascending aorta: The ascending aorta was normal in size.   -------------------------------------------------------------------  Mitral valve:   Calcified annulus. Leaflet separation was normal.  Doppler:  Transvalvular velocity was within the normal range. There  was no evidence for stenosis. There was no regurgitation.    Peak  gradient (D): 4 mm Hg.   -------------------------------------------------------------------  Left atrium:  The atrium was normal in size.   -------------------------------------------------------------------  Right ventricle:  The cavity size was normal. Systolic function was  normal.   -------------------------------------------------------------------  Pulmonic valve:    The valve appears to be grossly normal.  Doppler:  There was no significant regurgitation.   -------------------------------------------------------------------  Tricuspid valve:   The valve appears to be grossly normal.  Doppler:  There was mild regurgitation.   -------------------------------------------------------------------  Pulmonary artery:   Systolic pressure was mildly increased.   -------------------------------------------------------------------  Right atrium:  The atrium was mildly  dilated.   -------------------------------------------------------------------  Systemic veins:  Inferior vena cava: The vessel was normal in size. The  respirophasic diameter changes were in the normal range (>= 50%),  consistent with normal central venous pressure.   Cardiac catheterization 07/25/2016  1. No angiographic evidence of CAD   Recommendations: No further ischemic workup.   Diagnostic Dominance: Right  Intervention  Cardiac event monitor 10/27/2019  Normal sinus rhythm Occasional non sustained supraventricular tachycardia with the longest run being 9  beats. No sustained arrhythmia  Assessment & Plan   1.  ***   Thomasene Ripple.  NP-C     09/18/2022, 9:49 AM [facility] [address] Office [phone] Fax [phone]    I spent***minutes examining this patient, reviewing medications, and using patient centered shared decision making involving her cardiac care.  Prior to her visit I spent greater than 20 minutes reviewing her past medical history,  medications, and prior cardiac tests.

## 2022-09-20 ENCOUNTER — Ambulatory Visit: Payer: 59 | Admitting: General Practice

## 2022-09-22 ENCOUNTER — Other Ambulatory Visit: Payer: Self-pay | Admitting: Internal Medicine

## 2022-10-01 ENCOUNTER — Other Ambulatory Visit: Payer: Self-pay | Admitting: Internal Medicine

## 2022-10-01 ENCOUNTER — Other Ambulatory Visit: Payer: Self-pay | Admitting: Cardiology

## 2022-10-02 ENCOUNTER — Other Ambulatory Visit: Payer: Self-pay | Admitting: Cardiology

## 2022-10-16 ENCOUNTER — Other Ambulatory Visit: Payer: Self-pay | Admitting: Internal Medicine

## 2022-10-18 NOTE — Progress Notes (Unsigned)
Cardiology Clinic Note   Patient Name: Susan Brady Date of Encounter: 10/23/2022  Primary Care Provider:  Pincus Sanes, MD Primary Cardiologist:  None  Patient Profile    Susan Brady 77 year old female presents the clinic today for follow-up evaluation of her chronic diastolic CHF, SVT, and hypertension.  Past Medical History    Past Medical History:  Diagnosis Date   Anemia    CHF (congestive heart failure) (HCC)    Diabetes mellitus without complication (HCC)    Gastric ulcer    Glaucoma    Hiatal hernia    Hyperlipidemia    Hypertension    Past Surgical History:  Procedure Laterality Date   LEFT HEART CATH AND CORONARY ANGIOGRAPHY N/A 07/25/2016   Procedure: Left Heart Cath and Coronary Angiography;  Surgeon: Kathleene Hazel, MD;  Location: [facility];  Service: Cardiovascular;  Laterality: N/A;    Allergies  Allergies  Allergen Reactions   Lisinopril Anaphylaxis    cough   Trazodone And Nefazodone     hallucinations    History of Present Illness    Tempe Razzak has a PMH of anemia, CHF, diabetes, gastric ulcer, glaucoma, hiatal hernia, hyperlipidemia, hypertension, and normal coronary arteries.  Her PMH also includes palpitations.  She had an abnormal nuclear stress test however, she underwent cardiac catheterization which showed normal coronary arteries.  She was noted to have cardiomegaly on chest x-ray.  Subsequent echocardiogram showed normal LV function, G2 DD.  She wore a cardiac event monitor which showed frequent sustained SVT.  She was seen in follow-up by Dr. Graciela Husbands who felt the rhythm was atrial tachycardia.  She was treated with flecainide.  She noted breakthrough palpitations and her flecainide was increased from 50 mg twice daily to 75 mg twice daily.  She was seen in follow-up by Dr. Antoine Poche on 06/20/2021.  During that time she reported that she would occasionally get palpitations.  She did not feel that they were particularly  bad.  She noticed them mainly at night and will reported they were self-limiting.  She denied presyncope and syncope.  She did feel better with her increased flecainide dosing.  She was noted to have a low resting heart rate but was tolerating it well.  Her metoprolol was reduced to 25 mg daily.  She presents to the clinic today for follow-up evaluation and states she continues to note occasional episodes of palpitations throughout the week.  She notes them mainly in the evening.  They are brief.  We reviewed her snoring and she reports that she wakes up feeling like she has not slept.  Her daughter reports that she does snore.  Her Epworth sleep score is 12 and her STOP-BANG is 3.  I reviewed vagal maneuvers for SVT.  I will order a and home sleep study.  Continue her current medical therapy and plan follow-up in 12 months.  Today she denies chest pain, shortness of breath, lower extremity edema, fatigue, palpitations, melena, hematuria, hemoptysis, diaphoresis, weakness, presyncope, syncope, orthopnea, and PND.    Home Medications    Prior to Admission medications   Medication Sig Start Date End Date Taking? Authorizing Provider  acetaminophen (TYLENOL) 500 MG tablet Take 500-1,000 mg by mouth every 6 (six) hours as needed for mild pain.    [provider]  atorvastatin (LIPITOR) 20 MG tablet Take 1 tablet (20 mg total) by mouth daily. 04/22/22   Pincus Sanes, MD  cholecalciferol (VITAMIN D) 1000 UNITS tablet Take  5,000 Units by mouth daily.     [provider]  flecainide (TAMBOCOR) 150 MG tablet Take 1 tablet (150 mg total) by mouth 2 (two) times daily. 08/12/22   Rollene Rotunda, MD  furosemide (LASIX) 20 MG tablet TAKE 1 TABLET(20 MG) BY MOUTH TWICE DAILY 07/29/22   Pincus Sanes, MD  gabapentin (NEURONTIN) 100 MG capsule TAKE 3 TO 4 CAPSULES(300 TO 400 MG) BY MOUTH AT BEDTIME 07/09/22   Pincus Sanes, MD  Garlic 1000 MG CAPS Take 1,000 mg by mouth daily.    [provider]  hydrALAZINE (APRESOLINE) 100 MG tablet TAKE 1 TABLET(100 MG) BY MOUTH THREE TIMES DAILY 12/07/21   Pincus Sanes, MD  metoprolol succinate (TOPROL XL) 25 MG 24 hr tablet Take 1 tablet (25 mg total) by mouth daily. 06/20/21   Rollene Rotunda, MD  Omega-3 Fatty Acids (FISH OIL) 1000 MG CAPS Take 1 capsule by mouth daily.    [provider]  pantoprazole (PROTONIX) 40 MG tablet TAKE 1 TABLET(40 MG) BY MOUTH DAILY 07/19/22   Burns, Bobette Mo, MD  potassium chloride SA (KLOR-CON M) 20 MEQ tablet TAKE 1 TABLET(20 MEQ) BY MOUTH DAILY 03/25/22   Pincus Sanes, MD  vitamin E 400 UNIT capsule Take 1,000 Units by mouth daily.     [provider]    Family History    Family History  Problem Relation Age of Onset   Heart disease Mother    Alcohol abuse Father    Alcohol abuse Sister    Diabetes Sister    Hypertension Sister    Cancer Sister    CAD Sister        One sister with stents   Alcohol abuse Brother    Diabetes Brother    Hypertension Brother    She indicated that her mother is deceased. She indicated that her father is deceased. She indicated that the status of her sister is unknown. She indicated that the status of her brother is unknown.  Social History    Social History   Socioeconomic History   Marital status: Divorced    Spouse name: Not on file   Number of children: Not on file   Years of education: Not on file   Highest education level: Not on file  Occupational History   Not on file  Tobacco Use   Smoking status: Former   Smokeless tobacco: Never  Vaping Use   Vaping status: Never Used  Substance and Sexual Activity   Alcohol use: No    Alcohol/week: 0.0 standard drinks of alcohol   Drug use: No   Sexual activity: Not on file  Other Topics Concern   Not on file  Social History Narrative   Not on file   Social Determinants of Health   Financial Resource Strain: Low Risk  (03/07/2022)   Overall Financial Resource Strain (CARDIA)     Difficulty of Paying Living Expenses: Not hard at all  Food Insecurity: No Food Insecurity (03/07/2022)   Hunger Vital Sign    Worried About Running Out of Food in the Last Year: Never true    Ran Out of Food in the Last Year: Never true  Transportation Needs: No Transportation Needs (03/07/2022)   PRAPARE - Administrator, Civil Service (Medical): No    Lack of Transportation (Non-Medical): No  Physical Activity: Inactive (03/07/2022)   Exercise Vital Sign    Days of Exercise per Week: 0 days  Minutes of Exercise per Session: 0 min  Stress: No Stress Concern Present (03/07/2022)   [employer] of Occupational Health - Occupational Stress Questionnaire    Feeling of Stress : Not at all  Social Connections: Moderately Integrated (03/07/2022)   Social Connection and Isolation Panel [NHANES]    Frequency of Communication with Friends and Family: More than three times a week    Frequency of Social Gatherings with Friends and Family: More than three times a week    Attends Religious Services: More than 4 times per year    Active Member of [employer] or Organizations: Yes    Attends Engineer, structural: More than 4 times per year    Marital Status: Divorced  Recent Concern: Social Connections - Socially Isolated (03/07/2022)   Social Connection and Isolation Panel [NHANES]    Frequency of Communication with Friends and Family: More than three times a week    Frequency of Social Gatherings with Friends and Family: More than three times a week    Attends Religious Services: Never    Database administrator or Organizations: No    Attends Banker Meetings: Never    Marital Status: Never married  Intimate Partner Violence: Not At Risk (03/07/2022)   Humiliation, Afraid, Rape, and Kick questionnaire    Fear of Current or Ex-Partner: No    Emotionally Abused: No    Physically Abused: No    Sexually Abused: No     Review of Systems    General:  No chills,  fever, night sweats or weight changes.  Cardiovascular:  No chest pain, dyspnea on exertion, edema, orthopnea, palpitations, paroxysmal nocturnal dyspnea. Dermatological: No rash, lesions/masses Respiratory: No cough, dyspnea Urologic: No hematuria, dysuria Abdominal:   No nausea, vomiting, diarrhea, bright red blood per rectum, melena, or hematemesis Neurologic:  No visual changes, wkns, changes in mental status. All other systems reviewed and are otherwise negative except as noted above.  Physical Exam    VS:  BP 114/70 (BP Location: Left Arm, Patient Position: Sitting, Cuff Size: Normal)   Pulse 61   Ht 5\' 8"  (1.727 m)   Wt 193 lb 6.4 oz (87.7 kg)   SpO2 94%   BMI 29.41 kg/m  , BMI Body mass index is 29.41 kg/m. GEN: Well nourished, well developed, in no acute distress. HEENT: normal. Neck: Supple, no JVD, carotid bruits, or masses. Cardiac: RRR, no murmurs, rubs, or gallops. No clubbing, cyanosis, edema.  Radials/DP/PT 2+ and equal bilaterally.  Respiratory:  Respirations regular and unlabored, clear to auscultation bilaterally. GI: Soft, nontender, nondistended, BS + x 4. MS: no deformity or atrophy. Skin: warm and dry, no rash. Neuro:  Strength and sensation are intact. Psych: Normal affect.  Accessory Clinical Findings    Recent Labs: 01/02/2022: Hemoglobin 11.8; Platelets 274 04/22/2022: ALT 9; BUN 12; Creatinine, Ser 0.73; Potassium 3.9; Sodium 142   Recent Lipid Panel    Component Value Date/Time   CHOL 122 04/22/2022 0849   TRIG 69.0 04/22/2022 0849   HDL 55.60 04/22/2022 0849   CHOLHDL 2 04/22/2022 0849   VLDL 13.8 04/22/2022 0849   LDLCALC 53 04/22/2022 0849         ECG personally reviewed by me today- none today.     Echocardiogram 08/20/2016 Study Conclusions   - Left ventricle: The cavity size was normal. Wall thickness was    normal. Systolic function was normal. The estimated ejection    fraction was in the range of 60%  to 65%. Wall motion was  normal;    there were no regional wall motion abnormalities. Features are    consistent with a pseudonormal left ventricular filling pattern,    with concomitant abnormal relaxation and increased filling    pressure (grade 2 diastolic dysfunction).  - Mitral valve: Calcified annulus.  - Right atrium: The atrium was mildly dilated.  - Pulmonary arteries: Systolic pressure was mildly increased. PA    peak pressure: 34 mm Hg (S).   -------------------------------------------------------------------  Study data:  No prior study was available for comparison.  Study  status:  Routine.  Procedure:  The patient reported no pain pre or  post test. Transthoracic echocardiography for diagnosis, for left  ventricular function evaluation, for right ventricular function  evaluation, and for assessment of valvular function. Image quality  was adequate. The study was technically difficult, as a result of  poor sound wave transmission. Intravenous contrast (Definity) was  administered to opacify the LV.          Transthoracic  echocardiography.  M-mode, complete 2D, spectral Doppler, and color  Doppler.  Birthdate:  Patient birthdate: 07-19-1945.  Age:  Patient  is 77 yr old.  Sex:  Gender: female.    BMI: 33.9 kg/m^2.  Blood  pressure:     126/60  Patient status:  Outpatient.  Study date:  Study date: 08/20/2016. Study time: 09:03 AM.  Location:  [facility]   -------------------------------------------------------------------   -------------------------------------------------------------------  Left ventricle:  The cavity size was normal. Wall thickness was  normal. Systolic function was normal. The estimated ejection  fraction was in the range of 60% to 65%. Wall motion was normal;  there were no regional wall motion abnormalities. Features are  consistent with a pseudonormal left ventricular filling pattern,  with concomitant abnormal relaxation and increased filling pressure  (grade 2  diastolic dysfunction).   -------------------------------------------------------------------  Aortic valve:   Structurally normal valve.   Cusp separation was  normal.  Doppler:  Transvalvular velocity was within the normal  range. There was no stenosis. There was no regurgitation.   -------------------------------------------------------------------  Aorta: Aortic root: The aortic root was normal in size.  Ascending aorta: The ascending aorta was normal in size.   -------------------------------------------------------------------  Mitral valve:   Calcified annulus. Leaflet separation was normal.  Doppler:  Transvalvular velocity was within the normal range. There  was no evidence for stenosis. There was no regurgitation.    Peak  gradient (D): 4 mm Hg.   -------------------------------------------------------------------  Left atrium:  The atrium was normal in size.   -------------------------------------------------------------------  Right ventricle:  The cavity size was normal. Systolic function was  normal.   -------------------------------------------------------------------  Pulmonic valve:    The valve appears to be grossly normal.  Doppler:  There was no significant regurgitation.   -------------------------------------------------------------------  Tricuspid valve:   The valve appears to be grossly normal.  Doppler:  There was mild regurgitation.   -------------------------------------------------------------------  Pulmonary artery:   Systolic pressure was mildly increased.   -------------------------------------------------------------------  Right atrium:  The atrium was mildly dilated.   -------------------------------------------------------------------  Systemic veins:  Inferior vena cava: The vessel was normal in size. The  respirophasic diameter changes were in the normal range (>= 50%),  consistent with normal central venous pressure.   Cardiac  catheterization 07/25/2016  1. No angiographic evidence of CAD   Recommendations: No further ischemic workup.   Diagnostic Dominance: Right  Intervention  Cardiac event monitor 10/27/2019  Normal sinus rhythm  Occasional non sustained supraventricular tachycardia with the longest run being 9 beats. No sustained arrhythmia  Assessment & Plan   1.  Palpitations, SVT-heart rate today 61.  Continues to report occasional episodes of brief palpitations mainly in the evening. Continue flecainide, metoprolol Avoid triggers caffeine, chocolate, EtOH, dehydration etc.  Daytime somnolence-daughter reports snoring.  Wakes up tired.  Denies PND.  Epworth sleep score 12.  STOP-BANG 3. Avoid supine sleeping, elevate head of bed Order Intamar sleep test  Essential hypertension-BP today 114/70. Maintain blood pressure log Continue metoprolol, hydralazine Heart healthy low-sodium diet  Hyperlipidemia-LDL 53 on 04/22/22. High-fiber diet Continue atorvastatin, omega-3 fatty acids  Cardiomegaly-noted on chest x-ray.  Subsequent echocardiogram showed normal LV function. Maintain good blood pressure control Increase physical activity as tolerated  Abnormal stress testing-underwent cardiac catheterization which showed normal coronary arteries. Continue current medical therapy  Disposition: Follow-up with Dr. Antoine Poche or me in 12 months.   Thomasene Ripple. Orian Amberg NP-C     10/23/2022, 9:34 AM [facility] [address] Office [phone] Fax [phone]    I spent 14 minutes examining this patient, reviewing medications, and using patient centered shared decision making involving her cardiac care.  Prior to her visit I spent greater than 20 minutes reviewing her past medical history,  medications, and prior cardiac tests.

## 2022-10-22 ENCOUNTER — Encounter: Payer: Self-pay | Admitting: Internal Medicine

## 2022-10-22 NOTE — Progress Notes (Signed)
      Subjective:    Patient ID: Susan Brady, female    DOB: 1945/07/30, 77 y.o.   MRN: [id number]     HPI Saryah is here for follow up of her chronic medical problems.    Medications and allergies reviewed with patient and updated if appropriate.  Current Outpatient Medications on File Prior to Visit  Medication Sig Dispense Refill  . acetaminophen (TYLENOL) 500 MG tablet Take 500-1,000 mg by mouth every 6 (six) hours as needed for mild pain.    Marland Kitchen atorvastatin (LIPITOR) 20 MG tablet TAKE 1 TABLET BY MOUTH DAILY 90 tablet 1  . cholecalciferol (VITAMIN D) 1000 UNITS tablet Take 5,000 Units by mouth daily.     . flecainide (TAMBOCOR) 150 MG tablet Take 1 tablet (150 mg total) by mouth 2 (two) times daily. 30 tablet 3  . furosemide (LASIX) 20 MG tablet TAKE 1 TABLET(20 MG) BY MOUTH TWICE DAILY 180 tablet 0  . gabapentin (NEURONTIN) 100 MG capsule TAKE 3 TO 4 CAPSULES(300 TO 400 MG) BY MOUTH AT BEDTIME 360 capsule 0  . Garlic 1000 MG CAPS Take 1,000 mg by mouth daily.    . hydrALAZINE (APRESOLINE) 100 MG tablet TAKE 1 TABLET(100 MG) BY MOUTH THREE TIMES DAILY 270 tablet 2  . metoprolol succinate (TOPROL-XL) 25 MG 24 hr tablet TAKE 1 TABLET(25 MG) BY MOUTH DAILY 30 tablet 0  . Omega-3 Fatty Acids (FISH OIL) 1000 MG CAPS Take 1 capsule by mouth daily.    . pantoprazole (PROTONIX) 40 MG tablet TAKE 1 TABLET(40 MG) BY MOUTH DAILY 90 tablet 2  . potassium chloride SA (KLOR-CON M) 20 MEQ tablet TAKE 1 TABLET(20 MEQ) BY MOUTH DAILY 90 tablet 2  . vitamin E 400 UNIT capsule Take 1,000 Units by mouth daily.      No current facility-administered medications on file prior to visit.     Review of Systems     Objective:  There were no vitals filed for this visit. BP Readings from Last 3 Encounters:  10/23/22 114/70  07/19/22 138/76  05/15/22 126/71   Wt Readings from Last 3 Encounters:  10/23/22 193 lb 6.4 oz (87.7 kg)  07/19/22 193 lb (87.5 kg)  05/15/22 195 lb (88.5 kg)   There  is no height or weight on file to calculate BMI.    Physical Exam     Lab Results  Component Value Date   WBC 5.7 01/02/2022   HGB 11.8 (L) 01/02/2022   HCT 37.8 01/02/2022   PLT 274 01/02/2022   GLUCOSE 92 04/22/2022   CHOL 122 04/22/2022   TRIG 69.0 04/22/2022   HDL 55.60 04/22/2022   LDLCALC 53 04/22/2022   ALT 9 04/22/2022   AST 18 04/22/2022   NA 142 04/22/2022   K 3.9 04/22/2022   CL 104 04/22/2022   CREATININE 0.73 04/22/2022   BUN 12 04/22/2022   CO2 31 04/22/2022   TSH 0.479 06/02/2019   INR 1.1 09/17/2019   HGBA1C 5.8 04/22/2022   MICROALBUR 2.3 (H) 04/22/2022     Assessment & Plan:    See Problem List for Assessment and Plan of chronic medical problems.    This encounter was created in error - please disregard.

## 2022-10-22 NOTE — Patient Instructions (Addendum)
      Blood work was ordered.   The lab is on the first floor.    Medications changes include :   none    A referral was ordered and someone will call you to schedule an appointment.     Return in about 6 months (around 04/22/2023) for Physical Exam.

## 2022-10-23 ENCOUNTER — Encounter: Payer: Self-pay | Admitting: General Practice

## 2022-10-23 ENCOUNTER — Telehealth: Payer: Self-pay

## 2022-10-23 ENCOUNTER — Ambulatory Visit: Payer: 59 | Attending: General Practice | Admitting: General Practice

## 2022-10-23 ENCOUNTER — Encounter: Payer: 59 | Admitting: Internal Medicine

## 2022-10-23 VITALS — BP 114/70 | HR 61 | Ht 68.0 in | Wt 193.4 lb

## 2022-10-23 DIAGNOSIS — I471 Supraventricular tachycardia, unspecified: Secondary | ICD-10-CM

## 2022-10-23 DIAGNOSIS — R400 Somnolence: Secondary | ICD-10-CM

## 2022-10-23 DIAGNOSIS — I5032 Chronic diastolic (congestive) heart failure: Secondary | ICD-10-CM

## 2022-10-23 DIAGNOSIS — I10 Essential (primary) hypertension: Secondary | ICD-10-CM

## 2022-10-23 DIAGNOSIS — E119 Type 2 diabetes mellitus without complications: Secondary | ICD-10-CM

## 2022-10-23 DIAGNOSIS — I517 Cardiomegaly: Secondary | ICD-10-CM

## 2022-10-23 DIAGNOSIS — E782 Mixed hyperlipidemia: Secondary | ICD-10-CM | POA: Diagnosis not present

## 2022-10-23 DIAGNOSIS — G4486 Cervicogenic headache: Secondary | ICD-10-CM

## 2022-10-23 DIAGNOSIS — E7849 Other hyperlipidemia: Secondary | ICD-10-CM

## 2022-10-23 DIAGNOSIS — K219 Gastro-esophageal reflux disease without esophagitis: Secondary | ICD-10-CM

## 2022-10-23 NOTE — Assessment & Plan Note (Signed)
Chronic Has seen neurology and orthopedics Has done PT Denies any headaches now  Taking gabapentin 100 mg in am and 200 mg nightly - continue

## 2022-10-23 NOTE — Assessment & Plan Note (Signed)
Chronic Appears to be euvolemic Continue furosemide 20 mg twice daily CMP

## 2022-10-23 NOTE — Assessment & Plan Note (Signed)
Chronic GERD controlled Continue pantoprazole 40 mg daily 

## 2022-10-23 NOTE — Assessment & Plan Note (Signed)
Chronic With hyperlipidemia  Lab Results  Component Value Date   HGBA1C 5.8 04/22/2022   Sugars well controlled Check A1c Continue diet control Stressed regular exercise, diabetic diet

## 2022-10-23 NOTE — Telephone Encounter (Signed)
Pt here for appt and needs [employer] . Pt/daughter(Hazel Forman/POA in [place]) has filled out and signed release form(sent to be scanned). 193#, 5'8" Device S/N [id number] ex:12-18-23. Went over the .[place].

## 2022-10-23 NOTE — Patient Instructions (Addendum)
Medication Instructions:  The current medical regimen is effective;  continue present plan and medications as directed. Please refer to the Current Medication list given to you today.  *If you need a refill on your cardiac medications before your next appointment, please call your pharmacy*  Lab Work: NONE If you have labs (blood work) drawn today and your tests are completely normal, you will receive your results only by:  MyChart Message (if you have MyChart) OR  A paper copy in the mail If you have any lab test that is abnormal or we need to change your treatment, we will call you to review the results.  Please try to avoid these triggers: Do not use any products that have nicotine or tobacco in them. These include cigarettes, e-cigarettes, and chewing tobacco. If you need help quitting, ask your doctor. Eat heart-healthy foods. Talk with your doctor about the right eating plan for you. Exercise regularly as told by your doctor. Stay hydrated Do not drink alcohol, Caffeine or chocolate. Lose weight if you are overweight. Do not use drugs, including cannabis  Follow-Up: At [facility], you and your health needs are our priority.  As part of our continuing mission to provide you with exceptional heart care, we have created designated Provider Care Teams.  These Care Teams include your primary Cardiologist (physician) and Advanced Practice Providers (APPs -  Physician Assistants and Nurse Practitioners) who all work together to provide you with the care you need, when you need it.  We recommend signing up for the patient portal called "MyChart".  Sign up information is provided on this After Visit Summary.  MyChart is used to connect with patients for Virtual Visits (Telemedicine).  Patients are able to view lab/test results, encounter notes, upcoming appointments, etc.  Non-urgent messages can be sent to your provider as well.   To learn more about what you can do with MyChart, go to  [email or web address].    Your next appointment:   12 month(s)  Provider:   Rollene Rotunda, MD     OTHER: Vagal manoeuvres include: 1-Bearing down. Bearing down means that you try to breathe out with your stomach muscles but you don't let air out of your nose or mouth. 2-Putting an ice-cold, wet towel on your face. 3-Coughing or gagging.  4-Valsalva maneuver

## 2022-10-23 NOTE — Assessment & Plan Note (Signed)
Chronic °Regular exercise and healthy diet encouraged °Check lipid panel  °Continue atorvastatin 20 mg daily °

## 2022-10-23 NOTE — Assessment & Plan Note (Signed)
Chronic Blood pressure well controlled CMP Continue hydralazine 100 mg 3 times daily, metoprolol XL 25 mg daily

## 2022-10-31 NOTE — Progress Notes (Signed)
Patient agreement reviewed and signed on 10/23/2022.  [employer] issued to patient on 10/23/2022 by Brunetta Genera, CMA. Patient aware to not open the WatchPAT box until contacted with the activation PIN. Patient profile initialized in [employer] on 10/23/2022 by Brunetta Genera, CMA. Device serial number: [id number]

## 2022-11-04 ENCOUNTER — Other Ambulatory Visit: Payer: Self-pay | Admitting: Internal Medicine

## 2022-11-04 ENCOUNTER — Other Ambulatory Visit: Payer: Self-pay | Admitting: Cardiology

## 2022-11-11 ENCOUNTER — Encounter: Payer: Self-pay | Admitting: Family Medicine

## 2022-11-11 ENCOUNTER — Ambulatory Visit (INDEPENDENT_AMBULATORY_CARE_PROVIDER_SITE_OTHER): Payer: 59 | Admitting: Family Medicine

## 2022-11-11 VITALS — BP 138/68 | HR 74 | Temp 98.3°F | Resp 20 | Ht 68.0 in | Wt 193.0 lb

## 2022-11-11 DIAGNOSIS — M5441 Lumbago with sciatica, right side: Secondary | ICD-10-CM | POA: Diagnosis not present

## 2022-11-11 DIAGNOSIS — Z23 Encounter for immunization: Secondary | ICD-10-CM | POA: Diagnosis not present

## 2022-11-11 MED ORDER — PREDNISONE 10 MG (21) PO TBPK: ORAL_TABLET | ORAL | 0 refills | Status: DC

## 2022-11-11 MED ORDER — METHOCARBAMOL 500 MG PO TABS: 500.0000 mg | ORAL_TABLET | Freq: Three times a day (TID) | ORAL | 0 refills | Status: DC | PRN

## 2022-11-11 NOTE — Progress Notes (Signed)
Assessment & Plan:  1. Acute left-sided low back pain with right-sided sciatica Sciatica exercises provided for patient to complete at home.  May continue Tylenol arthritis 650 mg and lidocaine patches.  Discussed intermittent use of NSAIDs. - predniSONE (STERAPRED UNI-PAK 21 TAB) 10 MG (21) TBPK tablet; As directed x 6 days  Dispense: 21 tablet; Refill: 0 - methocarbamol (ROBAXIN) 500 MG tablet; Take 1 tablet (500 mg total) by mouth every 8 (eight) hours as needed.  Dispense: 30 tablet; Refill: 0  2. Encounter for immunization - Flu Vaccine Trivalent High Dose (Fluad)   Follow up plan: Return if symptoms worsen or fail to improve.  Deliah Boston, MSN, APRN, FNP-C  Subjective:  HPI: Susan Brady is a 77 y.o. female presenting on 11/11/2022 for Back Pain (Low back into right - over a week. No known injury )  Patient is accompanied by her daughter, who she is okay with being present.  Patient presents for presents evaluation of low back pain that radiates to the right thigh.  She describes the pain as "bad" and rates it 8-9/10. Symptoms have been present for  over a week .  She does have chronic back pain, but this is a different type of pain and she does not normally have the radiation.  Initial inciting event: none. Symptoms are worst: nighttime when trying to lie down. Alleviating factors identifiable by patient are recumbency. Exacerbating factors identifiable by patient are none. Treatments so far initiated by patient:  Tylenol arthritis 650 mg and Lidocaine patches offer some relief, but doesn't last very long  Previous lower back problems:  yes, chronic back pain .     ROS: Negative unless specifically indicated above in HPI.   Relevant past medical history reviewed and updated as indicated.   Allergies and medications reviewed and updated.   Current Outpatient Medications:    acetaminophen (TYLENOL) 500 MG tablet, Take 500-1,000 mg by mouth every 6 (six) hours as needed for  mild pain., Disp: , Rfl:    atorvastatin (LIPITOR) 20 MG tablet, TAKE 1 TABLET BY MOUTH DAILY, Disp: 90 tablet, Rfl: 1   cholecalciferol (VITAMIN D) 1000 UNITS tablet, Take 5,000 Units by mouth daily. , Disp: , Rfl:    flecainide (TAMBOCOR) 150 MG tablet, Take 1 tablet (150 mg total) by mouth 2 (two) times daily., Disp: 30 tablet, Rfl: 3   furosemide (LASIX) 20 MG tablet, TAKE 1 TABLET(20 MG) BY MOUTH TWICE DAILY, Disp: 180 tablet, Rfl: 0   gabapentin (NEURONTIN) 100 MG capsule, TAKE 3 TO 4 CAPSULES(300 TO 400 MG) BY MOUTH AT BEDTIME, Disp: 360 capsule, Rfl: 0   Garlic 1000 MG CAPS, Take 1,000 mg by mouth daily., Disp: , Rfl:    hydrALAZINE (APRESOLINE) 100 MG tablet, TAKE 1 TABLET(100 MG) BY MOUTH THREE TIMES DAILY, Disp: 270 tablet, Rfl: 2   loteprednol (LOTEMAX) 0.5 % ophthalmic suspension, Place 1 drop into both eyes 4 (four) times daily., Disp: , Rfl:    metoprolol succinate (TOPROL-XL) 25 MG 24 hr tablet, TAKE 1 TABLET(25 MG) BY MOUTH DAILY, Disp: 90 tablet, Rfl: 3   Omega-3 Fatty Acids (FISH OIL) 1000 MG CAPS, Take 1 capsule by mouth daily., Disp: , Rfl:    pantoprazole (PROTONIX) 40 MG tablet, TAKE 1 TABLET(40 MG) BY MOUTH DAILY, Disp: 90 tablet, Rfl: 2   potassium chloride SA (KLOR-CON M) 20 MEQ tablet, TAKE 1 TABLET(20 MEQ) BY MOUTH DAILY, Disp: 90 tablet, Rfl: 2   vitamin E 400 UNIT capsule, Take  1,000 Units by mouth daily. , Disp: , Rfl:   Allergies  Allergen Reactions   Lisinopril Anaphylaxis    cough   Trazodone And Nefazodone     hallucinations    Objective:   BP 138/68   Pulse 74   Temp 98.3 F (36.8 C)   Resp 20   Ht 5\' 8"  (1.727 m)   Wt 193 lb (87.5 kg)   BMI 29.35 kg/m    Physical Exam Vitals reviewed.  Constitutional:      General: She is not in acute distress.    Appearance: Normal appearance. She is not ill-appearing, toxic-appearing or diaphoretic.  HENT:     Head: Normocephalic and atraumatic.  Eyes:     General: No scleral icterus.       Right eye:  No discharge.        Left eye: No discharge.     Conjunctiva/sclera: Conjunctivae normal.  Cardiovascular:     Rate and Rhythm: Normal rate.  Pulmonary:     Effort: Pulmonary effort is normal. No respiratory distress.  Musculoskeletal:     Cervical back: Normal range of motion.     Lumbar back: Tenderness (left side) present. No bony tenderness. Positive right straight leg raise test. Negative left straight leg raise test.     Right hip: Normal.     Left hip: Normal.  Skin:    General: Skin is warm and dry.     Capillary Refill: Capillary refill takes less than 2 seconds.  Neurological:     General: No focal deficit present.     Mental Status: She is alert and oriented to person, place, and time. Mental status is at baseline.  Psychiatric:        Mood and Affect: Mood normal.        Behavior: Behavior normal.        Thought Content: Thought content normal.        Judgment: Judgment normal.

## 2022-11-20 ENCOUNTER — Encounter: Payer: Self-pay | Admitting: Adult Health

## 2022-11-20 ENCOUNTER — Ambulatory Visit (INDEPENDENT_AMBULATORY_CARE_PROVIDER_SITE_OTHER): Payer: 59 | Admitting: Adult Health

## 2022-11-20 VITALS — BP 111/66 | HR 54 | Ht 68.0 in | Wt 192.0 lb

## 2022-11-20 DIAGNOSIS — G4486 Cervicogenic headache: Secondary | ICD-10-CM

## 2022-11-20 DIAGNOSIS — M4802 Spinal stenosis, cervical region: Secondary | ICD-10-CM

## 2022-11-20 MED ORDER — GABAPENTIN 100 MG PO CAPS: ORAL_CAPSULE | ORAL | 3 refills | Status: DC

## 2022-11-20 NOTE — Patient Instructions (Signed)
Increase gabapentin nighttime dose to 300mg  (3 100mg  caps) and continue 100mg  in the morning   Please call with any difficulty tolerating or interest in further increasing dosage if needed    Follow up in 6 months or call earlier if needed

## 2022-11-20 NOTE — Progress Notes (Signed)
Referring:  Pincus Sanes, MD [address] [place],  [place] [address]  PCP: Pincus Sanes, MD   CC:  headaches  History provided from self, daughter  Follow-up visit:  Prior visit: 05/15/2022  HPI:   Susan Brady is a 77 y.o. female who is being followed for daily left-sided headaches and cervicalgia with radiculopathy. CTH with left-sided sinus disease and partially empty sella likely incidental finding as this was present on prior MRI in 2021 and recent eye exam normal. MR c-spine showed moderate to severe spinal stenosis at C3-4, C4-5, C5-6 and C6-7 with pinched nerve at left C7.   At prior visit, noted improvement of symptoms on gabapentin 100/200. Was seen by ortho who recommended PT, completed 1 session but did not continue due to improvement of symptoms.   Interval history:  Reports overall stable since prior visit, reports about 2 headaches per week, still affecting left side starting occipital region and radiating up to ear, use of gabapentin 100mg  AM and 200mg  PM, tolerating well. Denies any worsening of neck pain.       Headache days per month: 8 Headache free days per month: 22  Current Treatment: Abortive Tylenol  Preventative Gabapentin 100/200  Prior Therapies                                 Tylenol - helps Metoprolol Lisinopirl - cough   LABS: CBC    Component Value Date/Time   WBC 5.7 01/02/2022 0404   RBC 4.15 01/02/2022 0404   HGB 11.8 (L) 01/02/2022 0404   HCT 37.8 01/02/2022 0404   PLT 274 01/02/2022 0404   MCV 91.1 01/02/2022 0404   MCH 28.4 01/02/2022 0404   MCHC 31.2 01/02/2022 0404   RDW 13.5 01/02/2022 0404   LYMPHSABS 2.1 08/28/2020 1018   MONOABS 0.7 08/28/2020 1018   EOSABS 0.3 08/28/2020 1018   BASOSABS 0.0 08/28/2020 1018      Latest Ref Rng & Units 04/22/2022    8:49 AM 01/02/2022    4:04 AM 02/28/2021    9:43 AM  CMP  Glucose 70 - 99 mg/dL 92  952  91   BUN 6 - 23 mg/dL 12  13  11    Creatinine 0.40 - 1.20  mg/dL 8.41  3.24  4.01   Sodium 135 - 145 mEq/L 142  142  140   Potassium 3.5 - 5.1 mEq/L 3.9  3.8  3.9   Chloride 96 - 112 mEq/L 104  107  104   CO2 19 - 32 mEq/L 31  26  31    Calcium 8.4 - 10.5 mg/dL 9.6  9.1  9.2   Total Protein 6.0 - 8.3 g/dL 6.9   6.8   Total Bilirubin 0.2 - 1.2 mg/dL 0.5   0.6   Alkaline Phos 39 - 117 U/L 43   50   AST 0 - 37 U/L 18   17   ALT 0 - 35 U/L 9   11    ESR 17  IMAGING:  [facility] 01/02/22: 1. No acute intracranial CT findings. 2. Mild atrophy and small-vessel disease, new from 2016. 3. Mildly expanded empty sella. Nonspecific finding but can be seen with idiopathic intracranial hypertension. 4. Sinus disease.  Imaging independently reviewed on November 20, 2022   Current Outpatient Medications on File Prior to Visit  Medication Sig Dispense Refill   acetaminophen (TYLENOL) 500 MG tablet Take 500-1,000  mg by mouth every 6 (six) hours as needed for mild pain.     atorvastatin (LIPITOR) 20 MG tablet TAKE 1 TABLET BY MOUTH DAILY 90 tablet 1   cholecalciferol (VITAMIN D) 1000 UNITS tablet Take 5,000 Units by mouth daily.      flecainide (TAMBOCOR) 150 MG tablet Take 1 tablet (150 mg total) by mouth 2 (two) times daily. 30 tablet 3   furosemide (LASIX) 20 MG tablet TAKE 1 TABLET(20 MG) BY MOUTH TWICE DAILY 180 tablet 0   Garlic 1000 MG CAPS Take 1,000 mg by mouth daily.     hydrALAZINE (APRESOLINE) 100 MG tablet TAKE 1 TABLET(100 MG) BY MOUTH THREE TIMES DAILY 270 tablet 2   loteprednol (LOTEMAX) 0.5 % ophthalmic suspension Place 1 drop into both eyes 4 (four) times daily.     methocarbamol (ROBAXIN) 500 MG tablet Take 1 tablet (500 mg total) by mouth every 8 (eight) hours as needed. 30 tablet 0   metoprolol succinate (TOPROL-XL) 25 MG 24 hr tablet TAKE 1 TABLET(25 MG) BY MOUTH DAILY 90 tablet 3   Omega-3 Fatty Acids (FISH OIL) 1000 MG CAPS Take 1 capsule by mouth daily.     pantoprazole (PROTONIX) 40 MG tablet TAKE 1 TABLET(40 MG) BY MOUTH DAILY 90 tablet 2    potassium chloride SA (KLOR-CON M) 20 MEQ tablet TAKE 1 TABLET(20 MEQ) BY MOUTH DAILY 90 tablet 2   predniSONE (STERAPRED UNI-PAK 21 TAB) 10 MG (21) TBPK tablet As directed x 6 days 21 tablet 0   vitamin E 400 UNIT capsule Take 1,000 Units by mouth daily.      No current facility-administered medications on file prior to visit.     Allergies: Allergies  Allergen Reactions   Lisinopril Anaphylaxis    cough   Trazodone And Nefazodone     hallucinations    Family History: Migraine or other headaches in the family:  none Aneurysms in a first degree relative:  none Brain tumors in the family:  none Other neurological illness in the family:   none  Past Medical History: Past Medical History:  Diagnosis Date   Anemia    CHF (congestive heart failure) (HCC)    Diabetes mellitus without complication (HCC)    Gastric ulcer    Glaucoma    Hiatal hernia    Hyperlipidemia    Hypertension     Past Surgical History Past Surgical History:  Procedure Laterality Date   LEFT HEART CATH AND CORONARY ANGIOGRAPHY N/A 07/25/2016   Procedure: Left Heart Cath and Coronary Angiography;  Surgeon: Kathleene Hazel, MD;  Location: [facility];  Service: Cardiovascular;  Laterality: N/A;    Social History: Social History   Tobacco Use   Smoking status: Former   Smokeless tobacco: Never  Advertising account planner   Vaping status: Never Used  Substance Use Topics   Alcohol use: No    Alcohol/week: 0.0 standard drinks of alcohol   Drug use: No     ROS: Negative for fevers, chills. Positive for headaches. All other systems reviewed and negative unless stated otherwise in HPI.   Physical Exam:   Vital Signs: BP 111/66   Pulse (!) 54   Ht 5\' 8"  (1.727 m)   Wt 192 lb (87.1 kg)   BMI 29.19 kg/m  GENERAL: well appearing, very pleasant elderly African-American female, in no acute distress,alert SKIN:  Color, texture, turgor normal. No rashes or lesions HEAD:   Normocephalic/atraumatic. CV:  RRR RESP: Normal respiratory effort  NEUROLOGICAL:  Mental Status: Alert, oriented to person, place and time,Follows commands Cranial Nerves: PERRL, no papilledema visualized, visual fields intact to confrontation, extraocular movements intact, facial sensation intact, no facial droop or ptosis, hearing grossly intact, no dysarthria Motor: muscle strength 5/5 both upper and lower extremities,no drift, normal tone Reflexes: 2+ throughout Sensation: decreased sensation to light touch over LUE, otherwise sensation to light touch intact throughout Coordination: Finger-to- nose-finger intact bilaterally Gait: normal-based    IMPRESSION: 77 year old female with a history of HTN, HLD, glaucoma who presents for follow-up of left sided headaches possibly in setting of spinal stenosis vs occipital neuralgia. CTH with left-sided sinus disease and partially empty sella. Suspect empty sella is an incidental finding as it was present on prior MRI in 2021 and she has had a recent normal eye exam.  MRI C-spine showed moderately severe spinal stenosis at C3-C4 and C4-C5, severe spinal stenosis at C5-C6 and moderate spinal stenosis at C6-C7 and moderately severe left foraminal narrowing at C6-C7 with potential for C7 nerve root compression.  Referred to orthopedics and continued on gabapentin.  Improvement of symptoms since prior visit on gabapentin although continued headaches about 2 times per week    PLAN:  -Increase gabapentin from 100/200 to 100/300.  Advised to call after a couple of weeks if interested in further dosage increase or sooner if difficulty tolerating -Advised to contact orthopedics if neck pain with radiculopathy should reoccur    Follow-up in 6 months or call earlier if needed     I spent 20 minutes of face-to-face and non-face-to-face time with patient and daughter.  This included previsit chart review, lab review, study review, order entry,  electronic health record documentation, patient education and discussion regarding the above and answered all other questions to patient's satisfaction  Ihor Austin, [employer]  [facility] [address] [place], [place] [address]  Phone [phone] Fax [phone] Note: This document was prepared with digital dictation and possible smart phrase technology. Any transcriptional errors that result from this process are unintentional.

## 2022-12-10 ENCOUNTER — Other Ambulatory Visit: Payer: Self-pay | Admitting: Cardiology

## 2022-12-10 ENCOUNTER — Other Ambulatory Visit: Payer: Self-pay | Admitting: Internal Medicine

## 2022-12-19 ENCOUNTER — Other Ambulatory Visit: Payer: Self-pay | Admitting: Internal Medicine

## 2022-12-30 ENCOUNTER — Other Ambulatory Visit: Payer: Self-pay | Admitting: Internal Medicine

## 2022-12-30 DIAGNOSIS — Z1231 Encounter for screening mammogram for malignant neoplasm of breast: Secondary | ICD-10-CM

## 2023-01-07 ENCOUNTER — Telehealth: Payer: Self-pay

## 2023-01-07 NOTE — Telephone Encounter (Signed)
**Note De-Identified Susan Brady Obfuscation** Ordering provider: Edd Fabian, NP Associated diagnoses: Snoring-R06.83 and Somnolence-R40.0 WatchPAT PA obtained on 01/07/2023 by Elisha Mcgruder, Lorelle Formosa, LPN. Authorization: Per the [facility] Provider Portal: Prior Authorization/Notification is not required for the requested service(s). You are not required to submit a notification/prior authorization based on the information provided.  CPT Code: 21308 Decision ID #: [id number] The patient's daughter and DPR, [name] notified of PIN ([id number]) on 01/07/2023 [name] Notification Method: phone.  Phone note routed to covering staff for follow-up.

## 2023-01-08 ENCOUNTER — Encounter (INDEPENDENT_AMBULATORY_CARE_PROVIDER_SITE_OTHER): Payer: 59 | Admitting: Cardiology

## 2023-01-08 DIAGNOSIS — G4719 Other hypersomnia: Secondary | ICD-10-CM

## 2023-01-08 DIAGNOSIS — R0683 Snoring: Secondary | ICD-10-CM | POA: Diagnosis not present

## 2023-01-09 ENCOUNTER — Ambulatory Visit: Payer: 59 | Attending: General Practice

## 2023-01-09 DIAGNOSIS — R400 Somnolence: Secondary | ICD-10-CM

## 2023-01-09 NOTE — Procedures (Signed)
   SLEEP STUDY REPORT Patient Information Study Date: 01/08/2023 Patient Name: Susan Brady Patient ID: [id number] Birth Date: 05-Jul-1945 Age: 77 Gender: Female BMI: 29.4 (W=194 lb, H=5' 8'') Referring Physician: Edd Fabian, NP  TEST DESCRIPTION: Home sleep apnea testing was completed using the WatchPat, a Type 1 device, utilizing  peripheral arterial tonometry (PAT), chest movement, actigraphy, pulse oximetry, pulse rate, body position and snore.  AHI was calculated with apnea and hypopnea using valid sleep time as the denominator. RDI includes apneas,  hypopneas, and RERAs. The data acquired and the scoring of sleep and all associated events were performed in  accordance with the recommended standards and specifications as outlined in the AASM Manual for the Scoring of  Sleep and Associated Events 2.2.0 (2015).   FINDINGS: 1. No evidence of Obstructive Sleep Apnea with AHI 2.3/hr.  2. No Central Sleep Apnea. 3. Oxygen desaturations as low as 87%. 4. Minimal snoring was present. O2 sats were < 88% for 0 minutes. 5. Total sleep time was 6 hrs and 3 min. 6. 21.2% of total sleep time was spent in REM sleep.  7. Shortened sleep onset latency at 6 min.  8. Prolonged REM sleep onset latency at 118 min.  9. Total awakenings were 5.   DIAGNOSIS:  Normal study with no significant sleep disordered breathing.  RECOMMENDATIONS: 1. Normal study with no significant sleep disordered breathing.  2. Healthy sleep recommendations include: adequate nightly sleep (normal 7-9 hrs/night), avoidance of caffeine after  noon and alcohol near bedtime, and maintaining a sleep environment that is cool, dark and quiet.  3. Weight loss for overweight patients is recommended.   4. Snoring recommendations include: weight loss where appropriate, side sleeping, and avoidance of alcohol before  bed.  5. Operation of motor vehicle or dangerous equipment must be avoided when feeling drowsy, excessively  sleepy, or  mentally fatigued.   6. An ENT consultation which may be useful for specific causes of and possible treatment of bothersome snoring .   7. Weight loss may be of benefit in reducing the severity of snoring.   Signature: Armanda Magic, MD; [facility]; Diplomat, American Board of Sleep  Medicine Electronically Signed: 01/09/2023 1:49:17 PM

## 2023-01-10 ENCOUNTER — Telehealth: Payer: Self-pay | Admitting: *Deleted

## 2023-01-10 NOTE — Telephone Encounter (Signed)
-----   Message from Armanda Magic sent at 01/09/2023  1:50 PM EST ----- Please let patient know that sleep study showed no significant sleep apnea.

## 2023-01-10 NOTE — Telephone Encounter (Signed)
The patient has been notified of the result and verbalized understanding.  All questions (if any) were answered. Latrelle Dodrill, CMA 01/10/2023 5:27 PM    Pt is aware and agreeable to normal results.

## 2023-01-16 ENCOUNTER — Other Ambulatory Visit: Payer: Self-pay | Admitting: Internal Medicine

## 2023-01-24 NOTE — Telephone Encounter (Signed)
Sleep study was completed. See phone note from 01/10/23.

## 2023-01-28 NOTE — Patient Instructions (Addendum)
      Blood work was ordered.   Have an xray downstairs    Medications changes include :   prednisone x 5 days. Tramadol as needed.        Return in about 6 months (around 07/30/2023) for Physical Exam.

## 2023-01-28 NOTE — Progress Notes (Unsigned)
    Subjective:    Patient ID: Susan Brady, female    DOB: March 30, 1945, 77 y.o.   MRN: [id number]      HPI Susan Brady is here for No chief complaint on file. She is also due for follow-up.  Had a prior episode of left lower back pain with radiation to left side.  Pain was worse with certain positions, changes in positions.  I had prescribed prednisone 40 mg daily x 5 days.  Was seen here 10/2022 for the same - received steroid pak and methocarbamol.       Medications and allergies reviewed with patient and updated if appropriate.  Current Outpatient Medications on File Prior to Visit  Medication Sig Dispense Refill   acetaminophen (TYLENOL) 500 MG tablet Take 500-1,000 mg by mouth every 6 (six) hours as needed for mild pain.     atorvastatin (LIPITOR) 20 MG tablet TAKE 1 TABLET(20 MG) BY MOUTH DAILY 90 tablet 1   cholecalciferol (VITAMIN D) 1000 UNITS tablet Take 5,000 Units by mouth daily.      flecainide (TAMBOCOR) 150 MG tablet TAKE 1 TABLET(150 MG) BY MOUTH TWICE DAILY 30 tablet 3   furosemide (LASIX) 20 MG tablet TAKE 1 TABLET(20 MG) BY MOUTH TWICE DAILY 180 tablet 0   gabapentin (NEURONTIN) 100 MG capsule TAKE 3 TO 4 CAPSULES(300 TO 400 MG) BY MOUTH AT BEDTIME 120 capsule 0   Garlic 1000 MG CAPS Take 1,000 mg by mouth daily.     hydrALAZINE (APRESOLINE) 100 MG tablet TAKE 1 TABLET(100 MG) BY MOUTH THREE TIMES DAILY 270 tablet 2   loteprednol (LOTEMAX) 0.5 % ophthalmic suspension Place 1 drop into both eyes 4 (four) times daily.     methocarbamol (ROBAXIN) 500 MG tablet Take 1 tablet (500 mg total) by mouth every 8 (eight) hours as needed. 30 tablet 0   metoprolol succinate (TOPROL-XL) 25 MG 24 hr tablet TAKE 1 TABLET(25 MG) BY MOUTH DAILY 90 tablet 3   Omega-3 Fatty Acids (FISH OIL) 1000 MG CAPS Take 1 capsule by mouth daily.     pantoprazole (PROTONIX) 40 MG tablet TAKE 1 TABLET(40 MG) BY MOUTH DAILY 90 tablet 2   potassium chloride SA (KLOR-CON M) 20 MEQ tablet TAKE 1 TABLET(20  MEQ) BY MOUTH DAILY 90 tablet 2   predniSONE (STERAPRED UNI-PAK 21 TAB) 10 MG (21) TBPK tablet As directed x 6 days 21 tablet 0   vitamin E 400 UNIT capsule Take 1,000 Units by mouth daily.      No current facility-administered medications on file prior to visit.    Review of Systems     Objective:  There were no vitals filed for this visit. BP Readings from Last 3 Encounters:  11/20/22 111/66  11/11/22 138/68  10/23/22 114/70   Wt Readings from Last 3 Encounters:  11/20/22 192 lb (87.1 kg)  11/11/22 193 lb (87.5 kg)  10/23/22 193 lb 6.4 oz (87.7 kg)   There is no height or weight on file to calculate BMI.    Physical Exam         Assessment & Plan:    See Problem List for Assessment and Plan of chronic medical problems.

## 2023-01-29 ENCOUNTER — Encounter: Payer: Self-pay | Admitting: Internal Medicine

## 2023-01-29 ENCOUNTER — Ambulatory Visit (INDEPENDENT_AMBULATORY_CARE_PROVIDER_SITE_OTHER): Payer: 59

## 2023-01-29 ENCOUNTER — Ambulatory Visit: Payer: 59 | Admitting: Internal Medicine

## 2023-01-29 VITALS — BP 130/80 | HR 102 | Temp 98.5°F | Ht 68.0 in | Wt 192.0 lb

## 2023-01-29 DIAGNOSIS — G8929 Other chronic pain: Secondary | ICD-10-CM | POA: Diagnosis not present

## 2023-01-29 DIAGNOSIS — K219 Gastro-esophageal reflux disease without esophagitis: Secondary | ICD-10-CM | POA: Diagnosis not present

## 2023-01-29 DIAGNOSIS — I10 Essential (primary) hypertension: Secondary | ICD-10-CM | POA: Diagnosis not present

## 2023-01-29 DIAGNOSIS — I5032 Chronic diastolic (congestive) heart failure: Secondary | ICD-10-CM

## 2023-01-29 DIAGNOSIS — M5416 Radiculopathy, lumbar region: Secondary | ICD-10-CM

## 2023-01-29 DIAGNOSIS — M4316 Spondylolisthesis, lumbar region: Secondary | ICD-10-CM | POA: Diagnosis not present

## 2023-01-29 DIAGNOSIS — E7849 Other hyperlipidemia: Secondary | ICD-10-CM | POA: Diagnosis not present

## 2023-01-29 DIAGNOSIS — E119 Type 2 diabetes mellitus without complications: Secondary | ICD-10-CM | POA: Diagnosis not present

## 2023-01-29 DIAGNOSIS — M545 Low back pain, unspecified: Secondary | ICD-10-CM | POA: Diagnosis not present

## 2023-01-29 DIAGNOSIS — M4726 Other spondylosis with radiculopathy, lumbar region: Secondary | ICD-10-CM | POA: Diagnosis not present

## 2023-01-29 LAB — COMPREHENSIVE METABOLIC PANEL
ALT: 9 U/L (ref 0–35)
AST: 15 U/L (ref 0–37)
Albumin: 4 g/dL (ref 3.5–5.2)
Alkaline Phosphatase: 51 U/L (ref 39–117)
BUN: 12 mg/dL (ref 6–23)
CO2: 32 meq/L (ref 19–32)
Calcium: 9.4 mg/dL (ref 8.4–10.5)
Chloride: 104 meq/L (ref 96–112)
Creatinine, Ser: 0.77 mg/dL (ref 0.40–1.20)
GFR: 73.95 mL/min (ref >60.00)
Glucose, Bld: 102 mg/dL — ABNORMAL HIGH (ref 70–99)
Potassium: 3.9 meq/L (ref 3.5–5.1)
Sodium: 142 meq/L (ref 135–145)
Total Bilirubin: 0.6 mg/dL (ref 0.2–1.2)
Total Protein: 6.9 g/dL (ref 6.0–8.3)

## 2023-01-29 LAB — LIPID PANEL
Cholesterol: 148 mg/dL (ref 0–200)
HDL: 71.7 mg/dL (ref >39.00)
LDL Cholesterol: 64 mg/dL (ref 0–99)
NonHDL: 76.44
Total CHOL/HDL Ratio: 2
Triglycerides: 64 mg/dL (ref 0.0–149.0)
VLDL: 12.8 mg/dL (ref 0.0–40.0)

## 2023-01-29 LAB — HEMOGLOBIN A1C: Hgb A1c MFr Bld: 5.9 % (ref 4.6–6.5)

## 2023-01-29 LAB — CBC WITH DIFFERENTIAL/PLATELET
Basophils Absolute: 0 10*3/uL (ref 0.0–0.1)
Basophils Relative: 0.5 % (ref 0.0–3.0)
Eosinophils Absolute: 0.1 10*3/uL (ref 0.0–0.7)
Eosinophils Relative: 2.6 % (ref 0.0–5.0)
HCT: 38.4 % (ref 36.0–46.0)
Hemoglobin: 12.3 g/dL (ref 12.0–15.0)
Lymphocytes Relative: 41 % (ref 12.0–46.0)
Lymphs Abs: 2.1 10*3/uL (ref 0.7–4.0)
MCHC: 32.1 g/dL (ref 30.0–36.0)
MCV: 90.3 fL (ref 78.0–100.0)
Monocytes Absolute: 0.7 10*3/uL (ref 0.1–1.0)
Monocytes Relative: 13.7 % — ABNORMAL HIGH (ref 3.0–12.0)
Neutro Abs: 2.2 10*3/uL (ref 1.4–7.7)
Neutrophils Relative %: 42.2 % — ABNORMAL LOW (ref 43.0–77.0)
Platelets: 307 10*3/uL (ref 150.0–400.0)
RBC: 4.25 Mil/uL (ref 3.87–5.11)
RDW: 13.3 % (ref 11.5–15.5)
WBC: 5.1 10*3/uL (ref 4.0–10.5)

## 2023-01-29 MED ORDER — TRAMADOL HCL 50 MG PO TABS: 50.0000 mg | ORAL_TABLET | Freq: Three times a day (TID) | ORAL | 0 refills | Status: AC | PRN

## 2023-01-29 MED ORDER — PREDNISONE 20 MG PO TABS: 40.0000 mg | ORAL_TABLET | Freq: Every day | ORAL | 0 refills | Status: AC

## 2023-01-29 NOTE — Assessment & Plan Note (Signed)
Chronic °Regular exercise and healthy diet encouraged °Check lipid panel  °Continue atorvastatin 20 mg daily °

## 2023-01-29 NOTE — Assessment & Plan Note (Addendum)
Acute on chronic Has chronic lower back pain and has had recurrent lumbar radiculopathy both on the right side and left side Currently having significant pain from lower back and pain down right leg Start prednisone 40 mg daily x 5 days Can take methocarbamol she has at home Can take Tylenol for mild-moderate pain Will prescribe tramadol 50 mg every 8 hours as needed for severe pain-discussed possible side effects.  This can be increased if needed X-ray of lumbar spine today Deferred physical therapy Deferred referral to a specialist

## 2023-01-29 NOTE — Assessment & Plan Note (Signed)
Chronic GERD controlled Continue pantoprazole 40 mg daily 

## 2023-01-29 NOTE — Assessment & Plan Note (Signed)
Chronic With hyperlipidemia  Lab Results  Component Value Date   HGBA1C 5.9 01/29/2023   Sugars well controlled Check A1c Continue diet control Stressed regular exercise, diabetic diet

## 2023-01-29 NOTE — Assessment & Plan Note (Signed)
Chronic Blood pressure well controlled CMP, CBC Continue hydralazine 100 mg 3 times daily, metoprolol XL 25 mg daily

## 2023-01-29 NOTE — Assessment & Plan Note (Signed)
Chronic Appears to be euvolemic Continue furosemide 20 mg twice daily CMP

## 2023-01-30 ENCOUNTER — Ambulatory Visit
Admission: RE | Admit: 2023-01-30 | Discharge: 2023-01-30 | Disposition: A | Discharge status: Home / Self Care | Payer: 59 | Source: Ambulatory Visit | Attending: Internal Medicine | Admitting: Internal Medicine

## 2023-01-30 DIAGNOSIS — Z1231 Encounter for screening mammogram for malignant neoplasm of breast: Secondary | ICD-10-CM

## 2023-02-05 ENCOUNTER — Other Ambulatory Visit: Payer: Self-pay | Admitting: Internal Medicine

## 2023-02-25 ENCOUNTER — Other Ambulatory Visit: Payer: Self-pay | Admitting: Internal Medicine

## 2023-04-14 ENCOUNTER — Other Ambulatory Visit: Payer: Self-pay | Admitting: Cardiology

## 2023-04-16 DIAGNOSIS — H2513 Age-related nuclear cataract, bilateral: Secondary | ICD-10-CM | POA: Diagnosis not present

## 2023-04-16 DIAGNOSIS — H5203 Hypermetropia, bilateral: Secondary | ICD-10-CM | POA: Diagnosis not present

## 2023-04-16 DIAGNOSIS — H524 Presbyopia: Secondary | ICD-10-CM | POA: Diagnosis not present

## 2023-04-16 DIAGNOSIS — E119 Type 2 diabetes mellitus without complications: Secondary | ICD-10-CM | POA: Diagnosis not present

## 2023-04-16 DIAGNOSIS — H52223 Regular astigmatism, bilateral: Secondary | ICD-10-CM | POA: Diagnosis not present

## 2023-04-16 DIAGNOSIS — H35033 Hypertensive retinopathy, bilateral: Secondary | ICD-10-CM | POA: Diagnosis not present

## 2023-04-16 LAB — HM DIABETES EYE EXAM

## 2023-05-07 ENCOUNTER — Other Ambulatory Visit: Payer: Self-pay | Admitting: Internal Medicine

## 2023-05-17 ENCOUNTER — Other Ambulatory Visit: Payer: Self-pay | Admitting: Internal Medicine

## 2023-05-23 ENCOUNTER — Telehealth: Admitting: Nurse Practitioner

## 2023-05-23 ENCOUNTER — Ambulatory Visit: Payer: Self-pay | Admitting: *Deleted

## 2023-05-23 DIAGNOSIS — M5416 Radiculopathy, lumbar region: Secondary | ICD-10-CM | POA: Diagnosis not present

## 2023-05-23 MED ORDER — PREDNISONE 20 MG PO TABS: 40.0000 mg | ORAL_TABLET | Freq: Every day | ORAL | 0 refills | Status: DC

## 2023-05-23 MED ORDER — TRAMADOL HCL 50 MG PO TABS: 50.0000 mg | ORAL_TABLET | Freq: Three times a day (TID) | ORAL | 0 refills | Status: AC | PRN

## 2023-05-23 NOTE — Progress Notes (Signed)
   Established Patient Office Visit  An audio/visual tele-health visit was completed today for this patient. I connected with  Susan Brady on 05/23/23 utilizing audio/visual technology and verified that I am speaking with the correct person using two identifiers. The patient was located at their home, and I was located at the office of [facility] during the encounter. I discussed the limitations of evaluation and management by telemedicine. The patient expressed understanding and agreed to proceed.     Subjective   Patient ID: Susan Brady, female    DOB: 14-Mar-1945  Age: 78 y.o. MRN: [id number]  Chief Complaint  Patient presents with   Back Pain   Patient arrives accompanied by her family member for virtual visit this afternoon. She has been having acute on chronic low back pain that radiates down her right leg for the past 3 weeks.  10/10 in intensity described as aching, dull, burning sensation.  Has been taking gabapentin and Tylenol as needed but acute pain has been hard to manage at this time.  She had a flareup of acute on chronic back pain in 10/2022 in 01/2023.  At those times treatment with prednisone and tramadol help to ease the pain.  She denies any right leg weakness or sensory changes she also denies any new bowel or bladder incontinence.  Her family member reports that patient has seen specialist as well as physical therapy in the past without much improvement in symptom management.     ROS: see HPI    Objective:     There were no vitals taken for this visit.   Physical Exam Comprehensive physical exam not completed today as office visit was conducted remotely.  Patient appears fairly well over video, she is able to speak to me in complete sentences she appears to be oriented, she is able to answer questions appropriately.  No results found for any visits on 05/23/23.    The ASCVD Risk score (Arnett DK, et al., 2019) failed to calculate  for the following reasons:   Risk score cannot be calculated because patient has a medical history suggesting prior/existing ASCVD    Assessment & Plan:   Problem List Items Addressed This Visit       Nervous and Auditory   Lumbar radiculopathy, right - Primary   Acute on chronic low back pain that radiates down her right leg No red flag symptoms such as sensory changes, weakness, bowel or bladder incontinence. Will prescribe prednisone 40 mg x 5 days, we discussed risks and side effects as well as to avoid any NSAID use in combination with prednisone.  We also discussed when to call the office or proceed to the Emergency Department due to potential side effects of prednisone. Will prescribe tramadol 50 mg every 8 hours as needed for pain, patient denies history of seizures.  We discussed potential side effects and risks associate with tramadol as well. I offered referral to physical therapy or orthopedist, but patient declined. Educated her on signs or symptoms that would require emergency department evaluation.  She reports her understanding.      Relevant Medications   predniSONE (DELTASONE) 20 MG tablet   traMADol (ULTRAM) 50 MG tablet    Return if symptoms worsen or fail to improve.    Elenore Paddy, NP

## 2023-05-23 NOTE — Assessment & Plan Note (Signed)
 Acute on chronic low back pain that radiates down her right leg No red flag symptoms such as sensory changes, weakness, bowel or bladder incontinence. Will prescribe prednisone 40 mg x 5 days, we discussed risks and side effects as well as to avoid any NSAID use in combination with prednisone.  We also discussed when to call the office or proceed to the Emergency Department due to potential side effects of prednisone. Will prescribe tramadol 50 mg every 8 hours as needed for pain, patient denies history of seizures.  We discussed potential side effects and risks associate with tramadol as well. I offered referral to physical therapy or orthopedist, but patient declined. Educated her on signs or symptoms that would require emergency department evaluation.  She reports her understanding.

## 2023-05-23 NOTE — Telephone Encounter (Signed)
 Daughter on DPR calling with report of sx.  Chief Complaint: low back pain right leg pain worsening  Symptoms: low back pain , right leg burning tingling from leg to foot. Chronic issue but pain worsening now.  Frequency: 2 weeks  Pertinent Negatives: Patient denies fever no weakness can walk with assist walker.  Disposition: [] ED /[] Urgent Care (no appt availability in office) / [x] Appointment(In office/virtual)/ []  [facility]/ [] Home Care/ [] Refused Recommended Disposition /[] [facility]/ []  Follow-up with PCP Additional Notes:   No available appt with PCP as requested. Patient daughter requesting VV. Scheduled today with other provider in office.  Copied from CRM [id number]. Topic: Appointments - Appointment Scheduling >> May 23, 2023 10:24 AM Emylou G wrote: Pain lower back and right leg radiating down// like sciatica Reason for Disposition  [1] SEVERE back pain (e.g., excruciating, unable to do any normal activities) AND [2] not improved 2 hours after pain medicine  Answer Assessment - Initial Assessment Questions 1. ONSET: "When did the pain begin?"      2 weeks  2. LOCATION: "Where does it hurt?" (upper, mid or lower back)     Low back radiates down right leg 3. SEVERITY: "How bad is the pain?"  (e.g., Scale 1-10; mild, moderate, or severe)   - MILD (1-3): Doesn't interfere with normal activities.    - MODERATE (4-7): Interferes with normal activities or awakens from sleep.    - SEVERE (8-10): Excruciating pain, unable to do any normal activities.     15/10  4. PATTERN: "Is the pain constant?" (e.g., yes, no; constant, intermittent)      Constant  5. RADIATION: "Does the pain shoot into your legs or somewhere else?"     Right  6. CAUSE:  "What do you think is causing the back pain?"      Not sure hx of back pain 7. BACK OVERUSE:  "Any recent lifting of heavy objects, strenuous work or exercise?"     na 8. MEDICINES: "What have you taken so far for the  pain?" (e.g., nothing, acetaminophen, NSAIDS)     Neurontin  9. NEUROLOGIC SYMPTOMS: "Do you have any weakness, numbness, or problems with bowel/bladder control?"     Tingling right leg to foot at times 10. OTHER SYMPTOMS: "Do you have any other symptoms?" (e.g., fever, abdomen pain, burning with urination, blood in urine)       Back pain , right leg pain burning tingling  11. PREGNANCY: "Is there any chance you are pregnant?" "When was your last menstrual period?"       na  Protocols used: Back Pain-A-AH

## 2023-06-05 ENCOUNTER — Encounter: Payer: Self-pay | Admitting: Adult Health

## 2023-06-05 ENCOUNTER — Ambulatory Visit: Payer: 59 | Admitting: Adult Health

## 2023-06-05 VITALS — BP 128/69 | HR 75 | Ht 67.0 in | Wt 197.0 lb

## 2023-06-05 DIAGNOSIS — M4802 Spinal stenosis, cervical region: Secondary | ICD-10-CM

## 2023-06-05 DIAGNOSIS — M5481 Occipital neuralgia: Secondary | ICD-10-CM | POA: Diagnosis not present

## 2023-06-05 DIAGNOSIS — G4486 Cervicogenic headache: Secondary | ICD-10-CM

## 2023-06-05 MED ORDER — GABAPENTIN 100 MG PO CAPS: ORAL_CAPSULE | ORAL | 11 refills | Status: DC

## 2023-06-05 NOTE — Patient Instructions (Addendum)
 Your Plan:  Continue gabapentin 200mg  AM and increase bedtime dose to 400mg   Please call with any worsening headaches      Follow up in 6 months or call earlier if needed     Thank you for coming to see us  at [facility]. I hope we have been able to provide you high quality care today.  You may receive a patient satisfaction survey over the next few weeks. We would appreciate your feedback and comments so that we may continue to improve ourselves and the health of our patients.

## 2023-06-05 NOTE — Progress Notes (Signed)
 Referring:  Pincus Sanes, MD [address] [place],  [place] [address]  PCP: Pincus Sanes, MD   CC:  headaches  History provided from self, daughter  Follow-up visit:  Prior visit: 05/15/2022  HPI:   Susan Brady is a 78 y.o. female who is being followed for daily left-sided headaches and cervicalgia with radiculopathy. CTH with left-sided sinus disease and partially empty sella likely incidental finding as this was present on prior MRI in 2021 and recent eye exam normal. MR c-spine showed moderate to severe spinal stenosis at C3-4, C4-5, C5-6 and C6-7 with pinched nerve at left C7.   At prior visit, noted improvement of neck pain but continued headaches about 2 times per week therefore increased gabapentin to 100/300.   Interval history:  She is accompanied today by her daughter.  Has been experiencing increase headaches over the past 2 to 3 weeks, occurring about 1-2 times per week.  Prior to recent worsening, she was really not experiencing any headaches.  She does admit to having issues with allergies more recently.  She continues on gabapentin but daughter reports currently taking 200/300, reports increased by PCP after flareup of right-sided sciatica.  Recently had another flareup and recently finished steroids with some improvement.  Does have occasional cervicalgia but daughter plans on looking into different pillows as she believes her unsupportive pillow possibly contributing.     Headache days per month: 4-8 Headache free days per month: 22-26  Current Treatment: Abortive Tylenol  Preventative Gabapentin 200/300  Prior Therapies                                 Tylenol - helps Metoprolol Lisinopirl - cough Gabapentin   LABS: CBC    Component Value Date/Time   WBC 5.1 01/29/2023 0915   RBC 4.25 01/29/2023 0915   HGB 12.3 01/29/2023 0915   HCT 38.4 01/29/2023 0915   PLT 307.0 01/29/2023 0915   MCV 90.3 01/29/2023 0915   MCH 28.4 01/02/2022  0404   MCHC 32.1 01/29/2023 0915   RDW 13.3 01/29/2023 0915   LYMPHSABS 2.1 01/29/2023 0915   MONOABS 0.7 01/29/2023 0915   EOSABS 0.1 01/29/2023 0915   BASOSABS 0.0 01/29/2023 0915      Latest Ref Rng & Units 01/29/2023    9:15 AM 04/22/2022    8:49 AM 01/02/2022    4:04 AM  CMP  Glucose 70 - 99 mg/dL 578  92  469   BUN 6 - 23 mg/dL 12  12  13    Creatinine 0.40 - 1.20 mg/dL 6.29  5.28  4.13   Sodium 135 - 145 mEq/L 142  142  142   Potassium 3.5 - 5.1 mEq/L 3.9  3.9  3.8   Chloride 96 - 112 mEq/L 104  104  107   CO2 19 - 32 mEq/L 32  31  26   Calcium 8.4 - 10.5 mg/dL 9.4  9.6  9.1   Total Protein 6.0 - 8.3 g/dL 6.9  6.9    Total Bilirubin 0.2 - 1.2 mg/dL 0.6  0.5    Alkaline Phos 39 - 117 U/L 51  43    AST 0 - 37 U/L 15  18    ALT 0 - 35 U/L 9  9     ESR 17  IMAGING:  CT[facility] 01/02/22: 1. No acute intracranial CT findings. 2. Mild atrophy and small-vessel disease, new  from 2016. 3. Mildly expanded empty sella. Nonspecific finding but can be seen with idiopathic intracranial hypertension. 4. Sinus disease.  Imaging independently reviewed on June 05, 2023   Current Outpatient Medications on File Prior to Visit  Medication Sig Dispense Refill   acetaminophen (TYLENOL) 500 MG tablet Take 500-1,000 mg by mouth every 6 (six) hours as needed for mild pain.     atorvastatin (LIPITOR) 20 MG tablet TAKE 1 TABLET(20 MG) BY MOUTH DAILY 90 tablet 1   cholecalciferol (VITAMIN D) 1000 UNITS tablet Take 5,000 Units by mouth daily.      flecainide (TAMBOCOR) 150 MG tablet TAKE 1 TABLET(150 MG) BY MOUTH TWICE DAILY 180 tablet 0   furosemide (LASIX) 20 MG tablet TAKE 1 TABLET(20 MG) BY MOUTH TWICE DAILY 180 tablet 0   Garlic 1000 MG CAPS Take 1,000 mg by mouth daily.     hydrALAZINE (APRESOLINE) 100 MG tablet TAKE 1 TABLET(100 MG) BY MOUTH THREE TIMES DAILY 270 tablet 2   loteprednol (LOTEMAX) 0.5 % ophthalmic suspension Place 1 drop into both eyes 4 (four) times daily.     methocarbamol  (ROBAXIN) 500 MG tablet Take 1 tablet (500 mg total) by mouth every 8 (eight) hours as needed. 30 tablet 0   metoprolol succinate (TOPROL-XL) 25 MG 24 hr tablet TAKE 1 TABLET(25 MG) BY MOUTH DAILY 90 tablet 3   Omega-3 Fatty Acids (FISH OIL) 1000 MG CAPS Take 1 capsule by mouth daily.     pantoprazole (PROTONIX) 40 MG tablet TAKE 1 TABLET(40 MG) BY MOUTH DAILY 90 tablet 2   potassium chloride SA (KLOR-CON M) 20 MEQ tablet TAKE 1 TABLET(20 MEQ) BY MOUTH DAILY 90 tablet 2   predniSONE (DELTASONE) 20 MG tablet Take 2 tablets (40 mg total) by mouth daily with breakfast. 10 tablet 0   vitamin E 400 UNIT capsule Take 1,000 Units by mouth daily.      No current facility-administered medications on file prior to visit.     Allergies: Allergies  Allergen Reactions   Lisinopril Anaphylaxis    cough   Trazodone And Nefazodone     hallucinations    Family History: Migraine or other headaches in the family:  none Aneurysms in a first degree relative:  none Brain tumors in the family:  none Other neurological illness in the family:   none  Past Medical History: Past Medical History:  Diagnosis Date   Anemia    CHF (congestive heart failure) (HCC)    Diabetes mellitus without complication (HCC)    Gastric ulcer    Glaucoma    Hiatal hernia    Hyperlipidemia    Hypertension     Past Surgical History Past Surgical History:  Procedure Laterality Date   LEFT HEART CATH AND CORONARY ANGIOGRAPHY N/A 07/25/2016   Procedure: Left Heart Cath and Coronary Angiography;  Surgeon: Kathleene Hazel, MD;  Location: [facility] INVASIVE CV [facility];  Service: Cardiovascular;  Laterality: N/A;    Social History: Social History   Tobacco Use   Smoking status: Former   Smokeless tobacco: Never  Advertising account planner   Vaping status: Never Used  Substance Use Topics   Alcohol use: No    Alcohol/week: 0.0 standard drinks of alcohol   Drug use: No     ROS: Negative for fevers, chills. Positive for headaches.  All other systems reviewed and negative unless stated otherwise in HPI.   Physical Exam:   Vital Signs: BP 128/69   Pulse 75   Ht 5\' 7"  (  1.702 m)   Wt 197 lb (89.4 kg)   BMI 30.85 kg/m  GENERAL: well appearing, very pleasant elderly African-American female, in no acute distress,alert SKIN:  Color, texture, turgor normal. No rashes or lesions HEAD:  Normocephalic/atraumatic. CV:  RRR RESP: Normal respiratory effort. MSK: decreased LLE ROM d/t pain  NEUROLOGICAL: Mental Status: Alert, oriented to person, place and time,Follows commands Cranial Nerves: PERRL, no papilledema visualized, visual fields intact to confrontation, extraocular movements intact, facial sensation intact, no facial droop or ptosis, hearing grossly intact, no dysarthria Motor: muscle strength 5/5 both upper and lower extremities,no drift, normal tone Reflexes: 2+ throughout Sensation: decreased sensation to light touch over LUE, otherwise sensation to light touch intact throughout Coordination: Finger-to- nose-finger intact bilaterally Gait: normal-based with mild favoring of LLE    IMPRESSION: 78 year old female with a history of HTN, HLD, glaucoma who presents for follow-up of left sided headaches possibly in setting of spinal stenosis vs occipital neuralgia. CTH with left-sided sinus disease and partially empty sella. Suspect empty sella is an incidental finding as it was present on prior MRI in 2021 and she has had a recent normal eye exam.  MRI C-spine showed moderately severe spinal stenosis at C3-C4 and C4-C5, severe spinal stenosis at C5-C6 and moderate spinal stenosis at C6-C7 and moderately severe left foraminal narrowing at C6-C7 with potential for C7 nerve root compression.  Referred to orthopedics and continued on gabapentin.  Neck pain gradually improved.  Headaches also improved on higher gabapentin dosage although has been experiencing more frequent headaches over the past 2 to 3 weeks possibly in  setting of allergies and weather changes.  She has also been struggling with sciatica flareup.    PLAN:  -Increase gabapentin from 200/300 to 200/400 for both headaches and sciatica - Continue to follow with PCP for monitoring of sciatica, consider evaluation with orthopedics/neurosurgery if symptoms persist      Follow-up in 6 months or call earlier if needed     I spent 25 minutes of face-to-face and non-face-to-face time with patient and daughter.  This included previsit chart review, lab review, study review, order entry, electronic health record documentation, patient education and discussion regarding the above and answered all other questions to patient's satisfaction  Johny Nap, [place] [facility]  [facility] [address] [place], [place] [address]  Phone [phone] Fax [phone] Note: This document was prepared with digital dictation and possible smart phrase technology. Any transcriptional errors that result from this process are unintentional.

## 2023-06-19 ENCOUNTER — Telehealth: Payer: Self-pay | Admitting: Internal Medicine

## 2023-06-19 NOTE — Telephone Encounter (Signed)
 Copied from CRM [id number]. Topic: Clinical - Prescription Issue >> Jun 19, 2023  9:08 AM Albertha Alosa wrote: Reason for CRM: Patients daughter Arizona La, called in regarding gabapentin  (NEURONTIN ) 100 MG capsule  -- stating they need doctor notes for the increase to be sent over to the pharmacy  [id number] - [place], [place] - [address] AT [facility] OF [address] [place] [address] Phone: [phone] Fax: [phone]

## 2023-06-19 NOTE — Telephone Encounter (Signed)
 Message left for Arizona La to return call to clinic.  Script increase was sent in by Camilo Cella with [facility] on 06/05/23.  She should contact pharmacy for refill if she hasn't picked it up.  We will not need to send in a new script as this was handled by Neuro.

## 2023-06-30 ENCOUNTER — Other Ambulatory Visit: Payer: Self-pay | Admitting: Internal Medicine

## 2023-07-07 ENCOUNTER — Ambulatory Visit (INDEPENDENT_AMBULATORY_CARE_PROVIDER_SITE_OTHER)

## 2023-07-07 VITALS — Ht 67.0 in | Wt 197.0 lb

## 2023-07-07 DIAGNOSIS — Z0000 Encounter for general adult medical examination without abnormal findings: Secondary | ICD-10-CM

## 2023-07-07 DIAGNOSIS — E119 Type 2 diabetes mellitus without complications: Secondary | ICD-10-CM

## 2023-07-07 NOTE — Progress Notes (Signed)
 Subjective:   Susan Brady is a 78 y.o. who presents for a Medicare Wellness preventive visit.  As a reminder, Annual Wellness Visits don't include a physical exam, and some assessments may be limited, especially if this visit is performed virtually. We may recommend an in-person follow-up visit with your provider if needed.  Visit Complete: Virtual I connected with  King Penning on 07/07/23 by a audio enabled telemedicine application and verified that I am speaking with the correct person using two identifiers.  Patient Location: Home  Provider Location: Home Office  I discussed the limitations of evaluation and management by telemedicine. The patient expressed understanding and agreed to proceed.  Vital Signs: Because this visit was a virtual/telehealth visit, some criteria may be missing or patient reported. Any vitals not documented were not able to be obtained and vitals that have been documented are patient reported.  VideoDeclined- This patient declined Librarian, academic. Therefore the visit was completed with audio only.  Persons Participating in Visit: Patient.  AWV Questionnaire: No: Patient Medicare AWV questionnaire was not completed prior to this visit.  Cardiac Risk Factors include: advanced age (>65men, >21 women);hypertension;dyslipidemia     Objective:     Today's Vitals   07/07/23 1600  Weight: 197 lb (89.4 kg)  Height: 5\' 7"  (1.702 m)  PainSc: 5    Body mass index is 30.85 kg/m.     07/07/2023    4:10 PM 03/07/2022    3:17 PM 01/02/2022    3:52 AM 02/07/2021    3:43 PM 01/03/2020    1:58 PM 08/01/2019    8:56 PM 12/20/2016    3:36 PM  Advanced Directives  Does Patient Have a Medical Advance Directive? No No No No Yes No No  Does patient want to make changes to medical advance directive?     No - Patient declined    Would patient like information on creating a medical advance directive?  No - Patient declined No -  Patient declined No - Patient declined  No - Patient declined Yes (ED - Information included in AVS)    Current Medications (verified) Outpatient Encounter Medications as of 07/07/2023  Medication Sig   acetaminophen  (TYLENOL ) 500 MG tablet Take 500-1,000 mg by mouth every 6 (six) hours as needed for mild pain.   atorvastatin  (LIPITOR) 20 MG tablet TAKE 1 TABLET BY MOUTH DAILY   cholecalciferol (VITAMIN D) 1000 UNITS tablet Take 5,000 Units by mouth daily.    flecainide  (TAMBOCOR ) 150 MG tablet TAKE 1 TABLET(150 MG) BY MOUTH TWICE DAILY   furosemide  (LASIX ) 20 MG tablet TAKE 1 TABLET(20 MG) BY MOUTH TWICE DAILY   gabapentin  (NEURONTIN ) 100 MG capsule Take 2 capsules (200 mg total) by mouth every morning AND 4 capsules (400 mg total) at bedtime.   Garlic 1000 MG CAPS Take 1,000 mg by mouth daily.   hydrALAZINE  (APRESOLINE ) 100 MG tablet TAKE 1 TABLET(100 MG) BY MOUTH THREE TIMES DAILY   loteprednol (LOTEMAX) 0.5 % ophthalmic suspension Place 1 drop into both eyes 4 (four) times daily.   methocarbamol  (ROBAXIN ) 500 MG tablet Take 1 tablet (500 mg total) by mouth every 8 (eight) hours as needed.   metoprolol  succinate (TOPROL -XL) 25 MG 24 hr tablet TAKE 1 TABLET(25 MG) BY MOUTH DAILY   Omega-3 Fatty Acids (FISH OIL) 1000 MG CAPS Take 1 capsule by mouth daily.   pantoprazole  (PROTONIX ) 40 MG tablet TAKE 1 TABLET(40 MG) BY MOUTH DAILY   potassium chloride  SA (KLOR-CON   M) 20 MEQ tablet TAKE 1 TABLET(20 MEQ) BY MOUTH DAILY   predniSONE  (DELTASONE ) 20 MG tablet Take 2 tablets (40 mg total) by mouth daily with breakfast.   vitamin E 400 UNIT capsule Take 1,000 Units by mouth daily.    No facility-administered encounter medications on file as of 07/07/2023.    Allergies (verified) Lisinopril and Trazodone and nefazodone   History: Past Medical History:  Diagnosis Date   Anemia    CHF (congestive heart failure) (HCC)    Diabetes mellitus without complication (HCC)    Gastric ulcer    Glaucoma     Hiatal hernia    Hyperlipidemia    Hypertension    Past Surgical History:  Procedure Laterality Date   LEFT HEART CATH AND CORONARY ANGIOGRAPHY N/A 07/25/2016   Procedure: Left Heart Cath and Coronary Angiography;  Surgeon: Odie Benne, MD;  Location: [facility];  Service: Cardiovascular;  Laterality: N/A;   Family History  Problem Relation Age of Onset   Heart disease Mother    Alcohol abuse Father    Alcohol abuse Sister    Diabetes Sister    Hypertension Sister    Cancer Sister    CAD Sister        One sister with stents   Alcohol abuse Brother    Diabetes Brother    Hypertension Brother    Social History   Socioeconomic History   Marital status: Divorced    Spouse name: Not on file   Number of children: 8   Years of education: Not on file   Highest education level: Not on file  Occupational History   Occupation: RETIRED  Tobacco Use   Smoking status: Former   Smokeless tobacco: Never  Advertising account planner   Vaping status: Never Used  Substance and Sexual Activity   Alcohol use: No    Alcohol/week: 0.0 standard drinks of alcohol   Drug use: No   Sexual activity: Not on file  Other Topics Concern   Not on file  Social History Narrative   Lives with her daughter/2025   Social Drivers of Health   Financial Resource Strain: Low Risk  (07/07/2023)   Overall Financial Resource Strain (CARDIA)    Difficulty of Paying Living Expenses: Not hard at all  Food Insecurity: No Food Insecurity (07/07/2023)   Hunger Vital Sign    Worried About Running Out of Food in the Last Year: Never true    Ran Out of Food in the Last Year: Never true  Transportation Needs: No Transportation Needs (07/07/2023)   PRAPARE - Administrator, Civil Service (Medical): No    Lack of Transportation (Non-Medical): No  Physical Activity: Sufficiently Active (07/07/2023)   Exercise Vital Sign    Days of Exercise per Week: 6 days    Minutes of Exercise per Session: 30 min   Stress: No Stress Concern Present (07/07/2023)   [employer] of Occupational Health - Occupational Stress Questionnaire    Feeling of Stress : Only a little  Social Connections: Socially Isolated (07/07/2023)   Social Connection and Isolation Panel [NHANES]    Frequency of Communication with Friends and Family: More than three times a week    Frequency of Social Gatherings with Friends and Family: More than three times a week    Attends Religious Services: Never    Database administrator or Organizations: No    Attends Banker Meetings: Never    Marital Status: Divorced  Tobacco Counseling Counseling given: Not Answered    Clinical Intake:  Pre-visit preparation completed: Yes  Pain : 0-10 Pain Score: 5  Pain Type: Chronic pain Pain Location: Back (rt leg) Pain Orientation: Lower Pain Radiating Towards: from lower back toward rt leg Pain Descriptors / Indicators: Burning, Discomfort Pain Onset: 1 to 4 weeks ago Pain Frequency: Intermittent Pain Relieving Factors: topical Effect of Pain on Daily Activities: when she walks  Pain Relieving Factors: topical  BMI - recorded: 30.85 Nutritional Status: BMI > 30  Obese Nutritional Risks: None Diabetes: Yes CBG done?: No Did pt. bring in CBG monitor from home?: No  Lab Results  Component Value Date   HGBA1C 5.9 01/29/2023   HGBA1C 5.8 04/22/2022   HGBA1C 6.0 02/28/2021     How often do you need to have someone help you when you read instructions, pamphlets, or other written materials from your doctor or pharmacy?: 1 - Never  Interpreter Needed?: No  Information entered by :: Kateri Balch, RMA   Activities of Daily Living     07/07/2023    4:02 PM  In your present state of health, do you have any difficulty performing the following activities:  Hearing? 0  Vision? 0  Difficulty concentrating or making decisions? 0  Walking or climbing stairs? 0  Dressing or bathing? 0  Doing errands,  shopping? 0  Comment daughter drives her.  Preparing Food and eating ? N  Using the Toilet? N  In the past six months, have you accidently leaked urine? N  Do you have problems with loss of bowel control? N  Managing your Medications? N  Managing your Finances? N  Housekeeping or managing your Housekeeping? N    Patient Care Team: Colene Dauphin, MD as PCP - General (Internal Medicine) Eilleen Grates, MD as PCP - Cardiology (Cardiology) [facility] as Consulting Physician (Optometry)  Indicate any recent Medical Services you may have received from other than [facility] providers in the past year (date may be approximate).     Assessment:    This is a routine wellness examination for Zyonna.  Hearing/Vision screen Hearing Screening - Comments:: Denies hearing difficulties   Vision Screening - Comments:: Wears eyeglasses   Goals Addressed             This Visit's Progress    Try to maintain, stay independent and socially involved.   On track      Depression Screen     07/07/2023    4:18 PM 01/29/2023    8:43 AM 11/11/2022    8:17 AM 04/22/2022    8:10 AM 03/07/2022    3:20 PM 01/25/2022    8:51 AM 12/07/2021    2:12 PM  PHQ 2/9 Scores  PHQ - 2 Score 2 0 1 0 0 0 0  PHQ- 9 Score 5 0  0 0 0 0    Fall Risk     07/07/2023    4:11 PM 01/29/2023    8:43 AM 11/11/2022    8:16 AM 04/22/2022    8:09 AM 03/07/2022    3:19 PM  Fall Risk   Falls in the past year? 0 0 0 0 0  Number falls in past yr: 0 0 0 0 0  Injury with Fall? 0 0 0 0 0  Risk for fall due to : Medication side effect No Fall Risks Impaired balance/gait No Fall Risks No Fall Risks  Follow up Falls prevention discussed;Falls evaluation completed Falls evaluation completed  Falls evaluation completed Falls evaluation completed Falls prevention discussed    MEDICARE RISK AT HOME:  Medicare Risk at Home Any stairs in or around the home?: Yes (outside in front) If so, are there any without handrails?:  Yes Home free of loose throw rugs in walkways, pet beds, electrical cords, etc?: Yes Adequate lighting in your home to reduce risk of falls?: Yes Life alert?: No Use of a cane, walker or w/c?: Yes (cane sometimes) Grab bars in the bathroom?: Yes Shower chair or bench in shower?: Yes Elevated toilet seat or a handicapped toilet?: Yes  TIMED UP AND GO:  Was the test performed?  No  Cognitive Function: 6CIT completed    12/20/2016    4:25 PM 12/20/2016    3:38 PM  MMSE - Mini Mental State Exam  Not completed: -- Refused        07/07/2023    4:12 PM 03/07/2022    3:25 PM 01/03/2020    2:00 PM  6CIT Screen  What Year? 0 points 0 points 0 points  What month? 0 points 0 points 0 points  What time? 0 points 0 points 0 points  Count back from 20 0 points 0 points 0 points  Months in reverse 0 points 0 points 0 points  Repeat phrase 10 points 0 points 0 points  Total Score 10 points 0 points 0 points    Immunizations Immunization History  Administered Date(s) Administered   Fluad Quad(high Dose 65+) 12/04/2018, 11/05/2019, 12/06/2020   Fluad Trivalent(High Dose 65+) 11/11/2022   Influenza, High Dose Seasonal PF 11/14/2015, 12/20/2016, 12/05/2017, 10/18/2021   Influenza,inj,Quad PF,6+ Mos 11/22/2014   Moderna Sars-Covid-2 Vaccination 03/30/2019, 04/28/2019   Pneumococcal Conjugate-13 03/24/2015   Pneumococcal Polysaccharide-23 12/20/2016    Screening Tests Health Maintenance  Topic Date Due   DTaP/Tdap/Td (1 - Tdap) Never done   Zoster Vaccines- Shingrix (1 of 2) Never done   FOOT EXAM  01/16/2022   COVID-19 Vaccine (3 - 2024-25 season) 10/20/2022   Diabetic kidney evaluation - Urine ACR  04/22/2023   HEMOGLOBIN A1C  07/30/2023   INFLUENZA VACCINE  09/19/2023   Diabetic kidney evaluation - eGFR measurement  01/29/2024   OPHTHALMOLOGY EXAM  04/15/2024   Medicare Annual Wellness (AWV)  07/06/2024   DEXA SCAN  07/13/2024   Pneumonia Vaccine 34+ Years old  Completed    Hepatitis C Screening  Completed   HPV VACCINES  Aged Out   Meningococcal B Vaccine  Aged Out   Colonoscopy  Discontinued    Health Maintenance  Health Maintenance Due  Topic Date Due   DTaP/Tdap/Td (1 - Tdap) Never done   Zoster Vaccines- Shingrix (1 of 2) Never done   FOOT EXAM  01/16/2022   COVID-19 Vaccine (3 - 2024-25 season) 10/20/2022   Diabetic kidney evaluation - Urine ACR  04/22/2023   Health Maintenance Items Addressed: Diabetic Foot Exam scheduled, UACR (Urine Albumin:Creatinine Ratio), See Nurse Notes  Additional Screening:  Vision Screening: Recommended annual ophthalmology exams for early detection of glaucoma and other disorders of the eye.  Dental Screening: Recommended annual dental exams for proper oral hygiene  Community Resource Referral / Chronic Care Management: CRR required this visit?  No   CCM required this visit?  No   Plan:    I have personally reviewed and noted the following in the patient's chart:   Medical and social history Use of alcohol, tobacco or illicit drugs  Current medications and supplements including opioid prescriptions. Patient is not  currently taking opioid prescriptions. Functional ability and status Nutritional status Physical activity Advanced directives List of other physicians Hospitalizations, surgeries, and ER visits in previous 12 months Vitals Screenings to include cognitive, depression, and falls Referrals and appointments  In addition, I have reviewed and discussed with patient certain preventive protocols, quality metrics, and best practice recommendations. A written personalized care plan for preventive services as well as general preventive health recommendations were provided to patient.   Emilliano Dilworth L Romeo Zielinski, CMA   07/07/2023   After Visit Summary: (Mail) Due to this being a telephonic visit, the after visit summary with patients personalized plan was offered to patient via mail   Notes: Please refer to  Routing Comments.

## 2023-07-08 NOTE — Patient Instructions (Signed)
 Susan Brady , Thank you for taking time out of your busy schedule to complete your Annual Wellness Visit with me. I enjoyed our conversation and look forward to speaking with you again next year. I, as well as your care team,  appreciate your ongoing commitment to your health goals. Please review the following plan we discussed and let me know if I can assist you in the future. Your Game plan/ To Do List   Follow up Visits: Next Medicare AWV with our clinical staff: 07/08/23.   Have you seen your provider in the last 6 months (3 months if uncontrolled diabetes)? No Next Office Visit with your provider: Patient stated that she will have to wait on daughter to schedule her a office visit.    Clinician Recommendations:  Aim for 30 minutes of exercise or brisk walking, 6-8 glasses of water, and 5 servings of fruits and vegetables each day. You are due for a foot exam and an kidney evaluation and will get these done during your next office visit.  You are also due for a tetanus and Shingles vaccine.  Each day, aim for 6 glasses of water, plenty of protein in your diet and try to get up and walk/ stretch every hour for 5-10 minutes at a time.  .        This is a list of the screening recommended for you and due dates:  Health Maintenance  Topic Date Due   DTaP/Tdap/Td vaccine (1 - Tdap) Never done   Zoster (Shingles) Vaccine (1 of 2) Never done   Complete foot exam   01/16/2022   COVID-19 Vaccine (3 - 2024-25 season) 10/20/2022   Yearly kidney health urinalysis for diabetes  04/22/2023   Hemoglobin A1C  07/30/2023   Flu Shot  09/19/2023   Yearly kidney function blood test for diabetes  01/29/2024   Eye exam for diabetics  04/15/2024   Medicare Annual Wellness Visit  07/06/2024   DEXA scan (bone density measurement)  07/13/2024   Pneumonia Vaccine  Completed   Hepatitis C Screening  Completed   HPV Vaccine  Aged Out   Meningitis B Vaccine  Aged Out   Colon Cancer Screening  Discontinued     Advanced directives: (Declined) Advance directive discussed with you today. Even though you declined this today, please call our office should you change your mind, and we can give you the proper paperwork for you to fill out. Advance Care Planning is important because it:  [x]  Makes sure you receive the medical care that is consistent with your values, goals, and preferences  [x]  It provides guidance to your family and loved ones and reduces their decisional burden about whether or not they are making the right decisions based on your wishes.  Follow the link provided in your after visit summary or read over the paperwork we have mailed to you to help you started getting your Advance Directives in place. If you need assistance in completing these, please reach out to us  so that we can help you!  See attachments for Preventive Care and Fall Prevention Tips.

## 2023-07-24 ENCOUNTER — Ambulatory Visit: Payer: Self-pay

## 2023-07-24 DIAGNOSIS — G4486 Cervicogenic headache: Secondary | ICD-10-CM

## 2023-07-24 NOTE — Addendum Note (Signed)
 Addended by: Colene Dauphin on: 07/24/2023 04:58 PM   Modules accepted: Orders

## 2023-07-24 NOTE — Telephone Encounter (Signed)
 FYI Only or Action Required?: Action required by provider  Patient was last seen in primary care on 05/23/2023 by Zorita Hiss, NP. Called Nurse Triage reporting Pain. Symptoms began several years ago. Interventions attempted: Prescription medications: gabapentin . Symptoms are: gradually worsening.  Triage Disposition: See PCP When Office is Open (Within 3 Days)  Patient/caregiver understands and will follow disposition?: No, wishes to speak with PCP Requesting increase to gabapentin  or other recommendations.  Copied from CRM [id number]. Topic: Clinical - Medication Question >> Jul 24, 2023  9:41 AM Alyse July wrote: Reason for CRM: Patient would like to know if patient gabapentin  (NEURONTIN ) 100 MG capsule  Can be increase due to patient current pain level. Patient daughter also would like to know if there are other recommendation if unable to increase dose.  [id number]. Reason for Disposition  [1] MODERATE back pain (e.g., interferes with normal activities) AND [2] present > 3 days  Answer Assessment - Initial Assessment Questions 1. ONSET: "When did the pain begin?"      Chronic 2. LOCATION: "Where does it hurt?" (upper, mid or lower back)     Lower back  3. SEVERITY: "How bad is the pain?"  (e.g., Scale 1-10; mild, moderate, or severe)   - MILD (1-3): Doesn't interfere with normal activities.    - MODERATE (4-7): Interferes with normal activities or awakens from sleep.    - SEVERE (8-10): Excruciating pain, unable to do any normal activities.      Moderate-severe 4. PATTERN: "Is the pain constant?" (e.g., yes, no; constant, intermittent)      constant 5. RADIATION: "Does the pain shoot into your legs or somewhere else?"     legs 6. CAUSE:  "What do you think is causing the back pain?"      sciatica 7. BACK OVERUSE:  "Any recent lifting of heavy objects, strenuous work or exercise?"     denies 8. MEDICINES: "What have you taken so far for the pain?" (e.g., nothing, acetaminophen ,  NSAIDS)     Gabapentin  9. NEUROLOGIC SYMPTOMS: "Do you have any weakness, numbness, or problems with bowel/bladder control?"     Denies 10. OTHER SYMPTOMS: "Do you have any other symptoms?" (e.g., fever, abdomen pain, burning with urination, blood in urine)       Denies Additional information: sciatica pain is worsening, lower back pain worsening. No headache today.  Would like increase gabapentin , or other recommendation by Dr. Donnette Gal. Declines follow up office visit at this time.  Protocols used: Back Pain-A-AH

## 2023-07-24 NOTE — Telephone Encounter (Signed)
 Please confirm what dose of gabapentin  she is taking.  She saw neurology in April and her gabapentin  dose was increased and according to her medicalize she should be taking 200 or 2 pills every morning and 4 pills at bedtime-she increased this based on the fact that she thought she was taking 200 mg in the morning and 300 mg at bedtime.

## 2023-07-24 NOTE — Telephone Encounter (Signed)
 Copied from CRM [id number]. Topic: General - Other >> Jul 24, 2023  2:16 PM Alpha Arts wrote: Reason for CRM: Patient's daughter, [place] La called to confirm dosage for gabapentin  (NEURONTIN ) 100 MG capsule. Patient is taking 2 in the morning and 4 at night.

## 2023-07-24 NOTE — Telephone Encounter (Signed)
 We can increase the gabapentin  further  but side effects are always a concern and / or refer to neurosurgery or pain management - they may be able to do a nerve block to help the pain

## 2023-07-24 NOTE — Telephone Encounter (Signed)
 Left message today for Arizona La to call back and confirm correct dosing so that we may be able to advise accordingly.

## 2023-07-24 NOTE — Telephone Encounter (Signed)
 Referral ordered for [facility] neurosurgery - they have a doctor there that does pain management.

## 2023-08-04 ENCOUNTER — Telehealth: Payer: Self-pay | Admitting: Internal Medicine

## 2023-08-04 NOTE — Telephone Encounter (Unsigned)
 Copied from CRM [id number]. Topic: General - Other >> Aug 04, 2023 12:20 PM Dorisann Garre T wrote: Reason for CRM: patient daughter called in stating patient is still in pain and is needing for something to maybe done in the process until the referral is approved the referral is still pending

## 2023-08-06 MED ORDER — TRAMADOL HCL 50 MG PO TABS: 50.0000 mg | ORAL_TABLET | Freq: Three times a day (TID) | ORAL | 0 refills | Status: DC | PRN

## 2023-08-06 NOTE — Telephone Encounter (Signed)
 Tramadol sent to pharmacy

## 2023-08-06 NOTE — Telephone Encounter (Signed)
 Susan Brady can you please ask [name] to see if this referral has been sent  Call her daughter-we can try tramadol  to see if that helps.  It is a very mild narcotic.  Let her know it can negatively affect her memory, cause constipation and possibly drowsiness.  Other option is to increase gabapentin  slightly.

## 2023-08-16 ENCOUNTER — Other Ambulatory Visit: Payer: Self-pay | Admitting: Internal Medicine

## 2023-08-27 ENCOUNTER — Telehealth: Payer: Self-pay | Admitting: Internal Medicine

## 2023-08-27 NOTE — Telephone Encounter (Signed)
 Spoke with Delayne (patient's daughter) today and number given so she could call and self schedule for patient.

## 2023-08-27 NOTE — Telephone Encounter (Signed)
 Copied from CRM [id number]. Topic: Referral - Status >> Aug 27, 2023  9:15 AM Larissa RAMAN wrote: Reason for CRM: Patient's daughter calling to check the status of pain referral.  Callback # [phone]

## 2023-10-07 ENCOUNTER — Other Ambulatory Visit: Payer: Self-pay | Admitting: Cardiology

## 2023-11-15 ENCOUNTER — Other Ambulatory Visit: Payer: Self-pay | Admitting: General Practice

## 2023-11-17 ENCOUNTER — Other Ambulatory Visit: Payer: Self-pay | Admitting: Internal Medicine

## 2023-11-17 ENCOUNTER — Telehealth: Payer: Self-pay | Admitting: Cardiology

## 2023-11-17 MED ORDER — METOPROLOL SUCCINATE ER 25 MG PO TB24: 25.0000 mg | ORAL_TABLET | Freq: Every day | ORAL | 0 refills | Status: DC

## 2023-11-17 NOTE — Telephone Encounter (Signed)
Pt' medication was sent to pt's pharmacy as requested. Confirmation received.  

## 2023-11-17 NOTE — Telephone Encounter (Unsigned)
 Copied from CRM [id number]. Topic: Clinical - Medication Refill >> Nov 17, 2023  9:45 AM Robinson H wrote: Medication: potassium chloride  SA (KLOR-CON  M) 20 MEQ tablet  Has the patient contacted their pharmacy? Yes, waiting on provider approval (Agent: If no, request that the patient contact the pharmacy for the refill. If patient does not wish to contact the pharmacy document the reason why and proceed with request.) (Agent: If yes, when and what did the pharmacy advise?)  This is the patient's preferred pharmacy:  [id number] - [place], [place] - [address] AT [facility] OF [address] [place] [address] Phone: [phone] Fax: [phone]  Is this the correct pharmacy for this prescription? Yes If no, delete pharmacy and type the correct one.   Has the prescription been filled recently? No  Is the patient out of the medication? Yes  Has the patient been seen for an appointment in the last year OR does the patient have an upcoming appointment? Yes  Can we respond through MyChart? Yes  Agent: Please be advised that Rx refills may take up to 3 business days. We ask that you follow-up with your pharmacy.

## 2023-11-17 NOTE — Telephone Encounter (Signed)
*  STAT* If patient is at the pharmacy, call can be transferred to refill team.   1. Which medications need to be refilled? (please list name of each medication and dose if known)   metoprolol  succinate (TOPROL -XL) 25 MG 24 hr tablet     4. Which pharmacy/location (including street and city if local pharmacy) is medication to be sent to? [id number] - [place], [place] - [address] AT [facility] OF [address]    5. Do they need a 30 day or 90 day supply? 90    Pt scheduled for  10/3

## 2023-11-18 MED ORDER — POTASSIUM CHLORIDE CRYS ER 20 MEQ PO TBCR: 20.0000 meq | EXTENDED_RELEASE_TABLET | Freq: Every day | ORAL | 2 refills | Status: AC

## 2023-11-19 DIAGNOSIS — M47816 Spondylosis without myelopathy or radiculopathy, lumbar region: Secondary | ICD-10-CM | POA: Diagnosis not present

## 2023-11-19 DIAGNOSIS — M4319 Spondylolisthesis, multiple sites in spine: Secondary | ICD-10-CM | POA: Diagnosis not present

## 2023-11-19 DIAGNOSIS — M4807 Spinal stenosis, lumbosacral region: Secondary | ICD-10-CM | POA: Diagnosis not present

## 2023-11-19 DIAGNOSIS — M5416 Radiculopathy, lumbar region: Secondary | ICD-10-CM | POA: Diagnosis not present

## 2023-11-19 DIAGNOSIS — M48061 Spinal stenosis, lumbar region without neurogenic claudication: Secondary | ICD-10-CM | POA: Diagnosis not present

## 2023-11-20 ENCOUNTER — Other Ambulatory Visit: Payer: Self-pay | Admitting: Internal Medicine

## 2023-11-20 NOTE — Progress Notes (Unsigned)
  Cardiology Office Note:   Date:  11/21/2023  ID:  Susan Brady, DOB October 24, 1945, MRN [id number] PCP: Geofm Glade PARAS, MD   [facility] Providers Cardiologist:  Lynwood Schilling, MD {  History of Present Illness:   Susan Brady is a 79 y.o. female who presents for follow up of chest pain.  She did have an abnormal Lexiscan  Myoview .  However she had normal coronaries on cath.  She did have cardiomegaly on CXR but had an echo with NL LV function.  There was grade two diastolic dysfunction.   She had palpitations and had a monitor which demonstrated frequent sustained SVT.   She saw Dr. Fernande who felt that this was atrial tach.  She was treated with flecainide .  She had some breakthrough tachycardia previously so I did increase her flecainide  from 50 twice a day to 75 twice a day. She eventually was increased to 150 mg twice a day.  She rarely feels palpitations.  She might once in a while but nothing is sustained.  She does not have any presyncope or syncope.  She lives with her daughter and she does a lot of the chores.  She denies any cardiovascular symptoms with this.  She is not having any chest pressure, neck or arm discomfort.  She is not having any weight gain or edema.  ROS: As stated in the HPI and negative for all other systems.  Studies Reviewed:    EKG:   EKG Interpretation Date/Time:  Friday November 21 2023 11:12:43 EDT Ventricular Rate:  57 PR Interval:  212 QRS Duration:  88 QT Interval:  416 QTC Calculation: 404 R Axis:   -61  Text Interpretation: Sinus bradycardia with 1st degree A-V block Left axis deviation Low voltage QRS Poor anterior R wave progression When compared with ECG of 12-Dec-2020 12:49, No significant change was found Confirmed by Schilling Lynwood ([id number]) on 11/21/2023 11:26:10 AM   Risk Assessment/Calculations:       Physical Exam:   VS:  BP 122/64   Pulse (!) 57   Ht 5' 7 (1.702 m)   Wt 186 lb (84.4 kg)   SpO2 96%   BMI 29.13 kg/m    Wt  Readings from Last 3 Encounters:  11/21/23 186 lb (84.4 kg)  07/07/23 197 lb (89.4 kg)  06/05/23 197 lb (89.4 kg)     GEN: Well nourished, well developed in no acute distress NECK: No JVD; No carotid bruits CARDIAC: RRR, no murmurs, rubs, gallops RESPIRATORY:  Clear to auscultation without rales, wheezing or rhonchi  ABDOMEN: Soft, non-tender, non-distended EXTREMITIES:  No edema; No deformity   ASSESSMENT AND PLAN:       SVT: She tolerates current medications.  I will check electrolytes today.  EKG is unchanged on the flecainide .  No change in therapy.  Essential hypertension: Her blood pressure is well-controlled.  She will continue on the meds as listed.   Sleep apnea : She had some positive findings on her previous sleep study but I did MR done last year demonstrated no significant sleep apnea.  No change in therapy.  Follow up with me in 1 year.  Signed, Lynwood Schilling, MD

## 2023-11-21 ENCOUNTER — Ambulatory Visit: Attending: Cardiology | Admitting: Cardiology

## 2023-11-21 ENCOUNTER — Encounter: Payer: Self-pay | Admitting: Cardiology

## 2023-11-21 VITALS — BP 122/64 | HR 57 | Ht 67.0 in | Wt 186.0 lb

## 2023-11-21 DIAGNOSIS — I471 Supraventricular tachycardia, unspecified: Secondary | ICD-10-CM

## 2023-11-21 DIAGNOSIS — I10 Essential (primary) hypertension: Secondary | ICD-10-CM

## 2023-11-21 MED ORDER — FUROSEMIDE 20 MG PO TABS: ORAL_TABLET | ORAL | 3 refills | Status: AC

## 2023-11-21 NOTE — Patient Instructions (Signed)
 Medication Instructions:  Refilled Lasix  *If you need a refill on your cardiac medications before your next appointment, please call your pharmacy*  Lab Work: BMET, CBC, Magnesium If you have labs (blood work) drawn today and your tests are completely normal, you will receive your results only by: MyChart Message (if you have MyChart) OR A paper copy in the mail If you have any lab test that is abnormal or we need to change your treatment, we will call you to review the results.  Testing/Procedures: NONE  Follow-Up: At [facility], you and your health needs are our priority.  As part of our continuing mission to provide you with exceptional heart care, our providers are all part of one team.  This team includes your primary Cardiologist (physician) and Advanced Practice Providers or APPs (Physician Assistants and Nurse Practitioners) who all work together to provide you with the care you need, when you need it.  Your next appointment:   1 year(s)  Provider:   Lynwood Schilling, MD    We recommend signing up for the patient portal called MyChart.  Sign up information is provided on this After Visit Summary.  MyChart is used to connect with patients for Virtual Visits (Telemedicine).  Patients are able to view lab/test results, encounter notes, upcoming appointments, etc.  Non-urgent messages can be sent to your provider as well.   To learn more about what you can do with MyChart, go to [email or web address].

## 2023-11-22 LAB — CBC
Hematocrit: 39 % (ref 34.0–46.6)
Hemoglobin: 12.1 g/dL (ref 11.1–15.9)
MCH: 28.4 pg (ref 26.6–33.0)
MCHC: 31 g/dL — ABNORMAL LOW (ref 31.5–35.7)
MCV: 92 fL (ref 79–97)
Platelets: 317 x10E3/uL (ref 150–450)
RBC: 4.26 x10E6/uL (ref 3.77–5.28)
RDW: 13.2 % (ref 11.7–15.4)
WBC: 5.9 x10E3/uL (ref 3.4–10.8)

## 2023-11-22 LAB — BASIC METABOLIC PANEL WITH GFR
BUN/Creatinine Ratio: 12 (ref 12–28)
BUN: 10 mg/dL (ref 8–27)
CO2: 26 mmol/L (ref 20–29)
Calcium: 9.6 mg/dL (ref 8.7–10.3)
Chloride: 102 mmol/L (ref 96–106)
Creatinine, Ser: 0.83 mg/dL (ref 0.57–1.00)
Glucose: 88 mg/dL (ref 70–99)
Potassium: 4.4 mmol/L (ref 3.5–5.2)
Sodium: 143 mmol/L (ref 134–144)
eGFR: 72 mL/min/1.73 (ref >59)

## 2023-11-22 LAB — MAGNESIUM: Magnesium: 2.2 mg/dL (ref 1.6–2.3)

## 2023-11-24 ENCOUNTER — Ambulatory Visit: Payer: Self-pay | Admitting: Cardiology

## 2023-12-10 DIAGNOSIS — M5416 Radiculopathy, lumbar region: Secondary | ICD-10-CM | POA: Diagnosis not present

## 2023-12-15 DIAGNOSIS — M5416 Radiculopathy, lumbar region: Secondary | ICD-10-CM | POA: Diagnosis not present

## 2024-01-02 ENCOUNTER — Other Ambulatory Visit: Payer: Self-pay | Admitting: Internal Medicine

## 2024-01-03 ENCOUNTER — Other Ambulatory Visit: Payer: Self-pay | Admitting: Internal Medicine

## 2024-01-03 ENCOUNTER — Other Ambulatory Visit: Payer: Self-pay | Admitting: General Practice

## 2024-01-12 ENCOUNTER — Encounter: Payer: Self-pay | Admitting: *Deleted

## 2024-01-12 NOTE — Progress Notes (Signed)
 Susan Brady                                          MRN: [id number]   01/12/2024   The VBCI Quality Team Specialist reviewed this patient medical record for the purposes of chart review for care gap closure. The following were reviewed: chart review for care gap closure-kidney health evaluation for diabetes:eGFR  and uACR.    VBCI Quality Team

## 2024-01-13 ENCOUNTER — Ambulatory Visit: Admitting: Adult Health

## 2024-01-13 ENCOUNTER — Encounter: Payer: Self-pay | Admitting: Adult Health

## 2024-01-13 VITALS — BP 118/64 | HR 53 | Ht 67.0 in | Wt 183.4 lb

## 2024-01-13 DIAGNOSIS — G4486 Cervicogenic headache: Secondary | ICD-10-CM

## 2024-01-13 DIAGNOSIS — R519 Headache, unspecified: Secondary | ICD-10-CM

## 2024-01-13 DIAGNOSIS — M4802 Spinal stenosis, cervical region: Secondary | ICD-10-CM | POA: Diagnosis not present

## 2024-01-13 MED ORDER — DULOXETINE HCL 20 MG PO CPEP: 20.0000 mg | ORAL_CAPSULE | Freq: Every day | ORAL | 5 refills | Status: AC

## 2024-01-13 NOTE — Progress Notes (Signed)
 Referring:  Geofm Glade PARAS, MD [address] [place],  [place] [address]  PCP: Geofm Glade PARAS, MD   CC:  headaches  History provided from self, daughter  Follow-up visit:  Prior visit: 06/05/2023  HPI:   Susan Brady is a 78 y.o. female who is being followed for daily left-sided headaches and cervicalgia with radiculopathy. CTH with left-sided sinus disease and partially empty sella likely incidental finding as this was present on prior MRI in 2021 and recent eye exam normal. MR c-spine showed moderate to severe spinal stenosis at C3-4, C4-5, C5-6 and C6-7 with pinched nerve at left C7.   At prior visit, noted improvement of neck pain but complained of worsening headaches over the past 2 to 3 weeks and flare of sciatica therefore increase gabapentin  to 200/400.     Interval history:  Patient returns for follow-up visit accompanied by her daughter.  She reports continued left-sided headaches that radiate down from her outer left eye and into her neck. Denies pain radiating towards her face. She reports they have been present almost daily but per daughter, she has not mentioned having a recent headache or pain until a couple days ago. She stopped gabapentin  due to lack of benefit. She does note pain relief when she removes her glasses therefore daughter felt glasses putting pressure causing symptoms but glasses are not tight. She is interested in other treatment for headaches.   She is now being followed by Dr. Darlis for right-sided lumbar radiculopathy.  Received injection last month with improvement of pain.       Current Treatment: Abortive Tylenol   Preventative none  Prior Therapies                                 Tylenol  - helps Metoprolol  Lisinopirl - cough Gabapentin       LABS: CBC    Component Value Date/Time   WBC 5.9 11/21/2023 1223   WBC 5.1 01/29/2023 0915   RBC 4.26 11/21/2023 1223   RBC 4.25 01/29/2023 0915   HGB 12.1 11/21/2023 1223    HCT 39.0 11/21/2023 1223   PLT 317 11/21/2023 1223   MCV 92 11/21/2023 1223   MCH 28.4 11/21/2023 1223   MCH 28.4 01/02/2022 0404   MCHC 31.0 (L) 11/21/2023 1223   MCHC 32.1 01/29/2023 0915   RDW 13.2 11/21/2023 1223   LYMPHSABS 2.1 01/29/2023 0915   MONOABS 0.7 01/29/2023 0915   EOSABS 0.1 01/29/2023 0915   BASOSABS 0.0 01/29/2023 0915      Latest Ref Rng & Units 11/21/2023   12:23 PM 01/29/2023    9:15 AM 04/22/2022    8:49 AM  CMP  Glucose 70 - 99 mg/dL 88  897  92   BUN 8 - 27 mg/dL 10  12  12    Creatinine 0.57 - 1.00 mg/dL 9.16  9.22  9.26   Sodium 134 - 144 mmol/L 143  142  142   Potassium 3.5 - 5.2 mmol/L 4.4  3.9  3.9   Chloride 96 - 106 mmol/L 102  104  104   CO2 20 - 29 mmol/L 26  32  31   Calcium  8.7 - 10.3 mg/dL 9.6  9.4  9.6   Total Protein 6.0 - 8.3 g/dL  6.9  6.9   Total Bilirubin 0.2 - 1.2 mg/dL  0.6  0.5   Alkaline Phos 39 - 117 U/L  51  43  AST 0 - 37 U/L  15  18   ALT 0 - 35 U/L  9  9    ESR 17  IMAGING:  [facility] 01/02/22: 1. No acute intracranial CT findings. 2. Mild atrophy and small-vessel disease, new from 2016. 3. Mildly expanded empty sella. Nonspecific finding but can be seen with idiopathic intracranial hypertension. 4. Sinus disease.  Imaging independently reviewed on January 13, 2024   Current Outpatient Medications on File Prior to Visit  Medication Sig Dispense Refill   acetaminophen  (TYLENOL ) 500 MG tablet Take 500-1,000 mg by mouth every 6 (six) hours as needed for mild pain.     atorvastatin  (LIPITOR) 20 MG tablet TAKE 1 TABLET BY MOUTH DAILY 90 tablet 1   cholecalciferol (VITAMIN D) 1000 UNITS tablet Take 5,000 Units by mouth daily.      flecainide  (TAMBOCOR ) 150 MG tablet TAKE 1 TABLET(150 MG) BY MOUTH TWICE DAILY 180 tablet 3   furosemide  (LASIX ) 20 MG tablet TAKE 1 TABLET(20 MG) BY MOUTH TWICE DAILY. 180 tablet 3   Garlic 1000 MG CAPS Take 1,000 mg by mouth daily.     hydrALAZINE  (APRESOLINE ) 100 MG tablet TAKE 1 TABLET(100 MG) BY  MOUTH THREE TIMES DAILY 270 tablet 1   metoprolol  succinate (TOPROL -XL) 25 MG 24 hr tablet Take 1 tablet (25 mg total) by mouth daily. 90 tablet 0   Omega-3 Fatty Acids (FISH OIL) 1000 MG CAPS Take 1 capsule by mouth daily.     pantoprazole  (PROTONIX ) 40 MG tablet TAKE 1 TABLET(40 MG) BY MOUTH DAILY 90 tablet 2   potassium chloride  SA (KLOR-CON  M) 20 MEQ tablet Take 1 tablet (20 mEq total) by mouth daily. 90 tablet 2   vitamin E 400 UNIT capsule Take 1,000 Units by mouth daily.      No current facility-administered medications on file prior to visit.     Allergies: Allergies  Allergen Reactions   Lisinopril Anaphylaxis and Other (See Comments)    cough   Trazodone And Nefazodone     hallucinations    Family History: Migraine or other headaches in the family:  none Aneurysms in a first degree relative:  none Brain tumors in the family:  none Other neurological illness in the family:   none  Past Medical History: Past Medical History:  Diagnosis Date   Anemia    CHF (congestive heart failure) (HCC)    Diabetes mellitus without complication (HCC)    Gastric ulcer    Glaucoma    Hiatal hernia    Hyperlipidemia    Hypertension     Past Surgical History Past Surgical History:  Procedure Laterality Date   LEFT HEART CATH AND CORONARY ANGIOGRAPHY N/A 07/25/2016   Procedure: Left Heart Cath and Coronary Angiography;  Surgeon: Verlin Lonni BIRCH, MD;  Location: [facility]AS[facility];  Service: Cardiovascular;  Laterality: N/A;    Social History: Social History   Tobacco Use   Smoking status: Former   Smokeless tobacco: Never  Advertising Account Planner   Vaping status: Never Used  Substance Use Topics   Alcohol use: No    Alcohol/week: 0.0 standard drinks of alcohol   Drug use: No     ROS: Negative for fevers, chills. Positive for headaches. All other systems reviewed and negative unless stated otherwise in HPI.   Physical Exam:   Vital Signs: BP 118/64 (Cuff Size: Large)    Pulse (!) 53   Ht 5' 7 (1.702 m)   Wt 183 lb 6.4 oz (83.2 kg)  SpO2 97%   BMI 28.72 kg/m  GENERAL: well appearing, very pleasant elderly African-American female, in no acute distress,alert SKIN:  Color, texture, turgor normal. No rashes or lesions HEAD:  Normocephalic/atraumatic. CV:  RRR RESP: Normal respiratory effort.  NEUROLOGICAL: Mental Status: Alert, oriented to person, place and time,Follows commands Cranial Nerves: PERRL, no papilledema visualized, visual fields intact to confrontation, extraocular movements intact, facial sensation intact, no facial droop or ptosis, hearing grossly intact, no dysarthria Motor: muscle strength 5/5 both upper and lower extremities,no drift, normal tone Reflexes: 2+ throughout Coordination: Finger-to- nose-finger intact bilaterally Gait: Antalgic gait      IMPRESSION: 78 year old female with a history of HTN, HLD, glaucoma who presents for follow-up of left sided headaches possibly in setting of spinal stenosis vs occipital neuralgia. CTH with left-sided sinus disease and partially empty sella. Suspect empty sella is an incidental finding as it was present on prior MRI in 2021 and she has had a recent normal eye exam.  MRI C-spine showed moderately severe spinal stenosis at C3-C4 and C4-C5, severe spinal stenosis at C5-C6 and moderate spinal stenosis at C6-C7 and moderately severe left foraminal narrowing at C6-C7 with potential for C7 nerve root compression. She was referred to ortho. Reports continued left sided headaches that radiate down from her eye into her neck. Pain relieved after removing glasses.     PLAN:  - start duloxetine  20 mg daily for headache and possible nerve pain.  Discussed potential side effects with additional information provided in AVS. Will call after 2-3 weeks if no benefit or sooner if difficulty tolerating - question if part of pain is from glasses or eye strain, would recommend f/u with  optometrist/ophthalmology - Continue to follow with Dr. Darlis for low back and cervical pain      Follow-up in 6 months or call earlier if needed     I personally spent a total of 25 minutes in the care of the patient today including preparing to see the patient, performing a medically appropriate exam/evaluation, counseling and educating, placing orders, and documenting clinical information in the EHR.   Harlene Bogaert, AGNP-BC  [facility] [address] [place], [place] [address]  Phone [phone] Fax [phone] Note: This document was prepared with digital dictation and possible smart phrase technology. Any transcriptional errors that result from this process are unintentional.

## 2024-01-13 NOTE — Patient Instructions (Addendum)
 Your Plan:  Start duloxetine  20mg  daily for headaches and possible nerve pain   Please call after 2-3 weeks if no benefit to discuss dosage increase or call sooner with any difficulty tolerating       Follow up in 6 months or call earlier if needed      Thank you for coming to see us  at [facility]. I hope we have been able to provide you high quality care today.  You may receive a patient satisfaction survey over the next few weeks. We would appreciate your feedback and comments so that we may continue to improve ourselves and the health of our patients.     Duloxetine  Delayed-Release Capsules What is this medication? DULOXETINE  (doo LOX e teen) treats depression, anxiety, fibromyalgia, and certain types of chronic pain such as nerve, bone, or joint pain. It increases the amount of serotonin and norepinephrine in the brain, hormones that help regulate mood and pain. It belongs to a group of medications called SNRIs. This medicine may be used for other purposes; ask your health care provider or pharmacist if you have questions. COMMON BRAND NAME(S): Cymbalta , Drizalma, Irenka What should I tell my care team before I take this medication? They need to know if you have any of these conditions: Bipolar disorder Glaucoma High blood pressure Kidney disease Liver disease Seizures Suicidal thoughts, plans, or attempt by you or a family member Take medications that treat or prevent blood clots Taken an MAOI, such as Carbex, Eldepryl, Marplan, Nardil, or Parnate in the last 14 days Trouble passing urine An unusual reaction to duloxetine , other medications, foods, dyes, or preservatives Pregnant or trying to get pregnant Breastfeeding How should I use this medication? Take this medication by mouth with water. Take it as directed on the prescription label at the same time every day. Do not cut, crush, or chew this medication. Swallow the capsules whole. Some  capsules may be opened and sprinkled on applesauce. Check with your care team or pharmacist if you are not sure. You can take this medication with or without food. Do not take your medication more often than directed. Do not stop taking this medication suddenly except upon the advice of your care team. Stopping this medication too quickly may cause serious side effects or your condition may worsen. A special MedGuide will be given to you by the pharmacist with each prescription and refill. Be sure to read this information carefully each time. Talk to your care team about the use of this medication in children. While it may be prescribed for children as young as 7 years for selected conditions, precautions do apply. Overdosage: If you think you have taken too much of this medicine contact a poison control center or emergency room at once. NOTE: This medicine is only for you. Do not share this medicine with others. What if I miss a dose? If you miss a dose, take it as soon as you can. If it is almost time for your next dose, take only that dose. Do not take double or extra doses. What may interact with this medication? Do not take this medication with any of the following: Desvenlafaxine Levomilnacipran Linezolid MAOIs, such as Carbex, Eldepryl, Emsam, Marplan, Nardil, and Parnate Methylene blue (injected into a vein) Milnacipran Safinamide Thioridazine Venlafaxine Viloxazine This medication may also interact with the following: Alcohol Amphetamines Aspirin  and aspirin -like medications Certain antibiotics, such as ciprofloxacin  and enoxacin Certain medications for blood pressure, heart disease, irregular heart beat Certain medications  for mental health conditions Certain medications for migraine headache, such as almotriptan, eletriptan, frovatriptan, naratriptan, rizatriptan, sumatriptan, zolmitriptan Certain medications that treat or prevent blood clots, such as warfarin, enoxaparin , and  dalteparin Cimetidine Fentanyl  Lithium NSAIDS, medications for pain and inflammation, such as ibuprofen  or naproxen  Phentermine Procarbazine Rasagiline Sibutramine St. John's Wort Theophylline Tramadol  Tryptophan This list may not describe all possible interactions. Give your health care provider a list of all the medicines, herbs, non-prescription drugs, or dietary supplements you use. Also tell them if you smoke, drink alcohol, or use illegal drugs. Some items may interact with your medicine. What should I watch for while using this medication? Tell your care team if your symptoms do not get better or if they get worse. Visit your care team for regular checks on your progress. Because it may take several weeks to see the full effects of this medication, it is important to continue your treatment as prescribed by your care team. This medication may cause serious skin reactions. They can happen weeks to months after starting the medication. Contact your care team right away if you notice fevers or flu-like symptoms with a rash. The rash may be red or purple and then turn into blisters or peeling of the skin. You may also notice a red rash with swelling of the face, lips, or lymph nodes in your neck or under your arms. Watch for new or worsening thoughts of suicide or depression. This includes sudden changes in mood, behaviors, or thoughts. These changes can happen at any time but are more common in the beginning of treatment or after a change in dose. Call your care team right away if you experience these thoughts or worsening depression. This medication may cause mood and behavior changes, such as anxiety, nervousness, irritability, hostility, restlessness, excitability, hyperactivity, or trouble sleeping. These changes can happen at any time but are more common in the beginning of treatment or after a change in dose. Call your care team right away if you notice any of these symptoms. This  medication may affect your coordination, reaction time, or judgment. Do not drive or operate machinery until you know how this medication affects you. Sit up or stand slowly to reduce the risk of dizzy or fainting spells. Drinking alcohol with this medication can increase the risk of these side effects. This medication may increase blood sugar. The risk may be higher in patients who already have diabetes. Ask your care team what you can do to lower your risk of diabetes while taking this medication. This medication can cause an increase in blood pressure. This medication can also cause a sudden drop in your blood pressure, which may make you feel faint and increase the chance of a fall. These effects are most common when you first start the medication or when the dose is increased, or during use of other medications that can cause a sudden drop in blood pressure. Check with your care team for instructions on monitoring your blood pressure while taking this medication. Your mouth may get dry. Chewing sugarless gum or sucking hard candy and drinking plenty of water may help. Contact your care team if the problem does not go away or is severe. What side effects may I notice from receiving this medication? Side effects that you should report to your care team as soon as possible: Allergic reactions--skin rash, itching, hives, swelling of the face, lips, tongue, or throat Bleeding--bloody or black, tar-like stools, red or dark brown urine, vomiting blood or  brown material that looks like coffee grounds, small, red or purple spots on skin, unusual bleeding or bruising Increase in blood pressure Liver injury--right upper belly pain, loss of appetite, nausea, light-colored stool, dark yellow or brown urine, yellowing skin or eyes, unusual weakness or fatigue Low sodium level--muscle weakness, fatigue, dizziness, headache, confusion Redness, blistering, peeling, or loosening of the skin, including inside the  mouth Serotonin syndrome--irritability, confusion, fast or irregular heartbeat, muscle stiffness, twitching muscles, sweating, high fever, seizures, chills, vomiting, diarrhea Sudden eye pain or change in vision such as blurry vision, seeing halos around lights, vision loss Thoughts of suicide or self-harm, worsening mood, feelings of depression Trouble passing urine Side effects that usually do not require medical attention (report to your care team if they continue or are bothersome): Change in sex drive or performance Constipation Diarrhea Dizziness Dry mouth Excessive sweating Loss of appetite Nausea Vomiting This list may not describe all possible side effects. Call your doctor for medical advice about side effects. You may report side effects to FDA at 1-800-FDA-1088. Where should I keep my medication? Keep out of the reach of children and pets. Store at room temperature between 15 and 30 degrees C (59 to 86 degrees F). Get rid of any unused medication after the expiration date. To get rid of medications that are no longer needed or have expired: Take the medication to a medication take-back program. Check with your pharmacy or law enforcement to find a location. If you cannot return the medication, check the label or package insert to see if the medication should be thrown out in the garbage or flushed down the toilet. If you are not sure, ask your care team. If it is safe to put it in the trash, take the medication out of the container. Mix the medication with cat litter, dirt, coffee grounds, or other unwanted substance. Seal the mixture in a bag or container. Put it in the trash. NOTE: This sheet is a summary. It may not cover all possible information. If you have questions about this medicine, talk to your doctor, pharmacist, or health care provider.  2024 Elsevier/Gold Standard (2021-11-08 00:00:00)

## 2024-02-19 ENCOUNTER — Other Ambulatory Visit: Payer: Self-pay | Admitting: Cardiology

## 2024-02-26 ENCOUNTER — Other Ambulatory Visit: Payer: Self-pay | Admitting: Internal Medicine

## 2024-03-02 ENCOUNTER — Encounter: Payer: Self-pay | Admitting: Internal Medicine

## 2024-03-02 NOTE — Progress Notes (Unsigned)
" ° ° °  Subjective:    Patient ID: Susan Brady, female    DOB: 1945-02-28, 79 y.o.   MRN: [id number]      HPI Susan Brady is here for No chief complaint on file.        Medications and allergies reviewed with patient and updated if appropriate.  Medications Ordered Prior to Encounter[1]  Review of Systems     Objective:  There were no vitals filed for this visit. BP Readings from Last 3 Encounters:  01/13/24 118/64  11/21/23 122/64  06/05/23 128/69   Wt Readings from Last 3 Encounters:  01/13/24 183 lb 6.4 oz (83.2 kg)  11/21/23 186 lb (84.4 kg)  07/07/23 197 lb (89.4 kg)   There is no height or weight on file to calculate BMI.    Physical Exam         Assessment & Plan:    See Problem List for Assessment and Plan of chronic medical problems.         [1]  Current Outpatient Medications on File Prior to Visit  Medication Sig Dispense Refill   acetaminophen  (TYLENOL ) 500 MG tablet Take 500-1,000 mg by mouth every 6 (six) hours as needed for mild pain.     atorvastatin  (LIPITOR) 20 MG tablet TAKE 1 TABLET(20 MG) BY MOUTH DAILY 30 tablet 0   cholecalciferol (VITAMIN D) 1000 UNITS tablet Take 5,000 Units by mouth daily.      DULoxetine  (CYMBALTA ) 20 MG capsule Take 1 capsule (20 mg total) by mouth daily. 30 capsule 5   flecainide  (TAMBOCOR ) 150 MG tablet TAKE 1 TABLET(150 MG) BY MOUTH TWICE DAILY 180 tablet 3   furosemide  (LASIX ) 20 MG tablet TAKE 1 TABLET(20 MG) BY MOUTH TWICE DAILY. 180 tablet 3   Garlic 1000 MG CAPS Take 1,000 mg by mouth daily.     hydrALAZINE  (APRESOLINE ) 100 MG tablet TAKE 1 TABLET(100 MG) BY MOUTH THREE TIMES DAILY 270 tablet 1   metoprolol  succinate (TOPROL -XL) 25 MG 24 hr tablet Take 1 tablet (25 mg total) by mouth daily. 90 tablet 2   Omega-3 Fatty Acids (FISH OIL) 1000 MG CAPS Take 1 capsule by mouth daily.     pantoprazole  (PROTONIX ) 40 MG tablet TAKE 1 TABLET(40 MG) BY MOUTH DAILY 90 tablet 2   potassium chloride  SA (KLOR-CON  M)  20 MEQ tablet Take 1 tablet (20 mEq total) by mouth daily. 90 tablet 2   vitamin E 400 UNIT capsule Take 1,000 Units by mouth daily.      No current facility-administered medications on file prior to visit.   "

## 2024-03-03 ENCOUNTER — Ambulatory Visit (INDEPENDENT_AMBULATORY_CARE_PROVIDER_SITE_OTHER)

## 2024-03-03 ENCOUNTER — Ambulatory Visit: Payer: Self-pay | Admitting: Internal Medicine

## 2024-03-03 ENCOUNTER — Other Ambulatory Visit: Payer: Self-pay | Admitting: Internal Medicine

## 2024-03-03 ENCOUNTER — Ambulatory Visit: Admitting: Internal Medicine

## 2024-03-03 VITALS — BP 124/72 | HR 97 | Temp 99.3°F | Ht 67.0 in | Wt 167.0 lb

## 2024-03-03 DIAGNOSIS — R630 Anorexia: Secondary | ICD-10-CM

## 2024-03-03 DIAGNOSIS — R052 Subacute cough: Secondary | ICD-10-CM

## 2024-03-03 DIAGNOSIS — R634 Abnormal weight loss: Secondary | ICD-10-CM

## 2024-03-03 DIAGNOSIS — L723 Sebaceous cyst: Secondary | ICD-10-CM | POA: Diagnosis not present

## 2024-03-03 MED ORDER — ONDANSETRON HCL 4 MG PO TABS: 4.0000 mg | ORAL_TABLET | Freq: Three times a day (TID) | ORAL | 0 refills | Status: AC | PRN

## 2024-03-03 MED ORDER — AZITHROMYCIN 250 MG PO TABS: ORAL_TABLET | ORAL | 0 refills | Status: AC

## 2024-03-03 MED ORDER — CEFPODOXIME PROXETIL 200 MG PO TABS: 200.0000 mg | ORAL_TABLET | Freq: Two times a day (BID) | ORAL | 0 refills | Status: AC

## 2024-03-03 NOTE — Assessment & Plan Note (Signed)
 Acute Bump in her left posterior neck is a sebaceous cyst I was able to extract a small amount of contents No evidence of infection Advised him to monitor only

## 2024-03-03 NOTE — Assessment & Plan Note (Signed)
 Acute Started with the flu just before Christmas Also getting nauseous seeing food and smelling food Has had weight loss She is still eating, smaller meals and she is drinking high-protein Ensure twice a day I do think she probably has a secondary infection-community-acquired pneumonia so hopefully treating that will help Follow-up if no improvement

## 2024-03-03 NOTE — Patient Instructions (Addendum)
" ° ° ° ° °  Chest xray was ordered.       Medications changes include :   2 antibiotics, zofran       Return if symptoms worsen or fail to improve.  "

## 2024-03-03 NOTE — Assessment & Plan Note (Signed)
 Subacute Started just before December with what they think may have been the flu Symptoms have improved, but still has a persistent cough that is productive of some clear phlegm intermittently She has a low-grade fever here today, rhonchi on exam in addition to her decreased appetite, nausea and weight loss Chest x-ray did not show an infection, but clinically I am concerned she has community-acquired pneumonia-secondary to recent viral infection Start Vantin  200 mg twice daily x 10 days, Z-Pak Family will let me know if she is not improving

## 2024-03-03 NOTE — Assessment & Plan Note (Signed)
 Acute Started with the flu just before Christmas Has significantly decreased appetite and getting nauseous seeing food and smelling food She is still eating, smaller meals and she is drinking high-protein Ensure twice a day I do think she probably has a secondary infection-community-acquired pneumonia so hopefully treating that will help Follow-up if no improvement

## 2024-07-07 ENCOUNTER — Ambulatory Visit

## 2024-08-02 ENCOUNTER — Ambulatory Visit: Admitting: Adult Health
